# Patient Record
Sex: Female | Born: 1945 | Race: White | Hispanic: No | Smoking: Former smoker
Health system: Southern US, Community
[De-identification: ages and names within clinical notes are randomized; demographics above are authoritative.]

## PROBLEM LIST (undated history)

## (undated) DIAGNOSIS — M21611 Bunion of right foot: Secondary | ICD-10-CM

## (undated) DIAGNOSIS — Z85820 Personal history of malignant melanoma of skin: Secondary | ICD-10-CM

## (undated) DIAGNOSIS — M21612 Bunion of left foot: Secondary | ICD-10-CM

## (undated) DIAGNOSIS — E785 Hyperlipidemia, unspecified: Secondary | ICD-10-CM

## (undated) DIAGNOSIS — G4700 Insomnia, unspecified: Secondary | ICD-10-CM

## (undated) DIAGNOSIS — L719 Rosacea, unspecified: Secondary | ICD-10-CM

## (undated) DIAGNOSIS — M7540 Impingement syndrome of unspecified shoulder: Secondary | ICD-10-CM

## (undated) DIAGNOSIS — G479 Sleep disorder, unspecified: Secondary | ICD-10-CM

## (undated) DIAGNOSIS — K5120 Ulcerative (chronic) proctitis without complications: Secondary | ICD-10-CM

## (undated) DIAGNOSIS — C439 Malignant melanoma of skin, unspecified: Secondary | ICD-10-CM

## (undated) DIAGNOSIS — R32 Unspecified urinary incontinence: Secondary | ICD-10-CM

## (undated) DIAGNOSIS — M1990 Unspecified osteoarthritis, unspecified site: Secondary | ICD-10-CM

## (undated) DIAGNOSIS — I10 Essential (primary) hypertension: Secondary | ICD-10-CM

## (undated) DIAGNOSIS — K529 Noninfective gastroenteritis and colitis, unspecified: Secondary | ICD-10-CM

## (undated) DIAGNOSIS — M159 Polyosteoarthritis, unspecified: Secondary | ICD-10-CM

## (undated) DIAGNOSIS — K5100 Ulcerative (chronic) pancolitis without complications: Secondary | ICD-10-CM

## (undated) DIAGNOSIS — I890 Lymphedema, not elsewhere classified: Secondary | ICD-10-CM

## (undated) HISTORY — DX: Polyosteoarthritis, unspecified: M15.9

## (undated) HISTORY — DX: Unspecified urinary incontinence: R32

## (undated) HISTORY — PX: TONSILLECTOMY: SUR1361

## (undated) HISTORY — PX: BREAST BIOPSY: SHX20

## (undated) HISTORY — DX: Rosacea, unspecified: L71.9

## (undated) HISTORY — DX: Personal history of malignant melanoma of skin: Z85.820

## (undated) HISTORY — PX: ABDOMINAL HYSTERECTOMY: SHX81

## (undated) HISTORY — PX: CATARACT EXTRACTION W/ INTRAOCULAR LENS IMPLANT: SHX1309

## (undated) HISTORY — PX: OOPHORECTOMY: SHX86

## (undated) HISTORY — DX: Impingement syndrome of unspecified shoulder: M75.40

## (undated) HISTORY — DX: Bunion of left foot: M21.611

## (undated) HISTORY — PX: MELANOMA EXCISION: SHX5266

## (undated) HISTORY — DX: Sleep disorder, unspecified: G47.9

## (undated) HISTORY — DX: Bunion of right foot: M21.612

## (undated) HISTORY — PX: KNEE SURGERY: SHX244

## (undated) HISTORY — DX: Essential (primary) hypertension: I10

---
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
## 1974-11-27 HISTORY — PX: TUBAL LIGATION: SHX77

## 1987-11-28 HISTORY — PX: ABDOMINAL HYSTERECTOMY: SHX81

## 1987-11-28 HISTORY — PX: HYSTEROTOMY: SHX1776

## 1998-11-27 HISTORY — PX: MELANOMA EXCISION: SHX5266

## 2003-11-28 HISTORY — PX: ARTHROSCOPIC REPAIR ACL: SUR80

## 2010-02-06 ENCOUNTER — Ambulatory Visit: Payer: Self-pay | Admitting: Internal Medicine

## 2010-11-27 HISTORY — PX: OOPHORECTOMY: SHX86

## 2011-05-03 ENCOUNTER — Ambulatory Visit: Payer: Self-pay | Admitting: Ophthalmology

## 2011-06-14 ENCOUNTER — Ambulatory Visit: Payer: Self-pay | Admitting: Ophthalmology

## 2011-09-13 DIAGNOSIS — C439 Malignant melanoma of skin, unspecified: Secondary | ICD-10-CM

## 2011-09-13 HISTORY — DX: Malignant melanoma of skin, unspecified: C43.9

## 2013-03-17 DIAGNOSIS — M79673 Pain in unspecified foot: Secondary | ICD-10-CM | POA: Insufficient documentation

## 2016-07-14 DIAGNOSIS — M1712 Unilateral primary osteoarthritis, left knee: Secondary | ICD-10-CM | POA: Insufficient documentation

## 2016-11-27 HISTORY — PX: COLONOSCOPY: SHX5424

## 2017-01-13 ENCOUNTER — Ambulatory Visit
Admission: EM | Admit: 2017-01-13 | Discharge: 2017-01-13 | Disposition: A | Discharge status: Home / Self Care | Payer: Medicare Other | Attending: Family Medicine | Admitting: Family Medicine

## 2017-01-13 ENCOUNTER — Encounter: Payer: Self-pay | Admitting: Gynecology

## 2017-01-13 DIAGNOSIS — R05 Cough: Secondary | ICD-10-CM | POA: Diagnosis not present

## 2017-01-13 DIAGNOSIS — J0100 Acute maxillary sinusitis, unspecified: Secondary | ICD-10-CM

## 2017-01-13 DIAGNOSIS — R059 Cough, unspecified: Secondary | ICD-10-CM

## 2017-01-13 HISTORY — DX: Noninfective gastroenteritis and colitis, unspecified: K52.9

## 2017-01-13 HISTORY — DX: Insomnia, unspecified: G47.00

## 2017-01-13 HISTORY — DX: Ulcerative (chronic) proctitis without complications: K51.20

## 2017-01-13 HISTORY — DX: Malignant melanoma of skin, unspecified: C43.9

## 2017-01-13 HISTORY — DX: Hyperlipidemia, unspecified: E78.5

## 2017-01-13 HISTORY — DX: Unspecified osteoarthritis, unspecified site: M19.90

## 2017-01-13 MED ORDER — AMOXICILLIN 875 MG PO TABS: 875.0000 mg | ORAL_TABLET | Freq: Two times a day (BID) | ORAL | 0 refills | Status: DC

## 2017-01-13 NOTE — ED Provider Notes (Signed)
[facility]    CSN: [id number] Arrival date & time: 01/13/17  1125     History   Chief Complaint Chief Complaint  Patient presents with  . Cough    HPI Barbara Burgess is a 71 y.o. female.   The history is provided by the patient.  URI  Presenting symptoms: congestion, cough and fatigue   Severity:  Moderate Onset quality:  Sudden Duration:  2 weeks Timing:  Constant Progression:  Worsening Chronicity:  New Relieved by:  Nothing Ineffective treatments:  OTC medications Associated symptoms: headaches and sinus pain   Associated symptoms: no arthralgias, no myalgias and no wheezing   Risk factors: being elderly and sick contacts   Risk factors: no chronic cardiac disease, no chronic kidney disease, no chronic respiratory disease, no diabetes mellitus, no immunosuppression, no recent illness and no recent travel     Past Medical History:  Diagnosis Date  . Arthritis    left knee  . 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 Colitis   . Head ache   . Hyperlipemia   . Insomnia   . Melanoma ([place])   . Palpitation     There are no active problems to display for this patient.   Past Surgical History:  Procedure Laterality Date  . ABDOMINAL HYSTERECTOMY    . KNEE SURGERY    . MELANOMA EXCISION    . OOPHORECTOMY    . TONSILLECTOMY      OB History    No data available       Home Medications    Prior to Admission medications   Medication Sig Start Date End Date Taking? Authorizing Provider  aspirin EC 81 MG tablet Take 81 mg by mouth daily.   Yes Historical Provider, MD  diclofenac sodium (VOLTAREN) 1 % GEL Apply topically 4 (four) times daily.   Yes Historical Provider, MD  diphenhydrAMINE (BENADRYL ALLERGY) 25 mg capsule Take 25 mg by mouth every 6 (six) hours as needed.   Yes Historical Provider, MD  fluticasone (FLONASE) 50 MCG/ACT nasal spray Place into both nostrils daily.   Yes Historical Provider, MD  hydrochlorothiazide (HYDRODIURIL) 25  MG tablet Take 25 mg by mouth daily.   Yes Historical Provider, MD  mesalamine (CANASA) 1000 MG suppository Place 1,000 mg rectally at bedtime.   Yes Historical Provider, MD  metroNIDAZOLE (METROCREAM) 0.75 % cream Apply topically 2 (two) times daily.   Yes Historical Provider, MD  omega-3 acid ethyl esters (LOVAZA) 1 g capsule Take by mouth 2 (two) times daily.   Yes Historical Provider, MD  pravastatin (PRAVACHOL) 20 MG tablet Take 20 mg by mouth daily.   Yes Historical Provider, MD  Probiotic Product (ALIGN) 4 MG CAPS Take by mouth.   Yes Historical Provider, MD  traZODone (DESYREL) 100 MG tablet Take 100 mg by mouth at bedtime.   Yes Historical Provider, MD  amoxicillin (AMOXIL) 875 MG tablet Take 1 tablet (875 mg total) by mouth 2 (two) times daily. 01/13/17   Norval Gable, MD    Family History No family history on file.  Social History Social History  Substance Use Topics  . Smoking status: Never Smoker  . Smokeless tobacco: Never Used  . Alcohol use No     Allergies   Atorvastatin   Review of Systems Review of Systems  Constitutional: Positive for fatigue.  HENT: Positive for congestion and sinus pain.   Respiratory: Positive for cough. Negative for wheezing.   Musculoskeletal: Negative for arthralgias and  myalgias.  Neurological: Positive for headaches.     Physical Exam Triage Vital Signs ED Triage Vitals  Enc Vitals Group     BP 01/13/17 1151 122/69     Pulse Rate 01/13/17 1151 74     Resp 01/13/17 1151 16     Temp 01/13/17 1151 97.7 F (36.5 C)     Temp Source 01/13/17 1151 Oral     SpO2 01/13/17 1151 97 %     Weight 01/13/17 1153 148 lb (67.1 kg)     Height 01/13/17 1153 5' 4"  (1.626 m)     Head Circumference --      Peak Flow --      Pain Score 01/13/17 1200 2     Pain Loc --      Pain Edu? --      Excl. in [place]? --    No data found.   Updated Vital Signs BP 122/69 (BP Location: Left Arm)   Pulse 74   Temp 97.7 F (36.5 C) (Oral)   Resp 16    Ht 5' 4"  (1.626 m)   Wt 148 lb (67.1 kg)   SpO2 97%   BMI 25.40 kg/m   Visual Acuity Right Eye Distance:   Left Eye Distance:   Bilateral Distance:    Right Eye Near:   Left Eye Near:    Bilateral Near:     Physical Exam  Constitutional: She appears well-developed and well-nourished. No distress.  HENT:  Head: Normocephalic and atraumatic.  Right Ear: Tympanic membrane, external ear and ear canal normal.  Left Ear: Tympanic membrane, external ear and ear canal normal.  Nose: Mucosal edema and rhinorrhea present. No nose lacerations, sinus tenderness, nasal deformity, septal deviation or nasal septal hematoma. No epistaxis.  No foreign bodies. Right sinus exhibits maxillary sinus tenderness and frontal sinus tenderness. Left sinus exhibits maxillary sinus tenderness and frontal sinus tenderness.  Mouth/Throat: Uvula is midline, oropharynx is clear and moist and mucous membranes are normal. No oropharyngeal exudate.  Eyes: Conjunctivae and EOM are normal. Pupils are equal, round, and reactive to light. Right eye exhibits no discharge. Left eye exhibits no discharge. No scleral icterus.  Neck: Normal range of motion. Neck supple. No thyromegaly present.  Cardiovascular: Normal rate, regular rhythm and normal heart sounds.   Pulmonary/Chest: Effort normal and breath sounds normal. No respiratory distress. She has no wheezes. She has no rales.  Lymphadenopathy:    She has no cervical adenopathy.  Skin: She is not diaphoretic.  Nursing note and vitals reviewed.    UC Treatments / Results  Labs (all labs ordered are listed, but only abnormal results are displayed) Labs Reviewed - No data to display  EKG  EKG Interpretation None       Radiology No results found.  Procedures Procedures (including critical care time)  Medications Ordered in UC Medications - No data to display   Initial Impression / Assessment and Plan / UC Course  I have reviewed the triage vital signs  and the nursing notes.  Pertinent labs & imaging results that were available during my care of the patient were reviewed by me and considered in my medical decision making (see chart for details).       Final Clinical Impressions(s) / UC Diagnoses   Final diagnoses:  Acute maxillary sinusitis, recurrence not specified  Cough    New Prescriptions Discharge Medication List as of 01/13/2017 12:16 PM    START taking these medications   Details  amoxicillin (  AMOXIL) 875 MG tablet Take 1 tablet (875 mg total) by mouth 2 (two) times daily., Starting Sat 01/13/2017, Normal       1. diagnosis reviewed with patient 2. rx as per orders above; reviewed possible side effects, interactions, risks and benefits  3. Recommend supportive treatment with rest, fluids 4. Follow-up prn if symptoms worsen or don't improve   Norval Gable, MD 01/13/17 1229

## 2017-01-13 NOTE — ED Triage Notes (Signed)
Patient c/o cough / chest congestion / feeling week/ fatigue x couple weeks.

## 2017-11-27 HISTORY — PX: COLONOSCOPY: SHX174

## 2018-11-27 DIAGNOSIS — Z9289 Personal history of other medical treatment: Secondary | ICD-10-CM

## 2018-11-27 HISTORY — DX: Personal history of other medical treatment: Z92.89

## 2018-12-04 ENCOUNTER — Encounter: Payer: Self-pay | Admitting: Internal Medicine

## 2018-12-12 DIAGNOSIS — R911 Solitary pulmonary nodule: Secondary | ICD-10-CM

## 2018-12-12 HISTORY — DX: Solitary pulmonary nodule: R91.1

## 2018-12-20 ENCOUNTER — Encounter: Payer: Self-pay | Admitting: Internal Medicine

## 2018-12-20 ENCOUNTER — Ambulatory Visit (INDEPENDENT_AMBULATORY_CARE_PROVIDER_SITE_OTHER): Payer: Medicare PPO | Admitting: Internal Medicine

## 2018-12-20 VITALS — BP 130/70 | HR 75 | Temp 97.6°F | Ht 64.25 in | Wt 154.0 lb

## 2018-12-20 DIAGNOSIS — Z85820 Personal history of malignant melanoma of skin: Secondary | ICD-10-CM

## 2018-12-20 DIAGNOSIS — K5120 Ulcerative (chronic) proctitis without complications: Secondary | ICD-10-CM | POA: Insufficient documentation

## 2018-12-20 DIAGNOSIS — I10 Essential (primary) hypertension: Secondary | ICD-10-CM | POA: Diagnosis not present

## 2018-12-20 DIAGNOSIS — G479 Sleep disorder, unspecified: Secondary | ICD-10-CM

## 2018-12-20 DIAGNOSIS — M159 Polyosteoarthritis, unspecified: Secondary | ICD-10-CM | POA: Insufficient documentation

## 2018-12-20 DIAGNOSIS — M150 Primary generalized (osteo)arthritis: Secondary | ICD-10-CM

## 2018-12-20 DIAGNOSIS — E785 Hyperlipidemia, unspecified: Secondary | ICD-10-CM

## 2018-12-20 NOTE — Progress Notes (Signed)
Subjective:    Patient ID: Barbara Burgess, female    DOB: 1946/05/02, 73 y.o.   MRN: [id number]  HPI Here to establish care Will be moving to [facility] in 2 months---currently in [place]  Had advanced melanoma in 2012 Had stage 1 melanoma in 2000 Then 2012 had abdominal mass---felt to be recurrence of melanoma Had surgery then RT No clear evidence of recurrence Follows with Dr Harriet Masson  Known HTN On medications for that --- 15 years or so  Primary prevention with statin No myalgias or GI problems LFT's did go up on atorvastatin  Sleep issues chronically Trazodone helps  Ulcerative colitis for about 10 years Mild at first----mesalamine suppositories worked but then the price went sky high Tried off it---then bad flare last June Now on mesalamine orally Mostly had urgency, frequency and blood---all better Has to be careful with what she eats  Mild arthritis Mostly in knees and shoulders Recent PT for shoulders Exercises regularly ---swim, water aerobics, weights  Current Outpatient Medications on File Prior to Visit  Medication Sig Dispense Refill  . amLODipine (NORVASC) 5 MG tablet Take 1 tablet by mouth daily.    Marland Kitchen b complex vitamins tablet Take 1 tablet by mouth daily.    . diclofenac sodium (VOLTAREN) 1 % GEL Apply topically 4 (four) times daily.    . diphenhydrAMINE (BENADRYL ALLERGY) 25 mg capsule Take 25 mg by mouth every 6 (six) hours as needed.    . fluticasone (FLONASE) 50 MCG/ACT nasal spray Place into both nostrils daily.    . mesalamine (LIALDA) 1.2 g EC tablet Take 4 tablets by mouth daily.    . metroNIDAZOLE (METROCREAM) 0.75 % cream Apply topically 2 (two) times daily.    . pravastatin (PRAVACHOL) 40 MG tablet Take 1 tablet by mouth daily.    . Probiotic Product (ALIGN) 4 MG CAPS Take by mouth.    . traZODone (DESYREL) 100 MG tablet Take 100 mg by mouth at bedtime.    . TURMERIC PO Take by mouth.     No current facility-administered medications on  file prior to visit.     Allergies  Allergen Reactions  . Atorvastatin     Past Medical History:  Diagnosis Date  . Arthritis    left knee  . 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 Colitis   . History of melanoma   . Hyperlipemia   . Insomnia   . Melanoma [facility])     Past Surgical History:  Procedure Laterality Date  . ABDOMINAL HYSTERECTOMY    . KNEE SURGERY    . MELANOMA EXCISION    . OOPHORECTOMY     along with melanoma mass  . TONSILLECTOMY      History reviewed. No pertinent family history.  Social History   Socioeconomic History  . Marital status: Widowed    Spouse name: Not on file  . Number of children: 2  . Years of education: Not on file  . Highest education level: Not on file  Occupational History  . 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 Financial resource strain: Not on file  . Food insecurity:    Worry: Not on file    Inability: Not on file  . Transportation needs:    Medical: Not on file    Non-medical: Not on file  Tobacco Use  . Smoking status: Never Smoker  . Smokeless tobacco: Never Used  Substance and Sexual Activity  . Alcohol use: Not  on file    Comment: occasional alcohol  . Drug use: Not on file  . Sexual activity: Not on file  Lifestyle  . Physical activity:    Days per week: Not on file    Minutes per session: Not on file  . Stress: Not on file  Relationships  . Social connections:    Talks on phone: Not on file    Gets together: Not on file    Attends religious service: Not on file    Active member of club or organization: Not on file    Attends meetings of clubs or organizations: Not on file    Relationship status: Not on file  . Intimate partner violence:    Fear of current or ex partner: Not on file    Emotionally abused: Not on file    Physically abused: Not on file    Forced sexual activity: Not on file  Other Topics Concern  . Not on file  Social History Narrative  . Not on file    Review of Systems  Constitutional: Negative for fatigue.       Had lost 40# after husband died--stable now Wears seat belt  HENT: Negative for dental problem and hearing loss.   Eyes: Negative for visual disturbance.       No diplopia or unilateral vision loss  Respiratory: Negative for cough, chest tightness and shortness of breath.   Cardiovascular: Negative for chest pain, palpitations and leg swelling.  Gastrointestinal: Negative for abdominal pain.       No heartburn  Endocrine: Negative for polydipsia and polyuria.  Genitourinary: Negative for difficulty urinating and hematuria.  Musculoskeletal: Positive for arthralgias and back pain. Negative for joint swelling.       Bunions and hammer toes  Skin: Negative for rash.  Allergic/Immunologic: Negative for environmental allergies and immunocompromised state.  Neurological: Negative for dizziness, syncope, light-headedness and headaches.  Hematological: Negative for adenopathy. Does not bruise/bleed easily.  Psychiatric/Behavioral: Negative for dysphoric mood. The patient is not nervous/anxious.        Some sleep issues with upcoming move       Objective:   Physical Exam  Constitutional: She is oriented to person, place, and time. She appears well-developed. No distress.  HENT:  Mouth/Throat: Oropharynx is clear and moist. No oropharyngeal exudate.  Neck: No thyromegaly present.  Cardiovascular: Normal rate, regular rhythm, normal heart sounds and intact distal pulses. Exam reveals no gallop.  No murmur heard. Respiratory: Effort normal and breath sounds normal. No respiratory distress. She has no wheezes. She has no rales.  GI: Soft. There is no abdominal tenderness.  Musculoskeletal:        General: No tenderness or edema.  Lymphadenopathy:    She has no cervical adenopathy.  Neurological: She is alert and oriented to person, place, and time.  Skin: No rash noted. No erythema.  Psychiatric: She has a normal mood and  affect. Her behavior is normal.           Assessment & Plan:

## 2018-12-20 NOTE — Assessment & Plan Note (Signed)
Statin for primary prevention

## 2018-12-20 NOTE — Assessment & Plan Note (Signed)
Regular follow up with Dr Harriet Masson

## 2018-12-20 NOTE — Assessment & Plan Note (Signed)
BP Readings from Last 3 Encounters:  12/20/18 130/70  01/13/17 122/69   Good control Recent blood work

## 2018-12-20 NOTE — Assessment & Plan Note (Signed)
Doing well now on the mesalamine

## 2018-12-20 NOTE — Assessment & Plan Note (Signed)
Does okay with the trazodone Takes benedryl also--counseled about this

## 2018-12-20 NOTE — Assessment & Plan Note (Signed)
Mild in hands/shoulders

## 2019-02-17 MED ORDER — PRAVASTATIN SODIUM 40 MG PO TABS: 40.0000 mg | ORAL_TABLET | Freq: Every day | ORAL | 3 refills | Status: DC

## 2019-02-19 MED ORDER — AMLODIPINE BESYLATE 5 MG PO TABS: 5.0000 mg | ORAL_TABLET | Freq: Every day | ORAL | 3 refills | Status: DC

## 2019-03-14 MED ORDER — TRAZODONE HCL 100 MG PO TABS: 100.0000 mg | ORAL_TABLET | Freq: Every day | ORAL | 3 refills | Status: DC

## 2019-05-02 ENCOUNTER — Other Ambulatory Visit: Payer: Self-pay | Admitting: Internal Medicine

## 2019-05-02 DIAGNOSIS — Z1231 Encounter for screening mammogram for malignant neoplasm of breast: Secondary | ICD-10-CM

## 2019-06-05 ENCOUNTER — Other Ambulatory Visit: Payer: Self-pay

## 2019-06-05 ENCOUNTER — Ambulatory Visit
Admission: RE | Admit: 2019-06-05 | Discharge: 2019-06-05 | Disposition: A | Discharge status: Home / Self Care | Payer: Medicare PPO | Source: Ambulatory Visit | Attending: Internal Medicine | Admitting: Internal Medicine

## 2019-06-05 DIAGNOSIS — Z1231 Encounter for screening mammogram for malignant neoplasm of breast: Secondary | ICD-10-CM | POA: Diagnosis present

## 2019-12-04 ENCOUNTER — Telehealth: Payer: Self-pay | Admitting: Internal Medicine

## 2019-12-04 NOTE — Telephone Encounter (Signed)
Patient called to cancel her cpx at the end of this month.  Patient said she's switching to [facility].

## 2019-12-04 NOTE — Telephone Encounter (Signed)
That is fine They are my successor group as Market researcher and have an NP on campus several times a week

## 2019-12-09 ENCOUNTER — Ambulatory Visit: Payer: Self-pay | Admitting: Nurse Practitioner

## 2019-12-11 ENCOUNTER — Ambulatory Visit: Payer: Self-pay | Admitting: Nurse Practitioner

## 2019-12-16 ENCOUNTER — Ambulatory Visit: Payer: Medicare PPO | Admitting: Nurse Practitioner

## 2019-12-16 ENCOUNTER — Other Ambulatory Visit: Payer: Self-pay

## 2019-12-16 ENCOUNTER — Encounter: Payer: Self-pay | Admitting: Nurse Practitioner

## 2019-12-16 VITALS — BP 118/76 | HR 89 | Temp 97.8°F | Resp 18 | Ht 64.0 in | Wt 158.0 lb

## 2019-12-16 DIAGNOSIS — I10 Essential (primary) hypertension: Secondary | ICD-10-CM

## 2019-12-16 DIAGNOSIS — E2839 Other primary ovarian failure: Secondary | ICD-10-CM

## 2019-12-16 DIAGNOSIS — L719 Rosacea, unspecified: Secondary | ICD-10-CM | POA: Diagnosis not present

## 2019-12-16 DIAGNOSIS — K51819 Other ulcerative colitis with unspecified complications: Secondary | ICD-10-CM | POA: Diagnosis not present

## 2019-12-16 DIAGNOSIS — M1990 Unspecified osteoarthritis, unspecified site: Secondary | ICD-10-CM | POA: Insufficient documentation

## 2019-12-16 DIAGNOSIS — F5101 Primary insomnia: Secondary | ICD-10-CM | POA: Diagnosis not present

## 2019-12-16 DIAGNOSIS — E785 Hyperlipidemia, unspecified: Secondary | ICD-10-CM

## 2019-12-16 DIAGNOSIS — M159 Polyosteoarthritis, unspecified: Secondary | ICD-10-CM

## 2019-12-16 DIAGNOSIS — M8949 Other hypertrophic osteoarthropathy, multiple sites: Secondary | ICD-10-CM

## 2019-12-16 DIAGNOSIS — Z85820 Personal history of malignant melanoma of skin: Secondary | ICD-10-CM

## 2019-12-16 DIAGNOSIS — G4700 Insomnia, unspecified: Secondary | ICD-10-CM | POA: Insufficient documentation

## 2019-12-16 NOTE — Progress Notes (Signed)
Careteam: Patient Care Team: Lauree Chandler, NP as PCP - General (Geriatric Medicine) Audrie Lia, MD as Referring Physician (Dermatology) Brain Hilts, MD as Referring Physician (Gastroenterology) [facility], [facility])  Advanced Directive information Does Patient Have a Medical Advance Directive?: Yes, Type of Advance Directive: [place];Living will, Does patient want to make changes to medical advance directive?: No - Patient declined  Allergies  Allergen Reactions  . Atorvastatin     Elevated Liver Enzymes.     Chief Complaint  Patient presents with  . Establish Care    New Patient     HPI: Patient is a 74 y.o. female seen in today at the [facility] to establish care.   Ulcerative coloitis- originally was mild-moderate but ~3 years ago became severe usually controlled, follows with GI. Currently taking mesalamine 1.2 mg tablet 4 tablets daily.  Getting colonoscopy yearly   Rosaceas- controlled metronidazole cream daily, prescribed by dermatologist.    Hyperlipidemia- pravastatin 40 mg daily - has been a least a year since checked, she reports cholesterol has been controlled in the past.   Hypertension- controlled on norvasc 5 mg daily   Insomnia- controlled on trazodone 100 mg daily and benadryl 25 mg daily.   Supplement- uses turmeric for inflammation   Shoulder impingement/left hip/OA- alternates tylenol with advil, following with ortho, going through PT.  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   Lived in [place] [facility] years  Melanoma, stage 4- originally had stage 1 melaoma in 2000, then in 2012 found melanoma on the ovary. Completed radiation therapy. Having chest and abdominal scans yearly but now feels like she is far enough out that they are not doing it.  following with oncologist and dermatologist.   Lost 40 lbs after her husband died (had put  on some after he passed) had more trouble in her knees so decided to change diet and lose weight. Moved to [place] and gained weight. On the [place] again and UC flared, did steroids and that calmed it down then flared again. She decided she needed to change diet again which has helped. In the last 2 weeks has been very strict abt limiting foods.  No abdominal pain, no blood.    Review of Systems:  Review of Systems  Constitutional: Negative for chills, fever and weight loss.  Respiratory: Negative for cough, sputum production and shortness of breath.   Cardiovascular: Negative for chest pain, palpitations and leg swelling.  Gastrointestinal: Negative for abdominal pain, constipation, diarrhea and heartburn.  Genitourinary: Negative for dysuria, frequency and urgency.  Musculoskeletal: Positive for back pain, joint pain and myalgias. Negative for falls.  Skin: Negative.   Neurological: Negative for dizziness and headaches.  Psychiatric/Behavioral: Negative for depression and memory loss. The patient does not have insomnia.     Past Medical History:  Diagnosis Date  . Arthritis    knee and hip,  per [facility] New Patient Packet.   . Bilateral bunions   . 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   . Colitis   . H/O CT scan of chest 11/27/2018   By Berle Mull, per [facility] New Patient Packet.   . H/O mammogram 11/27/2018   By Arvilla Market at [facility], per [facility] New Patient Packet.   Marland Kitchen History of CT scan of abdomen 11/27/2018   By Joaquim Lai  Collichio, per [facility] New Patient Packet.   Marland Kitchen History of melanoma     per [facility] New Patient Packet.   Marland Kitchen History of upper extremity x-ray 11/27/2018   By Bridgette Habermann, of Shoulder, per [facility] New Patient Packet.   Marland Kitchen History of x-ray of hip 11/27/2018   By Bridgette Habermann, of Hip, per [facility] New Patient Packet.   . Hyperlipemia     per [facility] New Patient Packet.   . Hypertension     per [facility] New Patient Packet.   . Impingement  syndrome of shoulder     per [facility] New Patient Packet.   . Incontinence    Mild Nighttime per [facility] New Patient Packet.  . Insomnia   . Melanoma ([place])   . Osteoarthritis of multiple joints   . Rosacea     per [facility] New Patient Packet.   . Sleep disorder    Past Surgical History:  Procedure Laterality Date  . ABDOMINAL HYSTERECTOMY  11/28/1987   Partial per [facility] New Patient Packet  . ARTHROSCOPIC REPAIR ACL Left 11/28/2003   Left Knee/ Torn Medial Meniscus per [facility] New Patient Packet  . BREAST BIOPSY    . CATARACT EXTRACTION W/ INTRAOCULAR LENS IMPLANT Bilateral   . COLONOSCOPY  11/27/2016   By Elspeth Cho,  per [facility] New Patient Packet.   . COLONOSCOPY  11/27/2017   By Elspeth Cho, per [facility] New Patient Packet.   Marland Kitchen HYSTEROTOMY  11/28/1987   per [facility] New Patient Packet  . KNEE SURGERY    . MELANOMA EXCISION  11/27/1998   Right Shin per [facility] New Patient Packet  . OOPHORECTOMY  11/27/2010   along with melanoma mass per [facility] New Patient Packet  . TONSILLECTOMY  11/27/1950   per [facility] New Patient Packet   Social History:   reports that she quit smoking about 30 years ago. Her smoking use included cigarettes. She smoked 1.00 pack per day. She has never used smokeless tobacco. She reports current alcohol use of about 4.0 - 5.0 standard drinks of alcohol per week. She reports that she does not use drugs.  Family History  Problem Relation Age of Onset  . Alzheimer's disease Mother   . Heart disease Father   . Lung cancer Maternal Grandfather   . Heart attack Paternal Grandfather   . Diabetes Neg Hx     Medications: Patient's Medications  New Prescriptions   No medications on file  Previous Medications   AMLODIPINE (NORVASC) 5 MG TABLET    Take 1 tablet (5 mg total) by mouth daily.   B COMPLEX VITAMINS TABLET    Take 1 tablet by mouth daily.   DICLOFENAC SODIUM (VOLTAREN) 1 % GEL    Apply topically as needed.    DIPHENHYDRAMINE (BENADRYL ALLERGY) 25 MG CAPSULE    Take 25 mg by mouth  daily.    IBUPROFEN PO    Take by mouth as needed.   MESALAMINE PO    Take 4.8 g by mouth daily.   METRONIDAZOLE (METROCREAM) 0.75 % CREAM    Apply 1 application topically daily.    PRAVASTATIN (PRAVACHOL) 40 MG TABLET    Take 1 tablet (40 mg total) by mouth daily.   TRAZODONE (DESYREL) 100 MG TABLET    Take 1 tablet (100 mg total) by mouth at bedtime.   TURMERIC 500 MG CAPS    Take 1 capsule by mouth 2 (two) times daily.  Modified Medications   No medications on file  Discontinued Medications  No medications on file    Physical Exam:  Vitals:   12/16/19 1321  BP: 118/76  Pulse: 89  Resp: 18  Temp: 97.8 F (36.6 C)  SpO2: 98%  Weight: 158 lb (71.7 kg)  Height: 5' 4"  (1.626 m)   Body mass index is 27.12 kg/m. Wt Readings from Last 3 Encounters:  12/16/19 158 lb (71.7 kg)  12/20/18 154 lb (69.9 kg)  01/13/17 148 lb (67.1 kg)    Physical Exam Constitutional:      General: She is not in acute distress.    Appearance: She is well-developed. She is not diaphoretic.  HENT:     Head: Normocephalic and atraumatic.     Mouth/Throat:     Pharynx: No oropharyngeal exudate.  Eyes:     Conjunctiva/sclera: Conjunctivae normal.     Pupils: Pupils are equal, round, and reactive to light.  Cardiovascular:     Rate and Rhythm: Normal rate and regular rhythm.     Heart sounds: Normal heart sounds.  Pulmonary:     Effort: Pulmonary effort is normal.     Breath sounds: Normal breath sounds.  Abdominal:     General: Bowel sounds are normal.     Palpations: Abdomen is soft.  Musculoskeletal:        General: No tenderness.     Cervical back: Normal range of motion and neck supple.  Skin:    General: Skin is warm and dry.  Neurological:     Mental Status: She is alert and oriented to person, place, and time.     Labs reviewed: Basic Metabolic Panel: No results for input(s): NA, K, CL, CO2, GLUCOSE, BUN, CREATININE, CALCIUM, MG, PHOS, TSH in the last 8760 hours. Liver  Function Tests: No results for input(s): AST, ALT, ALKPHOS, BILITOT, PROT, ALBUMIN in the last 8760 hours. No results for input(s): LIPASE, AMYLASE in the last 8760 hours. No results for input(s): AMMONIA in the last 8760 hours. CBC: No results for input(s): WBC, NEUTROABS, HGB, HCT, MCV, PLT in the last 8760 hours. Lipid Panel: No results for input(s): CHOL, HDL, LDLCALC, TRIG, CHOLHDL, LDLDIRECT in the last 8760 hours. TSH: No results for input(s): TSH in the last 8760 hours. A1C: No results found for: HGBA1C   Assessment/Plan 1. Estrogen deficiency - DG Bone Density; Future  2. Hx of melanoma  -following routinely with oncologist and dermatologist, recently stop her yearly scans of abdomen and chest due to no recurrence.   3. Other ulcerative colitis with complication ([place]) -has modified diet which has helped symptoms. Following with GI.   4. Primary insomnia Has been taking trazodone with benadryl. To stop benadryl due to side effect.  5. Rosacea -followed by dermatology. Continues on metronidazole 0.75% cream.  6. Essential hypertension Controlled on Norvasc 5 mg by mouth daily   7. Primary osteoarthritis involving multiple joints Working with PT due to hip and shoulder pain. Follows with ortho. Advised to avoid NSAIDS   8. Hyperlipidemia, unspecified hyperlipidemia type Will follow up lipids and update CMP. Continues on pravastatin and dietary modifications.    Next appt: 6 weeks for AWV and EV Rabecka Brendel K. [place], [facility] [phone]

## 2019-12-18 LAB — BASIC METABOLIC PANEL
BUN: 15 (ref 4–21)
CO2: 24 — AB (ref 13–22)
Chloride: 107 (ref 99–108)
Creatinine: 0.7 (ref 0.5–1.1)
Glucose: 88
Potassium: 5.1 (ref 3.4–5.3)
Sodium: 143 (ref 137–147)

## 2019-12-18 LAB — CBC: RBC: 4.91 (ref 3.87–5.11)

## 2019-12-18 LAB — LIPID PANEL
Cholesterol: 229 — AB (ref 0–200)
LDL Cholesterol: 133
Triglycerides: 121 (ref 40–160)

## 2019-12-18 LAB — HEPATIC FUNCTION PANEL
ALT: 45 — AB (ref 7–35)
AST: 30 (ref 13–35)
Alkaline Phosphatase: 97 (ref 25–125)
Bilirubin, Total: 0.5

## 2019-12-18 LAB — CBC AND DIFFERENTIAL
HCT: 45 (ref 36–46)
Hemoglobin: 14.7 (ref 12.0–16.0)
Neutrophils Absolute: 2769
Platelets: 23 — AB (ref 150–399)
WBC: 5.1

## 2019-12-18 LAB — COMPREHENSIVE METABOLIC PANEL
Albumin: 4.3 (ref 3.5–5.0)
GFR calc Af Amer: 101
GFR calc non Af Amer: 87
Globulin: 2.1

## 2019-12-23 ENCOUNTER — Encounter: Payer: Medicare PPO | Admitting: Internal Medicine

## 2020-01-08 ENCOUNTER — Encounter: Payer: Self-pay | Admitting: Podiatry

## 2020-01-08 ENCOUNTER — Ambulatory Visit (INDEPENDENT_AMBULATORY_CARE_PROVIDER_SITE_OTHER): Payer: Medicare PPO | Admitting: Podiatry

## 2020-01-08 ENCOUNTER — Other Ambulatory Visit: Payer: Self-pay

## 2020-01-08 DIAGNOSIS — Q828 Other specified congenital malformations of skin: Secondary | ICD-10-CM | POA: Insufficient documentation

## 2020-01-08 DIAGNOSIS — M2010 Hallux valgus (acquired), unspecified foot: Secondary | ICD-10-CM

## 2020-01-08 NOTE — Progress Notes (Signed)
This patient presents to the office with chief complaint of a painful callus under the ball of her right foot.  She says she has developed this callus which is become increasingly painful for the last few months.  Patient states that she has moved from [place] and her previous podiatrist recommended callus care in addition to wearing orthoses.  Patient is not interested in surgical correction of her bunions.  She request treatment to eliminate pain from her callus right forefoot.  She is also interested in having her previous orthoses examined.    She states that she may need a new pair of orthoses.  Finally she does not need nail care today she presents the office today for evaluation and treatment of her right forefoot and orthoses.  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    Vascular  Dorsalis pedis and posterior tibial pulses are palpable  B/L.  Capillary return  WNL.  Temperature gradient is  WNL.  Skin turgor  WNL  Sensorium  Senn Weinstein monofilament wire  WNL. Normal tactile sensation.  Nail Exam  Patient has normal nails with no evidence of bacterial or fungal infection.  Orthopedic  Exam  Muscle tone and muscle strength  WNL.  No limitations of motion feet  B/L.  No crepitus or joint effusion noted.  Foot type is unremarkable and digits show no abnormalities.  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  Plantar flexed second metatarsal right foot.    Skin  No open lesions.  Normal skin texture and turgor.  Porokeratosis sub 2 right foot.  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  IE.  Debrided porokeratosis right forefoot.  Examined her orthoses which were made of Plastizote with a metatarsal pad for forefoot relief for her porokeratosis.  Told the patient that this insole was adequate but I believe in the near future she needs to consider a new orthoses with possibly a localized off weightbearing site subtwo right  foot.  RTC 4 months.   Gardiner Barefoot DPM

## 2020-01-27 ENCOUNTER — Ambulatory Visit (INDEPENDENT_AMBULATORY_CARE_PROVIDER_SITE_OTHER): Payer: Medicare PPO | Admitting: Nurse Practitioner

## 2020-01-27 ENCOUNTER — Other Ambulatory Visit: Payer: Self-pay

## 2020-01-27 ENCOUNTER — Encounter: Payer: Self-pay | Admitting: Nurse Practitioner

## 2020-01-27 VITALS — BP 118/76 | HR 103 | Temp 97.8°F | Resp 16 | Ht 64.0 in | Wt 160.0 lb

## 2020-01-27 VITALS — BP 118/76 | HR 72 | Temp 97.8°F | Resp 16 | Ht 64.0 in | Wt 160.0 lb

## 2020-01-27 DIAGNOSIS — Z0000 Encounter for general adult medical examination without abnormal findings: Secondary | ICD-10-CM

## 2020-01-27 DIAGNOSIS — I10 Essential (primary) hypertension: Secondary | ICD-10-CM

## 2020-01-27 DIAGNOSIS — F5101 Primary insomnia: Secondary | ICD-10-CM

## 2020-01-27 DIAGNOSIS — M8949 Other hypertrophic osteoarthropathy, multiple sites: Secondary | ICD-10-CM

## 2020-01-27 DIAGNOSIS — E785 Hyperlipidemia, unspecified: Secondary | ICD-10-CM

## 2020-01-27 DIAGNOSIS — L719 Rosacea, unspecified: Secondary | ICD-10-CM

## 2020-01-27 DIAGNOSIS — M150 Primary generalized (osteo)arthritis: Secondary | ICD-10-CM

## 2020-01-27 DIAGNOSIS — M159 Polyosteoarthritis, unspecified: Secondary | ICD-10-CM

## 2020-01-27 MED ORDER — AMLODIPINE BESYLATE 5 MG PO TABS: 5.0000 mg | ORAL_TABLET | Freq: Every day | ORAL | 3 refills | Status: DC

## 2020-01-27 MED ORDER — TRAZODONE HCL 100 MG PO TABS: 100.0000 mg | ORAL_TABLET | Freq: Every day | ORAL | 3 refills | Status: AC

## 2020-01-27 MED ORDER — PRAVASTATIN SODIUM 40 MG PO TABS: 40.0000 mg | ORAL_TABLET | Freq: Every day | ORAL | 3 refills | Status: DC

## 2020-01-27 NOTE — Patient Instructions (Signed)
Barbara Burgess , Thank you for taking time to come for your Medicare Wellness Visit. I appreciate your ongoing commitment to your health goals. Please review the following plan we discussed and let me know if I can assist you in the future.   Screening recommendations/referrals: Colonoscopy - through GI Mammogram up to date Bone Density orders on file- will follow up with this.  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 What does preventive care include?  A yearly physical exam. This is also called an annual well check.  Dental exams once or twice a year.  Routine eye exams. Ask your health care provider how often you should have your eyes checked.  Personal lifestyle choices, including:  Daily care of your teeth and gums.  Regular physical activity.  Eating a healthy diet.  Avoiding tobacco and drug use.  Limiting alcohol use.  Practicing safe sex.  Taking low-dose aspirin every day.  Taking vitamin and mineral supplements as recommended by your health care provider. What happens during an annual well check? The services and screenings done by your health care provider during your annual well check will depend on your age, overall health, lifestyle risk factors, and family history of disease. Counseling  Your health care provider may ask you questions about your:  Alcohol use.  Tobacco use.  Drug use.  Emotional  well-being.  Home and relationship well-being.  Sexual activity.  Eating habits.  History of falls.  Memory and ability to understand (cognition).  Work and work Statistician.  Reproductive health. Screening  You may have the following tests or measurements:  Height, weight, and BMI.  Blood pressure.  Lipid and cholesterol levels. These may be checked every 5 years, or more frequently if you are over 54 years old.  Skin check.  Lung cancer screening. You may have this screening every year starting at age 31 if you have a 30-pack-year history of smoking and currently smoke or have quit within the past 15 years.  Fecal occult blood test (FOBT) of the stool. You may have this test every year starting at age 28.  Flexible sigmoidoscopy or colonoscopy. You may have a sigmoidoscopy every 5 years or a colonoscopy every 10 years starting at age 7.  Hepatitis C blood test.  Hepatitis B blood test.  Sexually transmitted disease (STD) testing.  Diabetes screening. This is done by checking your blood sugar (glucose) after you have not eaten for a while (fasting). You may have this done every 1-3 years.  Bone density scan. This is done to screen for osteoporosis. You may have this done starting at age 2.  Mammogram. This may be done every 1-2 years. Talk to your health care provider about how often you should have regular mammograms. Talk with your health care provider about your test results, treatment options, and if necessary, the need for more tests. Vaccines  Your health care provider may recommend certain vaccines, such as:  Influenza vaccine. This is recommended every year.  Tetanus, diphtheria,  and acellular pertussis (Tdap, Td) vaccine. You may need a Td booster every 10 years.  Zoster vaccine. You may need this after age 47.  Pneumococcal 13-valent conjugate (PCV13) vaccine. One dose is recommended after age 24.  Pneumococcal polysaccharide (PPSV23) vaccine. One  dose is recommended after age 25. Talk to your health care provider about which screenings and vaccines you need and how often you need them. This information is not intended to replace advice given to you by your health care provider. Make sure you discuss any questions you have with your health care provider. Document Released: 12/10/2015 Document Revised: 08/02/2016 Document Reviewed: 09/14/2015 Elsevier Interactive Patient Education  2017 [employer] Prevention in the Home Falls can cause injuries. They can happen to people of all ages. There are many things you can do to make your home safe and to help prevent falls. What can I do on the outside of my home?  Regularly fix the edges of walkways and driveways and fix any cracks.  Remove anything that might make you trip as you walk through a door, such as a raised step or threshold.  Trim any bushes or trees on the path to your home.  Use bright outdoor lighting.  Clear any walking paths of anything that might make someone trip, such as rocks or tools.  Regularly check to see if handrails are loose or broken. Make sure that both sides of any steps have handrails.  Any raised decks and porches should have guardrails on the edges.  Have any leaves, snow, or ice cleared regularly.  Use sand or salt on walking paths during winter.  Clean up any spills in your garage right away. This includes oil or grease spills. What can I do in the bathroom?  Use night lights.  Install grab bars by the toilet and in the tub and shower. Do not use towel bars as grab bars.  Use non-skid mats or decals in the tub or shower.  If you need to sit down in the shower, use a plastic, non-slip stool.  Keep the floor dry. Clean up any water that spills on the floor as soon as it happens.  Remove soap buildup in the tub or shower regularly.  Attach bath mats securely with double-sided non-slip rug tape.  Do not have throw rugs and other  things on the floor that can make you trip. What can I do in the bedroom?  Use night lights.  Make sure that you have a light by your bed that is easy to reach.  Do not use any sheets or blankets that are too big for your bed. They should not hang down onto the floor.  Have a firm chair that has side arms. You can use this for support while you get dressed.  Do not have throw rugs and other things on the floor that can make you trip. What can I do in the kitchen?  Clean up any spills right away.  Avoid walking on wet floors.  Keep items that you use a lot in easy-to-reach places.  If you need to reach something above you, use a strong step stool that has a grab bar.  Keep electrical cords out of the way.  Do not use floor polish or wax that makes floors slippery. If you must use wax, use non-skid floor wax.  Do not have throw rugs and other things on the floor that can make you trip. What can I do with my  stairs?  Do not leave any items on the stairs.  Make sure that there are handrails on both sides of the stairs and use them. Fix handrails that are broken or loose. Make sure that handrails are as long as the stairways.  Check any carpeting to make sure that it is firmly attached to the stairs. Fix any carpet that is loose or worn.  Avoid having throw rugs at the top or bottom of the stairs. If you do have throw rugs, attach them to the floor with carpet tape.  Make sure that you have a light switch at the top of the stairs and the bottom of the stairs. If you do not have them, ask someone to add them for you. What else can I do to help prevent falls?  Wear shoes that:  Do not have high heels.  Have rubber bottoms.  Are comfortable and fit you well.  Are closed at the toe. Do not wear sandals.  If you use a stepladder:  Make sure that it is fully opened. Do not climb a closed stepladder.  Make sure that both sides of the stepladder are locked into place.  Ask  someone to hold it for you, if possible.  Clearly mark and make sure that you can see:  Any grab bars or handrails.  First and last steps.  Where the edge of each step is.  Use tools that help you move around (mobility aids) if they are needed. These include:  Canes.  Walkers.  Scooters.  Crutches.  Turn on the lights when you go into a dark area. Replace any light bulbs as soon as they burn out.  Set up your furniture so you have a clear path. Avoid moving your furniture around.  If any of your floors are uneven, fix them.  If there are any pets around you, be aware of where they are.  Review your medicines with your doctor. Some medicines can make you feel dizzy. This can increase your chance of falling. Ask your doctor what other things that you can do to help prevent falls. This information is not intended to replace advice given to you by your health care provider. Make sure you discuss any questions you have with your health care provider. Document Released: 09/09/2009 Document Revised: 04/20/2016 Document Reviewed: 12/18/2014 Elsevier Interactive Patient Education  2017 [employer].

## 2020-01-27 NOTE — Progress Notes (Signed)
Provider: Lauree Chandler, NP  Patient Care Team: Lauree Chandler, NP as PCP - General (Geriatric Medicine) Audrie Lia, MD as Referring Physician (Dermatology) Brain Hilts, MD as Referring Physician (Gastroenterology) [place], [facility])  Extended Emergency Contact Information Primary Emergency Contact: Michael [place] Phone: [phone] Relation: Daughter Secondary Emergency Contact: [facility] Phone: [phone] Relation: Friend Allergies  Allergen Reactions  . Atorvastatin     Elevated Liver Enzymes.    Code Status: FULL Goals of Care: Advanced Directive information Advanced Directives 01/27/2020  Does Patient Have a Medical Advance Directive? -  Type of Paramedic of [place];Living will  Does patient want to make changes to medical advance directive? No - Patient declined  Copy of [place] in Chart? Yes - validated most recent copy scanned in chart (See row information)     Chief Complaint  Patient presents with  . Annual Exam    Physical Exam    PLACE OF SERVICE: [place]   HPI: Patient is a 74 y.o. female seen in today for an annual exam.   Completed AWV today She had her blood work drawn after visit in January but never heard anything. Had them done a week later.    Diet?has had to change diet due to colitis,  Nasty flare in November and went on steroids to bring it down. Got better but then flared up again. Now she has modified diet again and stopped a lot of fruits and vegetables due to this.  No raw fruits except for bananas  Used to eat salad every day for lunch More carbohydrates More cereal  Eating canned and cook fruits which has helped the gut Bowel movements have gone in frequency but not always formed.  Using food as a comfort  Stopped drinking all alcohol.  Exercise? Daily through PT and walking    Dentition: routinely every 6  months   Ophthalmology appt: eye care- goes yearly   Routine specialist: GI, podiatrist, dermatologist. Ortho for shoulder and hip - [facility].   Depression screen PHQ 2/9 01/27/2020  Decreased Interest 0  Down, Depressed, Hopeless 0  PHQ - 2 Score 0    Fall Risk  01/27/2020 01/27/2020 12/16/2019  Falls in the past year? 0 0 0  Number falls in past yr: 0 0 0  Injury with Fall? 0 0 0   MMSE - Mini Mental State Exam 01/27/2020  Orientation to time 5  Orientation to Place 5  Registration 3  Attention/ Calculation 5  Recall 3  Language- name 2 objects 2  Language- repeat 1  Language- follow 3 step command 3  Language- read & follow direction 1  Write a sentence 1  Copy design 1  Total score 30     Health Maintenance  Topic Date Due  . DEXA SCAN  03/02/2011  . COLONOSCOPY  05/27/2020 (Originally 06/26/2019)  . TETANUS/TDAP  03/01/2021  . MAMMOGRAM  06/04/2021  . INFLUENZA VACCINE  Completed  . Hepatitis C Screening  Completed  . PNA vac Low Risk Adult  Completed    Past Medical History:  Diagnosis Date  . Arthritis    knee and hip,  per [facility] Patient [facility].   . Bilateral bunions   . 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   . Colitis   . H/O CT scan of chest 11/27/2018   By Berle Mull,  per [facility] Patient Packet.   . H/O mammogram 11/27/2018   By Arvilla Market at [facility], per [facility] New Patient Packet.   Marland Kitchen History of CT scan of abdomen 11/27/2018   By Berle Mull, per [facility] Patient Packet.   Marland Kitchen History of melanoma     per [facility] Patient Packet.   Marland Kitchen History of upper extremity x-ray 11/27/2018   By Bridgette Habermann, of Shoulder, per [facility] New Patient Packet.   Marland Kitchen History of x-ray of hip 11/27/2018   By Bridgette Habermann, of Hip, per [facility] New Patient Packet.   . Hyperlipemia     per [facility] Patient Packet.   . Hypertension     per [facility] Patient Packet.   . Impingement syndrome of shoulder     per [facility] Patient Packet.   .  Incontinence    Mild Nighttime per [facility] New Patient Packet.  . Insomnia   . Melanoma ([place])   . Osteoarthritis of multiple joints   . Rosacea     per [facility] New Patient Packet.   . Sleep disorder     Past Surgical History:  Procedure Laterality Date  . ABDOMINAL HYSTERECTOMY  11/28/1987   Partial per [facility] New Patient Packet  . ARTHROSCOPIC REPAIR ACL Left 11/28/2003   Left Knee/ Torn Medial Meniscus per [facility] New Patient Packet  . BREAST BIOPSY    . CATARACT EXTRACTION W/ INTRAOCULAR LENS IMPLANT Bilateral   . COLONOSCOPY  11/27/2016   By Elspeth Cho,  per [facility] New Patient Packet.   . COLONOSCOPY  11/27/2017   By Elspeth Cho, per [facility] New Patient Packet.   Marland Kitchen HYSTEROTOMY  11/28/1987   per [facility] New Patient Packet  . KNEE SURGERY    . MELANOMA EXCISION  11/27/1998   Right Shin per [facility] New Patient Packet  . OOPHORECTOMY  11/27/2010   along with melanoma mass per [facility] New Patient Packet  . TONSILLECTOMY  11/27/1950   per [facility] New Patient Packet    Social History   Socioeconomic History  . Marital status: Widowed    Spouse name: Not on file  . Number of children: 2  . Years of education: Not on file  . Highest education level: Not on file  Occupational History  . Occupation: Scientist, physiological    Comment: Retired  Tobacco Use  . Smoking status: Former Smoker    Packs/day: 1.00    Types: Cigarettes    Quit date: 11/27/1989    Years since quitting: 30.1  . Smokeless tobacco: Never Used  Substance and Sexual Activity  . Alcohol use: Yes    Comment: 1 Drink a month  . Drug use: Never  . Sexual activity: Not on file  Other Topics Concern  . Not on file  Social History Narrative   Husband was Sherryll Burger minister---they spent time overseas teaching   Son in [place]   Daughter in [place]         Has living will   Son is health care POA----daughter is alternate   Would accept resuscitation attempts   Doesn't want tube feeds if cognitively unaware      Per [facility] New Patient Packet,  abstracted 12/09/19      Diet: Patient states that she tries to have a diet rich in fruit, vegetables, and whole grains. But needs to modify diet because of colitis.       Caffeine: Yes      Married, if yes what year: Widowed/ Married in 1968  Do you live in a house, apartment, assisted living, condo, trailer, ect: [facility], 1 person      Pets: 1 Dog   Highest Level of Education completed? B.A.      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       Exercise: Yes, daily walking          Living Will: Yes   DNR: Yes   POA/HPOA: Yes       Functional Status:   Do you have difficulty bathing or dressing yourself? No    Do you have difficulty preparing food or eating? No   Do you have difficulty managing your medications? No    Do you have difficulty managing your finances? No    Do you have difficulty affording your medications? No   Social Determinants of Health   Financial Resource Strain:   . Difficulty of Paying Living Expenses: Not on file  Food Insecurity:   . Worried About Charity fundraiser in the Last Year: Not on file  . Ran Out of Food in the Last Year: Not on file  Transportation Needs:   . Lack of Transportation (Medical): Not on file  . Lack of Transportation (Non-Medical): Not on file  Physical Activity:   . Days of Exercise per Week: Not on file  . Minutes of Exercise per Session: Not on file  Stress:   . Feeling of Stress : Not on file  Social Connections:   . Frequency of Communication with Friends and Family: Not on file  . Frequency of Social Gatherings with Friends and Family: Not on file  . Attends Religious Services: Not on file  . Active Member of Clubs or Organizations: Not on file  . Attends Archivist Meetings: Not on file  . Marital Status: Not on file    Family History  Problem Relation Age of Onset  . Alzheimer's disease  Mother   . Lung cancer Maternal Grandfather   . Heart attack Paternal Grandfather   . Diabetes Neg Hx     Review of Systems:  Review of Systems  Constitutional: Negative for chills, fever and weight loss.  HENT: Negative for tinnitus.   Respiratory: Negative for cough, sputum production and shortness of breath.   Cardiovascular: Negative for chest pain, palpitations and leg swelling.  Gastrointestinal: Positive for diarrhea. Negative for abdominal pain, constipation and heartburn.  Genitourinary: Negative for dysuria, frequency and urgency.  Musculoskeletal: Positive for joint pain. Negative for back pain, falls and myalgias.  Skin: Negative.   Neurological: Negative for dizziness and headaches.  Psychiatric/Behavioral: Negative for depression and memory loss. The patient does not have insomnia.      Allergies as of 01/27/2020      Reactions   Atorvastatin    Elevated Liver Enzymes.       Medication List       Accurate as of January 27, 2020  1:40 PM. If you have any questions, ask your nurse or doctor.        STOP taking these medications   Benadryl Allergy 25 mg capsule Generic drug: diphenhydrAMINE Stopped by: Lauree Chandler, NP   COVID-19 mRNA vaccine (Moderna) 100 MCG/0.5ML Susp Stopped by: Lauree Chandler, NP     TAKE these medications   amLODipine 5 MG tablet Commonly known as: NORVASC Take 1 tablet (5 mg total) by mouth daily.   b complex vitamins tablet Take  1 tablet by mouth daily.   diclofenac sodium 1 % Gel Commonly known as: VOLTAREN Apply topically as needed.   econazole nitrate 1 % cream 1 application.   MESALAMINE PO Take 4.8 g by mouth daily.   metroNIDAZOLE 0.75 % cream Commonly known as: METROCREAM Apply 1 application topically daily.   pravastatin 40 MG tablet Commonly known as: PRAVACHOL Take 1 tablet (40 mg total) by mouth daily.   Systane Complete 0.6 % Soln Generic drug: Propylene Glycol   traZODone 100 MG  tablet Commonly known as: DESYREL Take 1 tablet (100 mg total) by mouth at bedtime.   Turmeric 500 MG Caps Take 1 capsule by mouth 2 (two) times daily.         Physical Exam: Vitals:   01/27/20 1318  BP: 118/76  Pulse: 72  Resp: 16  Temp: 97.8 F (36.6 C)  SpO2: 96%  Weight: 160 lb (72.6 kg)  Height: 5' 4"  (1.626 m)   Body mass index is 27.46 kg/m. Wt Readings from Last 3 Encounters:  01/27/20 160 lb (72.6 kg)  01/27/20 160 lb (72.6 kg)  12/16/19 158 lb (71.7 kg)    Physical Exam Constitutional:      General: She is not in acute distress.    Appearance: She is well-developed. She is not diaphoretic.  HENT:     Head: Normocephalic and atraumatic.     Mouth/Throat:     Pharynx: No oropharyngeal exudate.  Eyes:     Conjunctiva/sclera: Conjunctivae normal.     Pupils: Pupils are equal, round, and reactive to light.  Cardiovascular:     Rate and Rhythm: Normal rate and regular rhythm.     Heart sounds: Normal heart sounds.  Pulmonary:     Effort: Pulmonary effort is normal.     Breath sounds: Normal breath sounds.  Chest:     Breasts:        Right: Normal.        Left: Normal.  Abdominal:     General: Bowel sounds are normal.     Palpations: Abdomen is soft.  Musculoskeletal:        General: No tenderness.     Cervical back: Normal range of motion and neck supple.  Skin:    General: Skin is warm and dry.  Neurological:     Mental Status: She is alert and oriented to person, place, and time.     Labs reviewed: Basic Metabolic Panel: No results for input(s): NA, K, CL, CO2, GLUCOSE, BUN, CREATININE, CALCIUM, MG, PHOS, TSH in the last 8760 hours. Liver Function Tests: No results for input(s): AST, ALT, ALKPHOS, BILITOT, PROT, ALBUMIN in the last 8760 hours. No results for input(s): LIPASE, AMYLASE in the last 8760 hours. No results for input(s): AMMONIA in the last 8760 hours. CBC: No results for input(s): WBC, NEUTROABS, HGB, HCT, MCV, PLT in the  last 8760 hours. Lipid Panel: No results for input(s): CHOL, HDL, LDLCALC, TRIG, CHOLHDL, LDLDIRECT in the last 8760 hours. No results found for: HGBA1C  Procedures: No results found.  Assessment/Plan 1. Primary insomnia Doing well on trazodone. She has stopped benadryl  - traZODone (DESYREL) 100 MG tablet; Take 1 tablet (100 mg total) by mouth at bedtime.  Dispense: 90 tablet; Refill: 3  2. Essential hypertension -controlled on norvasc 5 mg daily with dietary modifications.  - amLODipine (NORVASC) 5 MG tablet; Take 1 tablet (5 mg total) by mouth daily.  Dispense: 90 tablet; Refill: 3  3. Hyperlipidemia, unspecified hyperlipidemia  type -continues on Pravachol with dietary modifications.  - pravastatin (PRAVACHOL) 40 MG tablet; Take 1 tablet (40 mg total) by mouth daily.  Dispense: 90 tablet; Refill: 3  4. Rosacea Stable, following with dermatology routinely for this.   5. Primary osteoarthritis involving multiple joints Continues with PT which has been beneficial. Stopped using NSAIDs routinely at this time.    6. Preventative health care -AWV completed today.  Information provided on regular self-examination of the breasts on a monthly basis, prevention of dental and periodontal disease, diet, regular sustained exercise for at least 30 minutes 5 times per week, routine screening interval for mammogram as recommended by the [place] and ACOG, importance of regular PAP smears, and recommended schedule for GI hemoccult testing, colonoscopy, cholesterol, thyroid and diabetes screening. -she is due for bone density and order has been placed, looking to schedule at this time. -pt reports lab work was obtained but [facility] has not received yet, lab to fax them to office  -pt to follow up in 2 weeks for MOST form completion.     Next appt: 6 months for routine follow up.  Carlos American. [place], [facility] [phone]

## 2020-01-27 NOTE — Progress Notes (Addendum)
Location of services - [facility] Subjective:   Barbara Burgess is a 74 y.o. female who presents for Medicare Annual (Subsequent) preventive examination.  Review of Systems:         Objective:     Vitals: BP 118/76   Pulse (!) 103   Temp 97.8 F (36.6 C)   Resp 16   Ht 5' 4"  (1.626 m)   Wt 160 lb (72.6 kg)   SpO2 96%   BMI 27.46 kg/m   Body mass index is 27.46 kg/m.  Advanced Directives 01/27/2020 01/27/2020 12/16/2019  Does Patient Have a Medical Advance Directive? - Yes Yes  Type of Paramedic of [place];Living will [place];Living will [place];Living will  Does patient want to make changes to medical advance directive? No - Patient declined No - Patient declined No - Patient declined  Copy of [place] in Chart? Yes - validated most recent copy scanned in chart (See row information) Yes - validated most recent copy scanned in chart (See row information) Yes - validated most recent copy scanned in chart (See row information)    Tobacco Social History   Tobacco Use  Smoking Status Former Smoker  . Packs/day: 1.00  . Types: Cigarettes  . Quit date: 11/27/1989  . Years since quitting: 30.1  Smokeless Tobacco Never Used     Counseling given: Not Answered   Clinical Intake:  Pre-visit preparation completed: Yes  Pain : 0-10 Pain Score: 2  Pain Type: Chronic pain Pain Location: Hip Pain Orientation: Left Pain Radiating Towards: groin and side area Pain Descriptors / Indicators: Sharp, Other (Comment)(deep) Pain Onset: More than a month ago Pain Frequency: Intermittent Pain Relieving Factors: walking and PT Effect of Pain on Daily Activities: sitting and walking  Pain Relieving Factors: walking and PT  BMI - recorded: 27.46 Nutritional Status: BMI 25 -29 Overweight Nutritional Risks: Unintentional weight gain, Nausea/ vomitting/ diarrhea Diabetes: No  How often do you need to  have someone help you when you read instructions, pamphlets, or other written materials from your doctor or pharmacy?: 1 - Never What is the last grade level you completed in school?: college degree  Interpreter Needed?: No     Past Medical History:  Diagnosis Date  . Arthritis    knee and hip,  per [facility] New Patient Packet.   . Bilateral bunions   . 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   . Colitis   . H/O CT scan of chest 11/27/2018   By Berle Mull, per [facility] New Patient Packet.   . H/O mammogram 11/27/2018   By Arvilla Market at [facility], per [facility] New Patient Packet.   Marland Kitchen History of CT scan of abdomen 11/27/2018   By Berle Mull, per [facility] New Patient Packet.   Marland Kitchen History of melanoma     per [facility] New Patient Packet.   Marland Kitchen History of upper extremity x-ray 11/27/2018   By Bridgette Habermann, of Shoulder, per [facility] New Patient Packet.   Marland Kitchen History of x-ray of hip 11/27/2018   By Bridgette Habermann, of Hip, per [facility] New Patient Packet.   . Hyperlipemia     per [facility] New Patient Packet.   . Hypertension     per [facility] New Patient Packet.   . Impingement syndrome of shoulder     per [facility] New Patient Packet.   . Incontinence    Mild Nighttime per [facility]cs New  Patient Packet.  . Insomnia   . Melanoma ([place])   . Osteoarthritis of multiple joints   . Rosacea     per [facility] New Patient Packet.   . Sleep disorder    Past Surgical History:  Procedure Laterality Date  . ABDOMINAL HYSTERECTOMY  11/28/1987   Partial per pcs New Patient Packet  . ARTHROSCOPIC REPAIR ACL Left 11/28/2003   Left Knee/ Torn Medial Meniscus per [facility] New Patient Packet  . BREAST BIOPSY    . CATARACT EXTRACTION W/ INTRAOCULAR LENS IMPLANT Bilateral   . COLONOSCOPY  11/27/2016   By Elspeth Cho,  per [facility] New Patient Packet.   . COLONOSCOPY  11/27/2017   By Elspeth Cho, per psc New Patient Packet.   Marland Kitchen HYSTEROTOMY  11/28/1987   per [facility] New Patient Packet  . KNEE SURGERY    . MELANOMA EXCISION   11/27/1998   Right Shin per [facility] New Patient Packet  . OOPHORECTOMY  11/27/2010   along with melanoma mass per p[facility] New Patient Packet  . TONSILLECTOMY  11/27/1950   per [facility] New Patient Packet   Family History  Problem Relation Age of Onset  . Alzheimer's disease Mother   . Lung cancer Maternal Grandfather   . Heart attack Paternal Grandfather   . Diabetes Neg Hx    Social History   Socioeconomic History  . Marital status: Widowed    Spouse name: Not on file  . Number of children: 2  . Years of education: Not on file  . Highest education level: Not on file  Occupational History  . Occupation: Scientist, physiological    Comment: Retired  Tobacco Use  . Smoking status: Former Smoker    Packs/day: 1.00    Types: Cigarettes    Quit date: 11/27/1989    Years since quitting: 30.1  . Smokeless tobacco: Never Used  Substance and Sexual Activity  . Alcohol use: Yes    Comment: 1 Drink a month  . Drug use: Never  . Sexual activity: Not on file  Other Topics Concern  . Not on file  Social History Narrative   Husband was Sherryll Burger minister---they spent time overseas teaching   Son in [place]   Daughter in [place]         Has living will   Son is health care POA----daughter is alternate   Would accept resuscitation attempts   Doesn't want tube feeds if cognitively unaware      Per [facility] New Patient Packet, abstracted 12/09/19      Diet: Patient states that she tries to have a diet rich in fruit, vegetables, and whole grains. But needs to modify diet because of colitis.       Caffeine: Yes      Married, if yes what year: Widowed/ Married in [place] you live in a house, apartment, assisted living, [place], trailer, ect: [facility] at [place], 1 person      Pets: 1 Dog   Highest Level of Education completed? B.A.      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       Exercise: Yes, daily  walking          Living Will: Yes   DNR: Yes   POA/HPOA: Yes       Functional Status:   Do you have difficulty bathing or dressing yourself? No    Do you have difficulty preparing food or  eating? No   Do you have difficulty managing your medications? No    Do you have difficulty managing your finances? No    Do you have difficulty affording your medications? No   Social Determinants of Health   Financial Resource Strain:   . Difficulty of Paying Living Expenses: Not on file  Food Insecurity:   . Worried About Charity fundraiser in the Last Year: Not on file  . Ran Out of Food in the Last Year: Not on file  Transportation Needs:   . Lack of Transportation (Medical): Not on file  . Lack of Transportation (Non-Medical): Not on file  Physical Activity:   . Days of Exercise per Week: Not on file  . Minutes of Exercise per Session: Not on file  Stress:   . Feeling of Stress : Not on file  Social Connections:   . Frequency of Communication with Friends and Family: Not on file  . Frequency of Social Gatherings with Friends and Family: Not on file  . Attends Religious Services: Not on file  . Active Member of Clubs or Organizations: Not on file  . Attends Archivist Meetings: Not on file  . Marital Status: Not on file    Outpatient Encounter Medications as of 01/27/2020  Medication Sig  . amLODipine (NORVASC) 5 MG tablet Take 1 tablet (5 mg total) by mouth daily.  Marland Kitchen b complex vitamins tablet Take 1 tablet by mouth daily.  . diclofenac sodium (VOLTAREN) 1 % GEL Apply topically as needed.   Marland Kitchen econazole nitrate 1 % cream 1 application.   Marland Kitchen MESALAMINE PO Take 4.8 g by mouth daily.  . metroNIDAZOLE (METROCREAM) 0.75 % cream Apply 1 application topically daily.   . pravastatin (PRAVACHOL) 40 MG tablet Take 1 tablet (40 mg total) by mouth daily.  Carren Rang COMPLETE 0.6 % SOLN   . traZODone (DESYREL) 100 MG tablet Take 1 tablet (100 mg total) by mouth at bedtime.  . Turmeric  500 MG CAPS Take 1 capsule by mouth 2 (two) times daily.  . [DISCONTINUED] COVID-19 mRNA vaccine, Moderna, 100 MCG/0.5ML SUSP Moderna COVID-19 Vaccine (PF) 100 mcg/0.5 mL intramuscular susp. (EUA)  PHARMACY ADMINISTERED  . [DISCONTINUED] diphenhydrAMINE (BENADRYL ALLERGY) 25 mg capsule Take 25 mg by mouth daily.    No facility-administered encounter medications on file as of 01/27/2020.    Activities of Daily Living No flowsheet data found.  Patient Care Team: Lauree Chandler, NP as PCP - General (Geriatric Medicine) Audrie Lia, MD as Referring Physician (Dermatology) Brain Hilts, MD as Referring Physician (Gastroenterology) Pa, [facility])    Assessment:   This is a routine wellness examination for [facility].  Exercise Activities and Dietary recommendations    Goals    . Weight (lb) < 150 lb (68 kg)     Through diet and exercise.        Fall Risk Fall Risk  01/27/2020 01/27/2020 12/16/2019  Falls in the past year? 0 0 0  Number falls in past yr: 0 0 0  Injury with Fall? 0 0 0   Is the patient's home free of loose throw rugs in walkways, pet beds, electrical cords, etc?   yes      Grab bars in the bathroom? yes      Handrails on the stairs?   yes      Adequate lighting?   yes  Timed Get Up and Go performed: na  Depression Screen PHQ 2/9  Scores 01/27/2020  PHQ - 2 Score 0     Cognitive Function MMSE - Mini Mental State Exam 01/27/2020  Orientation to time 5  Orientation to Place 5  Registration 3  Attention/ Calculation 5  Recall 3  Language- name 2 objects 2  Language- repeat 1  Language- follow 3 step command 3  Language- read & follow direction 1  Write a sentence 1  Copy design 1  Total score 30        Immunization History  Administered Date(s) Administered  . Influenza, High Dose Seasonal PF 09/15/2013, 09/01/2014, 08/20/2019  . Influenza, Seasonal, Injecte, Preservative Fre 09/24/2006, 11/10/2010, 09/28/2011, 09/04/2012  .  Influenza,inj,Quad PF,6+ Mos 08/14/2016  . Influenza-Unspecified 09/07/2015, 09/12/2016, 09/12/2017, 08/29/2018  . Pneumococcal Conjugate-13 03/04/2015  . Pneumococcal Polysaccharide-23 03/02/2011  . Td 03/02/2011  . Zoster 04/02/2007  . Zoster Recombinat (Shingrix) 08/21/2016, 05/21/2017    Qualifies for Shingles Vaccine?yes, up to date  Screening Tests Health Maintenance  Topic Date Due  . DEXA SCAN  03/02/2011  . COLONOSCOPY  05/27/2020 (Originally 06/26/2019)  . TETANUS/TDAP  03/01/2021  . MAMMOGRAM  06/04/2021  . INFLUENZA VACCINE  Completed  . Hepatitis C Screening  Completed  . PNA vac Low Risk Adult  Completed    Cancer Screenings: Lung: Low Dose CT Chest recommended if Age 74-80 years, 30 pack-year currently smoking OR have quit w/in 15years. Patient does not qualify. Breast:  Up to date on Mammogram? Yes   Up to date of Bone Density/Dexa? No has been ordered Colorectal: through GI, due in July   Additional Screenings:  Hepatitis C Screening: done, negative     Plan:      I have personally reviewed and noted the following in the patient's chart:   . Medical and social history . Use of alcohol, tobacco or illicit drugs  . Current medications and supplements . Functional ability and status . Nutritional status . Physical activity . Advanced directives . List of other physicians . Hospitalizations, surgeries, and ER visits in previous 12 months . Vitals . Screenings to include cognitive, depression, and falls . Referrals and appointments  In addition, I have reviewed and discussed with patient certain preventive protocols, quality metrics, and best practice recommendations. A written personalized care plan for preventive services as well as general preventive health recommendations were provided to patient.     Lauree Chandler, NP  01/27/2020

## 2020-01-28 ENCOUNTER — Telehealth: Payer: Self-pay

## 2020-01-28 DIAGNOSIS — E785 Hyperlipidemia, unspecified: Secondary | ICD-10-CM

## 2020-01-28 MED ORDER — ROSUVASTATIN CALCIUM 10 MG PO TABS: 10.0000 mg | ORAL_TABLET | Freq: Every day | ORAL | 3 refills | Status: AC

## 2020-01-28 NOTE — Telephone Encounter (Signed)
Called patient to discuss lab results. Patient is aware that ALT ( Liver Enzyme ) are slightly elevated. Kidney, Blood Count, and Electrolytes are stable. Total cholesterol & LDL ( bad cholesterol ) are elevated. Goal is to be less than 100 and patient is at 133. Diet is limited due to Colitis. Patient understands recommended cholesterol medication ( Crestor 74m ) daily. Patient agrees to rechecking Liver Enzymes and Lipids in 6 weeks. Patient states that it's not abnormal for her Liver Enzymes to be elevated. So it wasn't very alarming to her.

## 2020-01-30 ENCOUNTER — Encounter: Payer: Self-pay | Admitting: Nurse Practitioner

## 2020-02-02 NOTE — Telephone Encounter (Signed)
Message routed to Lauree Chandler, NP

## 2020-02-02 NOTE — Telephone Encounter (Signed)
Rodena Piety have you received any correspondence from patients pharmacy to discuss Crestor?

## 2020-02-04 ENCOUNTER — Encounter: Payer: Self-pay | Admitting: Podiatry

## 2020-02-10 ENCOUNTER — Encounter: Payer: Self-pay | Admitting: Nurse Practitioner

## 2020-02-10 ENCOUNTER — Other Ambulatory Visit: Payer: Self-pay

## 2020-02-10 ENCOUNTER — Ambulatory Visit: Payer: Medicare PPO | Admitting: Nurse Practitioner

## 2020-02-10 VITALS — BP 130/80 | HR 80 | Temp 97.8°F | Resp 16 | Ht 64.0 in | Wt 158.0 lb

## 2020-02-10 DIAGNOSIS — Z7189 Other specified counseling: Secondary | ICD-10-CM

## 2020-02-10 DIAGNOSIS — E785 Hyperlipidemia, unspecified: Secondary | ICD-10-CM | POA: Diagnosis not present

## 2020-02-10 NOTE — Progress Notes (Signed)
Careteam: Patient Care Team: Lauree Chandler, NP as PCP - General (Geriatric Medicine) Audrie Lia, MD as Referring Physician (Dermatology) Brain Hilts, MD as Referring Physician (Gastroenterology) Pa, [facility]) [place], MD as Referring Physician (Orthopedic Surgery)  Advanced Directive information Does Patient Have a Medical Advance Directive?: Yes, Type of Advance Directive: [place];Living will;Out of facility DNR (pink MOST or yellow form), Pre-existing out of facility DNR order (yellow form or pink MOST form): Pink MOST/Yellow Form most recent copy in chart - Physician notified to receive inpatient order, Does patient want to make changes to medical advance directive?: No - Patient declined  Allergies  Allergen Reactions  . Atorvastatin     Elevated Liver Enzymes.     Chief Complaint  Patient presents with  . Acute Visit    Most Form Completion      HPI: Patient is a 74 y.o. female seen in today at the [facility] for MOST form complete Would like 4 copies of document.   Hyperlipidemia- started crestor last week, no side effects noted at this time. Does not feel any different. Stopped pravastatin. Has follow up labs scheduled.    Review of Systems:  Review of Systems  Constitutional: Negative for chills, fever and malaise/fatigue.  Musculoskeletal: Negative for joint pain and myalgias.    Past Medical History:  Diagnosis Date  . Arthritis    knee and hip,  per [facility]sc New Patient Packet.   . Bilateral bunions   . 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   . Colitis   . H/O CT scan of chest 11/27/2018   By Berle Mull, per psc New Patient Packet.   . H/O mammogram 11/27/2018   By Arvilla Market at [facility], per psc New Patient Packet.   Marland Kitchen History of CT scan of abdomen 11/27/2018   By Berle Mull, per psc New Patient Packet.   Marland Kitchen History of melanoma     per psc New  Patient Packet.   Marland Kitchen History of upper extremity x-ray 11/27/2018   By Bridgette Habermann, of Shoulder, per psc New Patient Packet.   Marland Kitchen History of x-ray of hip 11/27/2018   By Bridgette Habermann, of Hip, per psc New Patient Packet.   . Hyperlipemia     per psc New Patient Packet.   . Hypertension     per [facility]sc New Patient Packet.   . Impingement syndrome of shoulder     per [facility]sc New Patient Packet.   . Incontinence    Mild Nighttime per pcs New Patient Packet.  . Insomnia   . Melanoma ([place])   . Osteoarthritis of multiple joints   . Rosacea     per [facility] New Patient Packet.   . Sleep disorder    Past Surgical History:  Procedure Laterality Date  . ABDOMINAL HYSTERECTOMY  11/28/1987   Partial per pcs New Patient Packet  . ARTHROSCOPIC REPAIR ACL Left 11/28/2003   Left Knee/ Torn Medial Meniscus per pcs New Patient Packet  . BREAST BIOPSY    . CATARACT EXTRACTION W/ INTRAOCULAR LENS IMPLANT Bilateral   . COLONOSCOPY  11/27/2016   By Elspeth Cho,  per psc New Patient Packet.   . COLONOSCOPY  11/27/2017   By Elspeth Cho, per psc New Patient Packet.   Marland Kitchen HYSTEROTOMY  11/28/1987   per [facility]cs New Patient Packet  . KNEE SURGERY    . MELANOMA EXCISION  11/27/1998  Right Shin per pcs New Patient Packet  . OOPHORECTOMY  11/27/2010   along with melanoma mass per pcs New Patient Packet  . TONSILLECTOMY  11/27/1950   per pcs New Patient Packet   Social History:   reports that she quit smoking about 30 years ago. Her smoking use included cigarettes. She smoked 1.00 pack per day. She has never used smokeless tobacco. She reports current alcohol use. She reports that she does not use drugs.  Family History  Problem Relation Age of Onset  . Alzheimer's disease Mother   . Lung cancer Maternal Grandfather   . Heart attack Paternal Grandfather   . Diabetes Neg Hx     Medications: Patient's Medications  New Prescriptions   No medications on file  Previous Medications   AMLODIPINE  (NORVASC) 5 MG TABLET    Take 1 tablet (5 mg total) by mouth daily.   B COMPLEX VITAMINS TABLET    Take 1 tablet by mouth daily.   DICLOFENAC SODIUM (VOLTAREN) 1 % GEL    Apply topically as needed.    ECONAZOLE NITRATE 1 % CREAM    1 application.    MESALAMINE (LIALDA) 1.2 G EC TABLET    Take 4.8 g by mouth daily.   METRONIDAZOLE (METROCREAM) 0.75 % CREAM    Apply 1 application topically daily.    ROSUVASTATIN (CRESTOR) 10 MG TABLET    Take 1 tablet (10 mg total) by mouth daily.   SYSTANE COMPLETE 0.6 % SOLN       TRAZODONE (DESYREL) 100 MG TABLET    Take 1 tablet (100 mg total) by mouth at bedtime.   TURMERIC 500 MG CAPS    Take 1 capsule by mouth 2 (two) times daily.  Modified Medications   No medications on file  Discontinued Medications   MESALAMINE PO    Take 4.8 g by mouth daily.    Physical Exam:  Vitals:   02/10/20 1318  BP: 130/80  Pulse: 80  Resp: 16  Temp: 97.8 F (36.6 C)  SpO2: 99%  Weight: 158 lb (71.7 kg)  Height: 5' 4"  (1.626 m)   Body mass index is 27.12 kg/m. Wt Readings from Last 3 Encounters:  02/10/20 158 lb (71.7 kg)  01/27/20 160 lb (72.6 kg)  01/27/20 160 lb (72.6 kg)    Physical Exam Constitutional:      Appearance: Normal appearance.  HENT:     Head: Normocephalic and atraumatic.  Skin:    General: Skin is warm and dry.  Neurological:     General: No focal deficit present.     Mental Status: She is alert and oriented to person, place, and time. Mental status is at baseline.  Psychiatric:        Mood and Affect: Mood normal.        Behavior: Behavior normal.     Labs reviewed: Basic Metabolic Panel: Recent Labs    12/18/19 0000  NA 143  K 5.1  CL 107  CO2 24*  BUN 15  CREATININE 0.7   Liver Function Tests: Recent Labs    12/18/19 0000  AST 30  ALT 45*  ALKPHOS 97  ALBUMIN 4.3   No results for input(s): LIPASE, AMYLASE in the last 8760 hours. No results for input(s): AMMONIA in the last 8760 hours. CBC: Recent Labs     12/18/19 0000  WBC 5.1  NEUTROABS 2,769  HGB 14.7  HCT 45  PLT 23*   Lipid Panel: Recent Labs  12/18/19 0000  CHOL 229*  LDLCALC 133  TRIG 121   TSH: No results for input(s): TSH in the last 8760 hours. A1C: No results found for: HGBA1C   Assessment/Plan 1. Hyperlipidemia, unspecified hyperlipidemia type -LDL and total cholesterol elevated with borderline elevated liver enzymes therefore started on Crestor with follow up LFT and lipid scheduled   2. Advanced care planning/counseling discussion -Most from completed and copies provided, [facility] nurse also provided copy of documents.  - DNR (Do Not Resuscitate)  Next appt: 07/29/2020 as scheduled.  Carlos American. [place], [facility] [phone]

## 2020-03-18 LAB — COMPREHENSIVE METABOLIC PANEL
Albumin: 4.3 (ref 3.5–5.0)
Globulin: 2.5

## 2020-03-18 LAB — LIPID PANEL
Cholesterol: 201 — AB (ref 0–200)
HDL: 66 (ref 35–70)
LDL Cholesterol: 108
Triglycerides: 158 (ref 40–160)

## 2020-03-18 LAB — HEPATIC FUNCTION PANEL
ALT: 50 — AB (ref 7–35)
AST: 29 (ref 13–35)
Alkaline Phosphatase: 88 (ref 25–125)

## 2020-03-19 ENCOUNTER — Telehealth: Payer: Self-pay

## 2020-03-19 NOTE — Telephone Encounter (Signed)
Called patient and discussed labs. Patient made aware that she has Improvement in her LDL Cholesterol on crestor, Liver enzymes stable, and to continue current regimen per Sherrie Mustache. NP. Patient also made are that lab results will be posted through [place].

## 2020-04-27 ENCOUNTER — Ambulatory Visit (INDEPENDENT_AMBULATORY_CARE_PROVIDER_SITE_OTHER): Payer: Medicare PPO | Admitting: Gastroenterology

## 2020-04-27 ENCOUNTER — Other Ambulatory Visit: Payer: Self-pay

## 2020-04-27 ENCOUNTER — Encounter: Payer: Self-pay | Admitting: Gastroenterology

## 2020-04-27 VITALS — BP 128/75 | HR 81 | Temp 97.2°F | Ht 64.0 in | Wt 160.2 lb

## 2020-04-27 DIAGNOSIS — K529 Noninfective gastroenteritis and colitis, unspecified: Secondary | ICD-10-CM

## 2020-04-27 DIAGNOSIS — K5130 Ulcerative (chronic) rectosigmoiditis without complications: Secondary | ICD-10-CM | POA: Diagnosis not present

## 2020-04-27 NOTE — Progress Notes (Signed)
Cephas Darby, MD [address]  [place]  [place], [place] [address]  Main: [phone]  Fax: [phone]    Gastroenterology Consultation  Referring Provider:     Brain Hilts, MD Primary Care Physician:  Lauree Chandler, NP Primary Gastroenterologist:  Dr. Geoffery Lyons Reason for Consultation:     Left-sided UC        HPI:   Barbara Burgess is a 74 y.o. female referred by Dr. Dewaine Oats, Carlos American, NP  for consultation & management of left-sided UC.  Patient has history of ulcerative proctosigmoiditis since 2006 who was maintained in remission on Canasa, she was in histologic remission on 09/15/2018, followed by flareup, subsequently underwent colonoscopy in 02/2018 which revealed chronic mildly active proctosigmoiditis, since then she has been on Lialda 4.8 g daily.  She recently underwent repeat colonoscopy in 02/2020 due to flareup of diarrhea and abdominal cramps which revealed unremarkable right and left colon biopsies with no evidence of dysplasia.  Her symptoms resolved after colonoscopy.  However, for last 3 to 4 weeks patient has been experiencing several episodes of nonbloody diarrhea 5-6 times daily associated with abdominal cramps and bloating.  She denies any abdominal pain.  She denies weight loss.  Patient moved from [place] to [facility]  Therefore, she was referred by Dr. Flora Lipps to me to establish care for her IBD Patient is active, exercises daily.  Her GI symptoms are interfering with her routine exercise  No evidence of anemia  NSAIDs: None  Antiplts/Anticoagulants/Anti thrombotics: None  GI Procedures: Colonoscopy 4-21 - The examined portion of the ileum was normal.             - Normal mucosa in the entire examined colon. Biopsied.             - Diverticulosis in the sigmoid colon.             No signs of active colitis.  A: Colon, ascending, biopsy Histologically-unremarkable colonic mucosa No  granulomas, viral cytopathic effect, or dysplasia identified   B: Colon, descending, biopsy Histologically-unremarkable colonic mucosa No granulomas, viral cytopathic effect, or dysplasia identified   Past Medical History:  Diagnosis Date  . Arthritis    knee and hip,  per [facility].   . Bilateral bunions   . 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[place])     per [facility] New Patient Packet.   . Colitis   . H/O CT scan of chest 11/27/2018   By Berle Mull, per [facility] Patient Packet.   . H/O mammogram 11/27/2018   By Arvilla Market at [facility], per [facility] New Patient Packet.   Marland Kitchen History of CT scan of abdomen 11/27/2018   By Berle Mull, per [facility] Patient Packet.   Marland Kitchen History of melanoma     per [facility] Patient Packet.   Marland Kitchen History of upper extremity x-ray 11/27/2018   By Bridgette Habermann, of Shoulder, per [facility] Patient Packet.   Marland Kitchen History of x-ray of hip 11/27/2018   By Bridgette Habermann, of Hip, per [facility] Patient Packet.   . Hyperlipemia     per [facility] Patient [facility].   . Hypertension     per [facility].   . Impingement syndrome of shoulder     per [facility].   . Incontinence    Mild Nighttime per [facility].  . Insomnia   . Melanoma ([place])   . Osteoarthritis of multiple  joints   . Rosacea     per [facility].   . Sleep disorder     Past Surgical History:  Procedure Laterality Date  . ABDOMINAL HYSTERECTOMY  11/28/1987   Partial per [facility] Patient [facility]  . ARTHROSCOPIC REPAIR ACL Left 11/28/2003   Left Knee/ Torn Medial Meniscus per [facility] Patient Packet  . BREAST BIOPSY    . CATARACT EXTRACTION W/ INTRAOCULAR LENS IMPLANT Bilateral   . COLONOSCOPY  11/27/2016   By Elspeth Cho,  per [facility] Patient [facility].   . COLONOSCOPY  11/27/2017   By Elspeth Cho, per [facility] Patient Packet.   Marland Kitchen HYSTEROTOMY  11/28/1987   per [facility] Patient Packet  . KNEE SURGERY    . MELANOMA EXCISION  11/27/1998   Right Shin  per [facility] New Patient Packet  . OOPHORECTOMY  11/27/2010   along with melanoma mass per [facility] New Patient Packet  . TONSILLECTOMY  11/27/1950   per [facility] New Patient Packet   Current Outpatient Medications:  .  amLODipine (NORVASC) 5 MG tablet, Take 1 tablet (5 mg total) by mouth daily., Disp: 90 tablet, Rfl: 3 .  econazole nitrate 1 % cream, 1 application. , Disp: , Rfl:  .  mesalamine (LIALDA) 1.2 g EC tablet, Take 4.8 g by mouth daily., Disp: , Rfl:  .  metroNIDAZOLE (METROCREAM) 0.75 % cream, Apply 1 application topically daily. , Disp: , Rfl:  .  rosuvastatin (CRESTOR) 10 MG tablet, Take 1 tablet (10 mg total) by mouth daily., Disp: 90 tablet, Rfl: 3 .  SYSTANE COMPLETE 0.6 % SOLN, , Disp: , Rfl:  .  traZODone (DESYREL) 100 MG tablet, Take 1 tablet (100 mg total) by mouth at bedtime., Disp: 90 tablet, Rfl: 3 .  Turmeric 500 MG CAPS, Take 1 capsule by mouth 2 (two) times daily., Disp: , Rfl:     Family History  Problem Relation Age of Onset  . Alzheimer's disease Mother   . Lung cancer Maternal Grandfather   . Heart attack Paternal Grandfather   . Diabetes Neg Hx      Social History   Tobacco Use  . Smoking status: Former Smoker    Packs/day: 1.00    Types: Cigarettes    Quit date: 11/27/1989    Years since quitting: 30.4  . Smokeless tobacco: Never Used  Substance Use Topics  . Alcohol use: Yes    Comment: 1 Drink a month  . Drug use: Never    Allergies as of 04/27/2020 - Review Complete 04/27/2020  Allergen Reaction Noted  . Atorvastatin  01/13/2017    Review of Systems:    All systems reviewed and negative except where noted in HPI.   Physical Exam:  BP 128/75 (BP Location: Left Arm, Patient Position: Sitting, Cuff Size: Normal)   Pulse 81   Temp (!) 97.2 F (36.2 C) (Oral)   Ht 5' 4"  (1.626 m)   Wt 160 lb 4 oz (72.7 kg)   BMI 27.51 kg/m  No LMP recorded. Patient has had a hysterectomy.  General:   Alert,  Well-developed, well-nourished, pleasant and  cooperative in NAD Head:  Normocephalic and atraumatic. Eyes:  Sclera clear, no icterus.   Conjunctiva pink. Ears:  Normal auditory acuity. Nose:  No deformity, discharge, or lesions. Mouth:  No deformity or lesions,oropharynx pink & moist. Neck:  Supple; no masses or thyromegaly. Lungs:  Respirations even and unlabored.  Clear throughout to auscultation.   No wheezes, crackles,  or rhonchi. No acute distress. Heart:  Regular rate and rhythm; no murmurs, clicks, rubs, or gallops. Abdomen:  Normal bowel sounds. Soft, non-tender and moderately distended, tympanic to percussion without masses, hepatosplenomegaly or hernias noted.  No guarding or rebound tenderness.   Rectal: Not performed Msk:  Symmetrical without gross deformities. Good, equal movement & strength bilaterally. Pulses:  Normal pulses noted. Extremities:  No clubbing or edema.  No cyanosis. Neurologic:  Alert and oriented x3;  grossly normal neurologically. Skin:  Intact without significant lesions or rashes. No jaundice. Psych:  Alert and cooperative. Normal mood and affect.  Imaging Studies: Reviewed  Assessment and Plan:   Barbara Burgess is a 73 y.o. female with history of left-sided UC, diagnosed in 2006, maintained on Lialda 4.8 g daily currently in histologic remission is seen in consultation for 3 to 4 weeks history of nonbloody diarrhea with abdominal cramps and bloating  Recommend stool studies to rule out infection Discussed about possible lactose intolerance Discussed about possible bacterial overgrowth Based on above work-up, if symptoms are persisting, advised on lactose-free diet.  If still persistent, will try 2 weeks course of rifaximin.  If still persistent, will pursue other work-up   Follow up in 4 to 6 weeks   Cephas Darby, MD 23.

## 2020-04-30 LAB — GI PROFILE, STOOL, PCR

## 2020-05-02 ENCOUNTER — Encounter: Payer: Self-pay | Admitting: Gastroenterology

## 2020-05-06 ENCOUNTER — Encounter: Payer: Self-pay | Admitting: Podiatry

## 2020-05-06 ENCOUNTER — Ambulatory Visit (INDEPENDENT_AMBULATORY_CARE_PROVIDER_SITE_OTHER): Payer: Medicare PPO | Admitting: Podiatry

## 2020-05-06 ENCOUNTER — Other Ambulatory Visit: Payer: Self-pay

## 2020-05-06 DIAGNOSIS — M2010 Hallux valgus (acquired), unspecified foot: Secondary | ICD-10-CM

## 2020-05-06 DIAGNOSIS — Q828 Other specified congenital malformations of skin: Secondary | ICD-10-CM

## 2020-05-06 DIAGNOSIS — M2580 Other specified joint disorders, unspecified joint: Secondary | ICD-10-CM

## 2020-05-06 NOTE — Progress Notes (Signed)
This patient presents the office for an evaluation of her left foot.  She states that she had a severely swollen left forefoot under her big toe joint 3 days ago.  She says she had difficulty bearing weight at this site.  She presents the office today stating she is not having any pain or discomfort at that site and it has completely resolved.  Patient states that she does have severe bunions and a painful callus on her left forefoot.  She says these can cause pain in her foot by the end of the day.  She says she is not interested in surgical correction but would prefer to be treated with a new orthoses to control her pain.  Patient does have a callus on her left forefoot which is painful walking wearing her shoes.  She presents the office today for an evaluation and treatment of her feet.  Vascular  Dorsalis pedis and posterior tibial pulses are palpable  B/L.  Capillary return  WNL.  Temperature gradient is  WNL.  Skin turgor  WNL  Sensorium  Senn Weinstein monofilament wire  WNL. Normal tactile sensation.  Nail Exam  Patient has normal nails with no evidence of bacterial or fungal infection.  Orthopedic  Exam  Muscle tone and muscle strength  WNL.  No limitations of motion feet  B/L.  No crepitus or joint effusion noted.  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  No redness or swelling with minimal pain upon palpation fibular sesamoid left foot.  Skin  No open lesions.  Normal skin texture and turgor.  Porokeratosis sub 2 left foot.  Porokeratosis secondary to plantar flexed second metatarsal left foot.  S/P sesamoiditis  Debride porokeratosis with # 15 blade.  Recommended she be evaluated by the pedorthist for new orthoses.  RTC prn.   Gardiner Barefoot DPM

## 2020-05-10 ENCOUNTER — Ambulatory Visit: Payer: Medicare PPO | Admitting: Podiatry

## 2020-05-14 ENCOUNTER — Encounter: Payer: Self-pay | Admitting: Gastroenterology

## 2020-05-26 ENCOUNTER — Ambulatory Visit (INDEPENDENT_AMBULATORY_CARE_PROVIDER_SITE_OTHER): Payer: Medicare PPO | Admitting: Orthotics

## 2020-05-26 ENCOUNTER — Other Ambulatory Visit: Payer: Self-pay

## 2020-05-26 DIAGNOSIS — M2010 Hallux valgus (acquired), unspecified foot: Secondary | ICD-10-CM | POA: Diagnosis not present

## 2020-05-26 DIAGNOSIS — Q828 Other specified congenital malformations of skin: Secondary | ICD-10-CM

## 2020-05-26 DIAGNOSIS — M2580 Other specified joint disorders, unspecified joint: Secondary | ICD-10-CM

## 2020-05-26 NOTE — Progress Notes (Signed)
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  Plan on f/o w/ kwedge to take pressure off forefoot.

## 2020-06-09 ENCOUNTER — Encounter: Payer: Self-pay | Admitting: Gastroenterology

## 2020-06-09 ENCOUNTER — Other Ambulatory Visit: Payer: Self-pay

## 2020-06-09 ENCOUNTER — Ambulatory Visit (INDEPENDENT_AMBULATORY_CARE_PROVIDER_SITE_OTHER): Payer: Medicare PPO | Admitting: Gastroenterology

## 2020-06-09 VITALS — BP 136/74 | HR 90 | Temp 98.1°F | Ht 64.0 in | Wt 159.2 lb

## 2020-06-09 DIAGNOSIS — K5130 Ulcerative (chronic) rectosigmoiditis without complications: Secondary | ICD-10-CM | POA: Diagnosis not present

## 2020-06-09 DIAGNOSIS — R7401 Elevation of levels of liver transaminase levels: Secondary | ICD-10-CM | POA: Diagnosis not present

## 2020-06-09 DIAGNOSIS — K9041 Non-celiac gluten sensitivity: Secondary | ICD-10-CM | POA: Diagnosis not present

## 2020-06-09 NOTE — Progress Notes (Signed)
Cephas Darby, MD [address]  [place]  [place], [place] [address]  Main: [phone]  Fax: [phone]    Gastroenterology Consultation  Referring Provider:     Lauree Chandler, NP Primary Care Physician:  Lauree Chandler, NP Primary Gastroenterologist:  Dr. Geoffery Lyons Reason for Consultation:     Left-sided UC        HPI:   Barbara Burgess is a 74 y.o. female referred by Dr. Dewaine Oats, Carlos American, NP  for consultation & management of left-sided UC.  Patient has history of ulcerative proctosigmoiditis since 2006 who was maintained in remission on Canasa, she was in histologic remission on 09/15/2018, followed by flareup, subsequently underwent colonoscopy in 02/2018 which revealed chronic mildly active proctosigmoiditis, since then she has been on Lialda 4.8 g daily.  She recently underwent repeat colonoscopy in 02/2020 due to flareup of diarrhea and abdominal cramps which revealed unremarkable right and left colon biopsies with no evidence of dysplasia.  Her symptoms resolved after colonoscopy.  However, for last 3 to 4 weeks patient has been experiencing several episodes of nonbloody diarrhea 5-6 times daily associated with abdominal cramps and bloating.  She denies any abdominal pain.  She denies weight loss.  Patient moved from [place] to [facility].  Therefore, she was referred by Dr. Flora Lipps to me to establish care for her IBD Patient is active, exercises daily.  Her GI symptoms are interfering with her routine exercise  No evidence of anemia  Follow-up visit 06/09/2020 Patient reports that her diarrhea has resolved.  Her stool studies were negative for infectious etiology.  She went on lactose-free diet, diarrhea resolved for 1 week, later recurred.  She then started to avoid, limited intake of gluten.  Her diarrhea resolved completely.  She currently has 1-2 formed bowel movements per day.  She denies any other GI symptoms.  She is pleased and feels  comfortable following lactose-free and gluten-free diet at this time  NSAIDs: None  Antiplts/Anticoagulants/Anti thrombotics: None  GI Procedures: Colonoscopy 4-21 - The examined portion of the ileum was normal.             - Normal mucosa in the entire examined colon. Biopsied.             - Diverticulosis in the sigmoid colon.             No signs of active colitis.  A: Colon, ascending, biopsy Histologically-unremarkable colonic mucosa No granulomas, viral cytopathic effect, or dysplasia identified   B: Colon, descending, biopsy Histologically-unremarkable colonic mucosa No granulomas, viral cytopathic effect, or dysplasia identified   Past Medical History:  Diagnosis Date  . Arthritis    knee and hip,  per [facility] New Patient Packet.   . Bilateral bunions   . 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[place])     per [facility] New Patient Packet.   . Colitis   . H/O CT scan of chest 11/27/2018   By Berle Mull, per [facility] New Patient Packet.   . H/O mammogram 11/27/2018   By Arvilla Market at [facility], per psc New Patient Packet.   Marland Kitchen History of CT scan of abdomen 11/27/2018   By Berle Mull, per [facility] New Patient Packet.   Marland Kitchen History of melanoma     per [facility] New Patient Packet.   Marland Kitchen History of upper extremity x-ray 11/27/2018   By Bridgette Habermann, of Shoulder, per [facility] New Patient Packet.   Marland Kitchen History of x-ray of hip 11/27/2018  By Bridgette Habermann, of Hip, per [facility] New Patient Packet.   . Hyperlipemia     per [facility] New Patient Packet.   . Hypertension     per [facility] New Patient Packet.   . Impingement syndrome of shoulder     per [facility] New Patient Packet.   . Incontinence    Mild Nighttime per [facility] New Patient Packet.  . Insomnia   . Melanoma ([place])   . Osteoarthritis of multiple joints   . Rosacea     per [facility] New Patient Packet.   . Sleep disorder     Past Surgical History:  Procedure Laterality Date  . ABDOMINAL HYSTERECTOMY  11/28/1987   Partial per  [facility] New Patient Packet  . ARTHROSCOPIC REPAIR ACL Left 11/28/2003   Left Knee/ Torn Medial Meniscus per [facility] New Patient Packet  . BREAST BIOPSY    . CATARACT EXTRACTION W/ INTRAOCULAR LENS IMPLANT Bilateral   . COLONOSCOPY  11/27/2016   By Elspeth Cho,  per [facility] New Patient Packet.   . COLONOSCOPY  11/27/2017   By Elspeth Cho, per [facility]sc New Patient Packet.   Marland Kitchen HYSTEROTOMY  11/28/1987   per [facility] New Patient Packet  . KNEE SURGERY    . MELANOMA EXCISION  11/27/1998   Right Shin per [facility] New Patient Packet  . OOPHORECTOMY  11/27/2010   along with melanoma mass per [facility] New Patient Packet  . TONSILLECTOMY  11/27/1950   per [facility] New Patient Packet   Current Outpatient Medications:  .  amLODipine (NORVASC) 5 MG tablet, Take 1 tablet (5 mg total) by mouth daily., Disp: 90 tablet, Rfl: 3 .  econazole nitrate 1 % cream, 1 application. , Disp: , Rfl:  .  mesalamine (LIALDA) 1.2 g EC tablet, Take 4.8 g by mouth daily., Disp: , Rfl:  .  metroNIDAZOLE (METROCREAM) 0.75 % cream, Apply 1 application topically daily. , Disp: , Rfl:  .  rosuvastatin (CRESTOR) 10 MG tablet, Take 1 tablet (10 mg total) by mouth daily., Disp: 90 tablet, Rfl: 3 .  SYSTANE COMPLETE 0.6 % SOLN, , Disp: , Rfl:  .  traZODone (DESYREL) 100 MG tablet, Take 1 tablet (100 mg total) by mouth at bedtime., Disp: 90 tablet, Rfl: 3 .  Turmeric 500 MG CAPS, Take 1 capsule by mouth 2 (two) times daily., Disp: , Rfl:     Family History  Problem Relation Age of Onset  . Alzheimer's disease Mother   . Lung cancer Maternal Grandfather   . Heart attack Paternal Grandfather   . Diabetes Neg Hx      Social History   Tobacco Use  . Smoking status: Former Smoker    Packs/day: 1.00    Types: Cigarettes    Quit date: 11/27/1989    Years since quitting: 30.5  . Smokeless tobacco: Never Used  Substance Use Topics  . Alcohol use: Yes    Comment: 1 Drink a month  . Drug use: Never    Allergies as of 06/09/2020 - Review Complete  06/09/2020  Allergen Reaction Noted  . Atorvastatin  01/13/2017  . Lactase Diarrhea 04/27/2020    Review of Systems:    All systems reviewed and negative except where noted in HPI.   Physical Exam:  BP 136/74 (BP Location: Left Arm, Patient Position: Sitting, Cuff Size: Normal)   Pulse 90   Temp 98.1 F (36.7 C) (Oral)   Ht 5' 4"  (1.626 m)   Wt 159 lb 4 oz (72.2 kg)  BMI 27.34 kg/m  No LMP recorded. Patient has had a hysterectomy.  General:   Alert,  Well-developed, well-nourished, pleasant and cooperative in NAD Head:  Normocephalic and atraumatic. Eyes:  Sclera clear, no icterus.   Conjunctiva pink. Ears:  Normal auditory acuity. Nose:  No deformity, discharge, or lesions. Mouth:  No deformity or lesions,oropharynx pink & moist. Neck:  Supple; no masses or thyromegaly. Lungs:  Respirations even and unlabored.  Clear throughout to auscultation.   No wheezes, crackles, or rhonchi. No acute distress. Heart:  Regular rate and rhythm; no murmurs, clicks, rubs, or gallops. Abdomen:  Normal bowel sounds. Soft, non-tender and moderately distended, tympanic to percussion without masses, hepatosplenomegaly or hernias noted.  No guarding or rebound tenderness.   Rectal: Not performed Msk:  Symmetrical without gross deformities. Good, equal movement & strength bilaterally. Pulses:  Normal pulses noted. Extremities:  No clubbing or edema.  No cyanosis. Neurologic:  Alert and oriented x3;  grossly normal neurologically. Skin:  Intact without significant lesions or rashes. No jaundice. Psych:  Alert and cooperative. Normal mood and affect.  Imaging Studies: Reviewed  Assessment and Plan:   TEREASA YILMAZ is a 74 y.o. female with history of left-sided UC, diagnosed in 2006, maintained on Lialda 4.8 g daily currently in histologic remission is seen for follow-up of 3 to 4 weeks history of nonbloody diarrhea with abdominal cramps and bloating.  Stool studies are negative for infection.   Patient's symptoms are currently under control by following lactose-free and reduced gluten intake  Recommend celiac disease panel as a screening for celiac disease  Recommend surveillance colonoscopy in 2024   Follow up in 6 months   Cephas Darby, MD

## 2020-06-11 LAB — CELIAC DISEASE PANEL
Endomysial IgA: NEGATIVE
IgA/Immunoglobulin A, Serum: 159 mg/dL (ref 64–422)
Transglutaminase IgA: <2 U/mL (ref 0–3)

## 2020-06-30 ENCOUNTER — Ambulatory Visit: Payer: Medicare PPO | Admitting: Orthotics

## 2020-06-30 ENCOUNTER — Other Ambulatory Visit: Payer: Self-pay

## 2020-06-30 DIAGNOSIS — M2580 Other specified joint disorders, unspecified joint: Secondary | ICD-10-CM

## 2020-06-30 DIAGNOSIS — M2010 Hallux valgus (acquired), unspecified foot: Secondary | ICD-10-CM

## 2020-06-30 NOTE — Progress Notes (Signed)
Patient came in today to pick up custom made foot orthotics.  The goals were accomplished and the patient reported no dissatisfaction with said orthotics.  Patient was advised of breakin period and how to report any issues. 

## 2020-07-29 ENCOUNTER — Ambulatory Visit: Payer: Self-pay | Admitting: Nurse Practitioner

## 2020-08-10 ENCOUNTER — Encounter: Payer: Self-pay | Admitting: Nurse Practitioner

## 2020-08-10 ENCOUNTER — Ambulatory Visit: Payer: Medicare PPO | Admitting: Nurse Practitioner

## 2020-08-10 ENCOUNTER — Other Ambulatory Visit: Payer: Self-pay

## 2020-08-10 VITALS — BP 110/80 | HR 65 | Temp 97.4°F | Ht 64.0 in | Wt 160.0 lb

## 2020-08-10 DIAGNOSIS — I10 Essential (primary) hypertension: Secondary | ICD-10-CM

## 2020-08-10 DIAGNOSIS — F5101 Primary insomnia: Secondary | ICD-10-CM | POA: Diagnosis not present

## 2020-08-10 DIAGNOSIS — M8949 Other hypertrophic osteoarthropathy, multiple sites: Secondary | ICD-10-CM | POA: Diagnosis not present

## 2020-08-10 DIAGNOSIS — E785 Hyperlipidemia, unspecified: Secondary | ICD-10-CM | POA: Diagnosis not present

## 2020-08-10 DIAGNOSIS — K51819 Other ulcerative colitis with unspecified complications: Secondary | ICD-10-CM

## 2020-08-10 DIAGNOSIS — Z85820 Personal history of malignant melanoma of skin: Secondary | ICD-10-CM

## 2020-08-10 DIAGNOSIS — M159 Polyosteoarthritis, unspecified: Secondary | ICD-10-CM

## 2020-08-10 NOTE — Progress Notes (Signed)
Careteam: Patient Care Team: Lauree Chandler, NP as PCP - General (Geriatric Medicine) Audrie Lia, MD as Referring Physician (Dermatology) Brain Hilts, MD as Referring Physician (Gastroenterology) [facility], [facility]) [place] Park, MD as Referring Physician (Orthopedic Surgery)  Advanced Directive information Does Patient Have a Medical Advance Directive?: Yes, Type of Advance Directive: [place];Living will;Out of facility DNR (pink MOST or yellow form), Pre-existing out of facility DNR order (yellow form or pink MOST form): Yellow form placed in chart (order not valid for inpatient use);Pink MOST form placed in chart (order not valid for inpatient use), Does patient want to make changes to medical advance directive?: No - Patient declined  Allergies  Allergen Reactions   Atorvastatin     Elevated Liver Enzymes.    Gluten Meal    Lactase Diarrhea    Chief Complaint  Patient presents with   Medical Management of Chronic Issues    6 month follow up visit. Patient states she is now dairy, gluten free.    Best Practice Recommendations    Flu vaccin, 2nd COVID-19 vaccine   Quality Metric Gaps    Colonoscopy, DEXA scan   Altered Mental Status     HPI: Patient is a 74 y.o. female seen in today at the [facility] for routine follow up.   2 days ago was the 5th anniversary of her husband death- every year is hard around that time. Better after the day goes by  More trouble with left knee- saw orthopedic- left knee bows inward, decided to get a knee brace and at some point has thinks she will need a knee replacement but due to her cancer hx and lymph node removal she is trying to avoid surgery.  Fit for brace and getting it tomorrow. Very light weight but will help stabilize her knee. Will use aleve Rarely.   Ulcerative colitis having a lot of bowel issues- followed by GI- now has local GI doctor. Stopped dairy and  bread. Restarted bread and symptoms worsened.  Now she is doing gluten free and dairy free. Getting enough protein and eating fruit and vegetables.   Doing yoga and water aerobics twice a week.  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   Has stopped CT scan yearly due to being 9 years out with stable scans and following with oncologist yearly and melanoma specialist routinely.   Insomnia- sleeping well.   Review of Systems:  Review of Systems  Constitutional: Negative for chills, fever and weight loss.  HENT: Negative for tinnitus.   Respiratory: Negative for cough, sputum production and shortness of breath.   Cardiovascular: Negative for chest pain, palpitations and leg swelling.  Gastrointestinal: Negative for abdominal pain, constipation, diarrhea and heartburn.  Genitourinary: Negative for dysuria, frequency and urgency.  Musculoskeletal: Positive for joint pain. Negative for back pain, falls and myalgias.  Skin: Negative.   Neurological: Negative for dizziness and headaches.  Psychiatric/Behavioral: Negative for depression and memory loss. The patient does not have insomnia.     Past Medical History:  Diagnosis Date   Arthritis    knee and hip,  per [facility] New Patient Packet.    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    Colitis    H/O CT scan of chest 11/27/2018  By Berle Mull, per [facility] New Patient Packet.    H/O mammogram 11/27/2018   By Arvilla Market at [facility], per psc New Patient Packet.    History of CT scan of abdomen 11/27/2018   By Berle Mull, per [facility] New Patient Packet.    History of melanoma     per [facility] New Patient Packet.    History of upper extremity x-ray 11/27/2018   By Bridgette Habermann, of Shoulder, per [facility] New Patient Packet.    History of x-ray of hip 11/27/2018   By Bridgette Habermann,  of Hip, per [facility] New Patient Packet.    Hyperlipemia     per [facility] New Patient Packet.    Hypertension     per [facility] New Patient Packet.    Impingement syndrome of shoulder     per [facility] New Patient Packet.    Incontinence    Mild Nighttime per [facility] New Patient Packet.   Insomnia    Melanoma ([place])    Osteoarthritis of multiple joints    Rosacea     per [facility] New Patient Packet.    Sleep disorder    Past Surgical History:  Procedure Laterality Date   ABDOMINAL HYSTERECTOMY  11/28/1987   Partial per [facility] New Patient Packet   ARTHROSCOPIC REPAIR ACL Left 11/28/2003   Left Knee/ Torn Medial Meniscus per [facility] New Patient Packet   BREAST BIOPSY     CATARACT EXTRACTION W/ INTRAOCULAR LENS IMPLANT Bilateral    COLONOSCOPY  11/27/2016   By Elspeth Cho,  per [facility] New Patient Packet.    COLONOSCOPY  11/27/2017   By Elspeth Cho, per [facility] New Patient Packet.    HYSTEROTOMY  11/28/1987   per [facility] New Patient Packet   KNEE SURGERY     MELANOMA EXCISION  11/27/1998   Right Shin per [facility] New Patient Packet   OOPHORECTOMY  11/27/2010   along with melanoma mass per [facility] New Patient Packet   TONSILLECTOMY  11/27/1950   per [facility] New Patient Packet   Social History:   reports that she quit smoking about 30 years ago. Her smoking use included cigarettes. She smoked 1.00 pack per day. She has never used smokeless tobacco. She reports current alcohol use. She reports that she does not use drugs.  Family History  Problem Relation Age of Onset   Alzheimer's disease Mother    Lung cancer Maternal Grandfather    Heart attack Paternal Grandfather    Diabetes Neg Hx     Medications: Patient's Medications  New Prescriptions   No medications on file  Previous Medications   AMLODIPINE (NORVASC) 5 MG TABLET    Take 1 tablet (5 mg total) by mouth daily.   ECONAZOLE NITRATE 1 % CREAM    1 application.    MESALAMINE (LIALDA) 1.2 G EC TABLET    Take 4.8 g by mouth daily.   METRONIDAZOLE  (METROCREAM) 0.75 % CREAM    Apply 1 application topically daily.    ROSUVASTATIN (CRESTOR) 10 MG TABLET    Take 1 tablet (10 mg total) by mouth daily.   SYSTANE COMPLETE 0.6 % SOLN       TRAZODONE (DESYREL) 100 MG TABLET    Take 1 tablet (100 mg total) by mouth at bedtime.   TURMERIC 500 MG CAPS    Take 1 capsule by mouth 2 (two) times daily.  Modified Medications   No medications on file  Discontinued Medications   No medications on file    Physical Exam:  Vitals:   08/10/20 1501  BP: 110/80  Pulse: 65  Temp: (!) 97.4 F (36.3 C)  TempSrc: Temporal  SpO2: 97%  Weight: 160 lb (72.6 kg)  Height: 5' 4"  (1.626 m)   Body mass index is 27.46 kg/m. Wt Readings from Last 3 Encounters:  08/10/20 160 lb (72.6 kg)  06/09/20 159 lb 4 oz (72.2 kg)  04/27/20 160 lb 4 oz (72.7 kg)    Physical Exam Constitutional:      General: She is not in acute distress.    Appearance: She is well-developed. She is not diaphoretic.  HENT:     Head: Normocephalic and atraumatic.     Mouth/Throat:     Pharynx: No oropharyngeal exudate.  Eyes:     Conjunctiva/sclera: Conjunctivae normal.     Pupils: Pupils are equal, round, and reactive to light.  Cardiovascular:     Rate and Rhythm: Normal rate and regular rhythm.     Heart sounds: Normal heart sounds.  Pulmonary:     Effort: Pulmonary effort is normal.     Breath sounds: Normal breath sounds.  Abdominal:     General: Bowel sounds are normal.     Palpations: Abdomen is soft.  Musculoskeletal:        General: No tenderness.     Cervical back: Normal range of motion and neck supple.  Skin:    General: Skin is warm and dry.  Neurological:     Mental Status: She is alert and oriented to person, place, and time.    Labs reviewed: Basic Metabolic Panel: Recent Labs    12/18/19 0000  NA 143  K 5.1  CL 107  CO2 24*  BUN 15  CREATININE 0.7   Liver Function Tests: Recent Labs    12/18/19 0000 03/18/20 0000  AST 30 29  ALT 45*  50*  ALKPHOS 97 88  ALBUMIN 4.3 4.3   No results for input(s): LIPASE, AMYLASE in the last 8760 hours. No results for input(s): AMMONIA in the last 8760 hours. CBC: Recent Labs    12/18/19 0000  WBC 5.1  NEUTROABS 2,769  HGB 14.7  HCT 45  PLT 23*   Lipid Panel: Recent Labs    12/18/19 0000 03/18/20 0000  CHOL 229* 201*  HDL  --  66  LDLCALC 133 108  TRIG 121 158   TSH: No results for input(s): TSH in the last 8760 hours. A1C: No results found for: HGBA1C   Assessment/Plan 1. Hyperlipidemia, unspecified hyperlipidemia type Continues on crestor, without side effect noted. Continue with dietary modifications.   2. Primary insomnia Continues on trazodone   3. Essential hypertension -controlled on norvasc 5 mg daily   4. Primary osteoarthritis involving multiple joints Ongoing knee pain, plans to get brace to avoid surgical intervention   5. Other ulcerative colitis with complication ([place]) Stable with dietary modifications. Continue lifestyle modifications.   6. History of melanoma Ongoing follow up with dermatology   Next appt: 6 months, labs prior to next visit.  Carlos American. [place], [facility] [phone]

## 2020-08-10 NOTE — Patient Instructions (Addendum)
Will get fasting lab work 08/12/20  Bone Density will be re-faxed to the [facility] imaging center

## 2020-08-11 ENCOUNTER — Ambulatory Visit: Payer: Medicare PPO | Admitting: Nurse Practitioner

## 2020-08-12 ENCOUNTER — Ambulatory Visit: Payer: Medicare PPO | Admitting: Nurse Practitioner

## 2020-08-12 LAB — LIPID PANEL
Cholesterol: 212 — AB (ref 0–200)
LDL Cholesterol: 110
Triglycerides: 164 — AB (ref 40–160)

## 2020-08-12 LAB — CBC: RBC: 4.78 (ref 3.87–5.11)

## 2020-08-12 LAB — BASIC METABOLIC PANEL
BUN: 13 (ref 4–21)
CO2: 28 — AB (ref 13–22)
Chloride: 105 (ref 99–108)
Glucose: 100
Potassium: 4.2 (ref 3.4–5.3)
Sodium: 140 (ref 137–147)

## 2020-08-12 LAB — CBC AND DIFFERENTIAL
HCT: 43 (ref 36–46)
Hemoglobin: 14.7 (ref 12.0–16.0)
Neutrophils Absolute: 2550
Platelets: 202 (ref 150–399)
WBC: 5

## 2020-08-12 LAB — HEPATIC FUNCTION PANEL
ALT: 57 — AB (ref 7–35)
AST: 36 — AB (ref 13–35)
Alkaline Phosphatase: 85 (ref 25–125)
Bilirubin, Total: 0.6

## 2020-08-12 LAB — COMPREHENSIVE METABOLIC PANEL
Albumin: 4.2 (ref 3.5–5.0)
Calcium: 9.4 (ref 8.7–10.7)
GFR calc Af Amer: 76
GFR calc non Af Amer: 66
Globulin: 2.2

## 2020-08-19 ENCOUNTER — Encounter: Payer: Self-pay | Admitting: Nurse Practitioner

## 2020-08-19 NOTE — Telephone Encounter (Signed)
Covid Booster and Flu Vaccine documented in patient's chart.

## 2020-08-26 ENCOUNTER — Encounter: Payer: Self-pay | Admitting: Nurse Practitioner

## 2020-08-26 NOTE — Telephone Encounter (Signed)
Refer to [employer] dated 08/19/2020, Janett Billow replied to patient today.

## 2020-09-01 ENCOUNTER — Ambulatory Visit
Admission: RE | Admit: 2020-09-01 | Discharge: 2020-09-01 | Disposition: A | Discharge status: Home / Self Care | Payer: Medicare PPO | Source: Ambulatory Visit | Attending: Nurse Practitioner | Admitting: Nurse Practitioner

## 2020-09-01 ENCOUNTER — Other Ambulatory Visit: Payer: Self-pay

## 2020-09-01 DIAGNOSIS — E2839 Other primary ovarian failure: Secondary | ICD-10-CM | POA: Diagnosis not present

## 2020-09-21 ENCOUNTER — Encounter: Payer: Self-pay | Admitting: Nurse Practitioner

## 2020-09-23 ENCOUNTER — Encounter: Payer: Self-pay | Admitting: Nurse Practitioner

## 2020-10-13 ENCOUNTER — Encounter: Payer: Self-pay | Admitting: Nurse Practitioner

## 2020-10-27 ENCOUNTER — Encounter: Payer: Self-pay | Admitting: Gastroenterology

## 2020-10-27 MED ORDER — MESALAMINE 1.2 G PO TBEC: 4.8000 g | DELAYED_RELEASE_TABLET | Freq: Every day | ORAL | 0 refills | Status: DC

## 2020-10-27 NOTE — Telephone Encounter (Signed)
Mychart message:  It seems that since I changed GI doctors, the refill I now need is no longer being honored by my former [facility] GI doctor. This is the message I received this morning in an e-mail from [facility]:  "Waiting for [place] Before we can ship your medicine, we need to talk with your doctor's office to help [place] figure out how this medicine will safely work in your case. We have reached out multiple times but haven't heard back yet. You might want to contact their office to speed this process up so your order is not delayed any further. We'll alert you if we can't send the medicine."

## 2020-10-27 NOTE — Telephone Encounter (Signed)
Last office visit 06/09/2020 gluten intolerance  unc filled medication on 08/05/2020 360 tablets  with 3 refills

## 2020-11-25 ENCOUNTER — Encounter: Payer: Self-pay | Admitting: Gastroenterology

## 2021-02-08 ENCOUNTER — Ambulatory Visit: Payer: Self-pay | Admitting: Nurse Practitioner

## 2021-02-10 ENCOUNTER — Ambulatory Visit: Payer: Self-pay | Admitting: Nurse Practitioner

## 2021-03-17 ENCOUNTER — Encounter: Payer: Self-pay | Admitting: Gastroenterology

## 2021-03-17 MED ORDER — MESALAMINE 1.2 G PO TBEC: 4.8000 g | DELAYED_RELEASE_TABLET | Freq: Every day | ORAL | 0 refills | Status: DC

## 2021-03-30 ENCOUNTER — Other Ambulatory Visit: Payer: Self-pay

## 2021-03-30 ENCOUNTER — Ambulatory Visit (INDEPENDENT_AMBULATORY_CARE_PROVIDER_SITE_OTHER): Payer: Medicare PPO | Admitting: Gastroenterology

## 2021-03-30 ENCOUNTER — Encounter: Payer: Self-pay | Admitting: Gastroenterology

## 2021-03-30 ENCOUNTER — Telehealth: Payer: Self-pay

## 2021-03-30 VITALS — BP 112/74 | HR 97 | Temp 98.1°F | Ht 64.0 in | Wt 167.5 lb

## 2021-03-30 DIAGNOSIS — K5130 Ulcerative (chronic) rectosigmoiditis without complications: Secondary | ICD-10-CM | POA: Diagnosis not present

## 2021-03-30 NOTE — Telephone Encounter (Signed)
Patient needs recall in 1 year for F/U

## 2021-03-30 NOTE — Progress Notes (Signed)
Barbara Darby, MD [address]  [place]  [place], [place] [address]  Main: [phone]  Fax: [phone]    Gastroenterology Consultation  Referring Provider:     No ref. provider found Primary Care Physician:  Barbara Ramsay, MD Primary Gastroenterologist:  Dr. Geoffery Burgess Reason for Consultation:     Left-sided UC        HPI:   Barbara Burgess is a 75 y.o. female referred by Dr. Ola Spurr, Cheral Marker, MD  for consultation & management of left-sided UC.  Patient has history of ulcerative proctosigmoiditis since 2006 who was maintained in remission on Canasa, she was in histologic remission on 09/15/2018, followed by flareup, subsequently underwent colonoscopy in 02/2018 which revealed chronic mildly active proctosigmoiditis, since then she has been on Lialda 4.8 g daily.  She recently underwent repeat colonoscopy in 02/2020 due to flareup of diarrhea and abdominal cramps which revealed unremarkable right and left colon biopsies with no evidence of dysplasia.  Her symptoms resolved after colonoscopy.  However, for last 3 to 4 weeks patient has been experiencing several episodes of nonbloody diarrhea 5-6 times daily associated with abdominal cramps and bloating.  She denies any abdominal pain.  She denies weight loss.  Patient moved from [place] to [facility].  Therefore, she was referred by Dr. Flora Lipps to me to establish care for her IBD Patient is active, exercises daily.  Her GI symptoms are interfering with her routine exercise  No evidence of anemia  Follow-up visit 06/09/2020 Patient reports that her diarrhea has resolved.  Her stool studies were negative for infectious etiology.  She went on lactose-free diet, diarrhea resolved for 1 week, later recurred.  She then started to avoid, limited intake of gluten.  Her diarrhea resolved completely.  She currently has 1-2 formed bowel movements per day.  She denies any other GI symptoms.  She is pleased and feels  comfortable following lactose-free and gluten-free diet at this time  Follow-up visit 03/31/2019 Patient reports doing well with regards to UC.  She is currently taking Lialda 4.8 g daily.  She does not have any GI concerns today.  NSAIDs: None  Antiplts/Anticoagulants/Anti thrombotics: None  GI Procedures: Colonoscopy 4-21 - The examined portion of the ileum was normal.             - Normal mucosa in the entire examined colon. Biopsied.             - Diverticulosis in the sigmoid colon.             No signs of active colitis.  A: Colon, ascending, biopsy Histologically-unremarkable colonic mucosa No granulomas, viral cytopathic effect, or dysplasia identified   B: Colon, descending, biopsy Histologically-unremarkable colonic mucosa No granulomas, viral cytopathic effect, or dysplasia identified   Past Medical History:  Diagnosis Date  . Arthritis    knee and hip,  per [facility] New Patient Packet.   . Bilateral bunions   . 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   . Colitis   . H/O CT scan of chest 11/27/2018   By Berle Mull, per [facility] New Patient Packet.   . H/O mammogram 11/27/2018   By Arvilla Market at [facility], per psc New Patient Packet.   Marland Kitchen History of CT scan of abdomen 11/27/2018   By Berle Mull, per [facility] New Patient Packet.   Marland Kitchen History of melanoma     per [facility] New Patient Packet.   Marland Kitchen  History of upper extremity x-ray 11/27/2018   By Bridgette Habermann, of Shoulder, per [facility]sc New Patient Packet.   Marland Kitchen History of x-ray of hip 11/27/2018   By Bridgette Habermann, of Hip, per [facility]sc New Patient Packet.   . Hyperlipemia     per psc New Patient Packet.   . Hypertension     per [facility] New Patient Packet.   . Impingement syndrome of shoulder     per [facility] New Patient Packet.   . Incontinence    Mild Nighttime per [facility] New Patient Packet.  . Insomnia   . Melanoma ([place])   . Osteoarthritis of multiple joints   . Rosacea      per [facility] New Patient Packet.   . Sleep disorder     Past Surgical History:  Procedure Laterality Date  . ABDOMINAL HYSTERECTOMY  11/28/1987   Partial per [facility] New Patient Packet  . ARTHROSCOPIC REPAIR ACL Left 11/28/2003   Left Knee/ Torn Medial Meniscus per [facility] New Patient Packet  . BREAST BIOPSY    . CATARACT EXTRACTION W/ INTRAOCULAR LENS IMPLANT Bilateral   . COLONOSCOPY  11/27/2016   By Elspeth Cho,  per [facility] New Patient Packet.   . COLONOSCOPY  11/27/2017   By Elspeth Cho, per [facility] New Patient Packet.   Marland Kitchen HYSTEROTOMY  11/28/1987   per [facility] New Patient Packet  . KNEE SURGERY    . MELANOMA EXCISION  11/27/1998   Right Shin per [facility] New Patient Packet  . OOPHORECTOMY  11/27/2010   along with melanoma mass per [facility] New Patient Packet  . TONSILLECTOMY  11/27/1950   per [facility]cs New Patient Packet   Current Outpatient Medications:  .  amLODipine (NORVASC) 5 MG tablet, Take 1 tablet (5 mg total) by mouth daily., Disp: 90 tablet, Rfl: 3 .  Black Pepper-Turmeric 3-500 MG CAPS, Take by mouth., Disp: , Rfl:  .  Calcium Carbonate-Vitamin D 600-200 MG-UNIT TABS, Take by mouth., Disp: , Rfl:  .  econazole nitrate 1 % cream, 1 application. , Disp: , Rfl:  .  mesalamine (LIALDA) 1.2 g EC tablet, Take 4 tablets (4.8 g total) by mouth daily., Disp: 360 tablet, Rfl: 0 .  metroNIDAZOLE (METROCREAM) 0.75 % cream, Apply 1 application topically daily. , Disp: , Rfl:  .  rosuvastatin (CRESTOR) 10 MG tablet, Take 1 tablet (10 mg total) by mouth daily., Disp: 90 tablet, Rfl: 3 .  SYSTANE COMPLETE 0.6 % SOLN, , Disp: , Rfl:  .  traZODone (DESYREL) 100 MG tablet, Take 1 tablet (100 mg total) by mouth at bedtime., Disp: 90 tablet, Rfl: 3 .  Turmeric 500 MG CAPS, Take 1 capsule by mouth 2 (two) times daily., Disp: , Rfl:     Family History  Problem Relation Age of Onset  . Alzheimer's disease Mother   . Lung cancer Maternal Grandfather   . Heart attack Paternal Grandfather   . Diabetes Neg Hx       Social History   Tobacco Use  . Smoking status: Former Smoker    Packs/day: 1.00    Types: Cigarettes    Quit date: 11/27/1989    Years since quitting: 31.3  . Smokeless tobacco: Never Used  Substance Use Topics  . Alcohol use: Yes    Comment: 2 a week  . Drug use: Never    Allergies as of 03/30/2021 - Review Complete 03/30/2021  Allergen Reaction Noted  . Atorvastatin  01/13/2017  . Gluten meal  08/10/2020  . Lactase Diarrhea  04/27/2020    Review of Systems:    All systems reviewed and negative except where noted in HPI.   Physical Exam:  BP 112/74 (BP Location: Left Arm, Patient Position: Sitting, Cuff Size: Normal)   Pulse 97   Temp 98.1 F (36.7 C) (Oral)   Ht 5' 4"  (1.626 m)   Wt 167 lb 8 oz (76 kg)   BMI 28.75 kg/m  No LMP recorded. Patient has had a hysterectomy.  General:   Alert,  Well-developed, well-nourished, pleasant and cooperative in NAD Head:  Normocephalic and atraumatic. Eyes:  Sclera clear, no icterus.   Conjunctiva pink. Ears:  Normal auditory acuity. Nose:  No deformity, discharge, or lesions. Mouth:  No deformity or lesions,oropharynx pink & moist. Neck:  Supple; no masses or thyromegaly. Lungs:  Respirations even and unlabored.  Clear throughout to auscultation.   No wheezes, crackles, or rhonchi. No acute distress. Heart:  Regular rate and rhythm; no murmurs, clicks, rubs, or gallops. Abdomen:  Normal bowel sounds. Soft, non-tender and nondistended, patient without masses, hepatosplenomegaly or hernias noted.  No guarding or rebound tenderness.   Rectal: Not performed Msk:  Symmetrical without gross deformities. Good, equal movement & strength bilaterally. Pulses:  Normal pulses noted. Extremities:  No clubbing or edema.  No cyanosis. Neurologic:  Alert and oriented x3;  grossly normal neurologically. Skin:  Intact without significant lesions or rashes. No jaundice. Psych:  Alert and cooperative. Normal mood and affect.  Imaging  Studies: Reviewed  Assessment and Plan:   RAEANNE DESCHLER is a 75 y.o. female with history of left-sided UC, diagnosed in 2006, maintained on Lialda 4.8 g daily currently in histologic remission.  Advised patient to decrease Lialda to 3 pills a day for 1 month and if continues to remain asymptomatic, decrease to 2 pills daily and maintain at 2.4 g daily  Recommend surveillance colonoscopy in 2024   Follow up in 12 months   Barbara Darby, MD

## 2021-09-09 ENCOUNTER — Other Ambulatory Visit: Payer: Self-pay | Admitting: Infectious Diseases

## 2021-09-09 DIAGNOSIS — Z1231 Encounter for screening mammogram for malignant neoplasm of breast: Secondary | ICD-10-CM

## 2021-09-20 ENCOUNTER — Ambulatory Visit
Admission: RE | Admit: 2021-09-20 | Discharge: 2021-09-20 | Disposition: A | Discharge status: Home / Self Care | Payer: Medicare PPO | Source: Ambulatory Visit | Attending: Infectious Diseases | Admitting: Infectious Diseases

## 2021-09-20 ENCOUNTER — Other Ambulatory Visit: Payer: Self-pay

## 2021-09-20 DIAGNOSIS — Z1231 Encounter for screening mammogram for malignant neoplasm of breast: Secondary | ICD-10-CM | POA: Diagnosis present

## 2021-10-05 ENCOUNTER — Other Ambulatory Visit: Payer: Self-pay | Admitting: Gastroenterology

## 2021-10-05 ENCOUNTER — Encounter: Payer: Self-pay | Admitting: Gastroenterology

## 2021-10-05 ENCOUNTER — Other Ambulatory Visit: Payer: Self-pay

## 2021-10-05 MED ORDER — MESALAMINE 1.2 G PO TBEC: 4.8000 g | DELAYED_RELEASE_TABLET | Freq: Every day | ORAL | 0 refills | Status: DC

## 2021-10-05 NOTE — Progress Notes (Signed)
[facility] got [facility]ill sent in as requested called patient and let her know it will be coming in the mail

## 2021-10-10 ENCOUNTER — Encounter: Payer: Self-pay | Admitting: Gastroenterology

## 2021-10-10 ENCOUNTER — Telehealth: Payer: Self-pay

## 2021-10-10 NOTE — Telephone Encounter (Signed)
Called patient about her medicine questions she is still taking just 2 a day and appreciates the way it was ordered she states now she got 3 months extra and free of charge but is doing great with just the two pills a day and will see [place] at follow up appointment

## 2021-11-22 ENCOUNTER — Other Ambulatory Visit: Payer: Medicare PPO

## 2021-12-02 ENCOUNTER — Other Ambulatory Visit: Payer: Medicare PPO

## 2021-12-05 ENCOUNTER — Other Ambulatory Visit: Payer: Self-pay | Admitting: Orthopedic Surgery

## 2021-12-05 DIAGNOSIS — Z01818 Encounter for other preprocedural examination: Secondary | ICD-10-CM

## 2021-12-09 ENCOUNTER — Encounter
Admission: RE | Admit: 2021-12-09 | Discharge: 2021-12-09 | Disposition: A | Discharge status: Home / Self Care | Payer: Medicare PPO | Source: Ambulatory Visit | Attending: Orthopedic Surgery | Admitting: Orthopedic Surgery

## 2021-12-09 ENCOUNTER — Other Ambulatory Visit: Payer: Self-pay

## 2021-12-09 VITALS — BP 157/71 | HR 73 | Temp 98.3°F | Resp 14 | Ht 64.0 in | Wt 169.0 lb

## 2021-12-09 DIAGNOSIS — Z01818 Encounter for other preprocedural examination: Secondary | ICD-10-CM | POA: Diagnosis present

## 2021-12-09 DIAGNOSIS — Z01810 Encounter for preprocedural cardiovascular examination: Secondary | ICD-10-CM | POA: Diagnosis not present

## 2021-12-09 DIAGNOSIS — Z01812 Encounter for preprocedural laboratory examination: Secondary | ICD-10-CM

## 2021-12-09 LAB — APTT: aPTT: 29 seconds (ref 24–36)

## 2021-12-09 LAB — URINALYSIS, ROUTINE W REFLEX MICROSCOPIC
Bilirubin Urine: NEGATIVE
Glucose, UA: NEGATIVE mg/dL
Hgb urine dipstick: NEGATIVE
Ketones, ur: NEGATIVE mg/dL
Leukocytes,Ua: NEGATIVE
Nitrite: NEGATIVE
Protein, ur: NEGATIVE mg/dL
Specific Gravity, Urine: 1.016 (ref 1.005–1.030)
pH: 5 (ref 5.0–8.0)

## 2021-12-09 LAB — CBC WITH DIFFERENTIAL/PLATELET
Abs Immature Granulocytes: 0.03 10*3/uL (ref 0.00–0.07)
Basophils Absolute: 0.1 10*3/uL (ref 0.0–0.1)
Basophils Relative: 1 %
Eosinophils Absolute: 0.2 10*3/uL (ref 0.0–0.5)
Eosinophils Relative: 3 %
HCT: 42.8 % (ref 36.0–46.0)
Hemoglobin: 14.5 g/dL (ref 12.0–15.0)
Immature Granulocytes: 0 %
Lymphocytes Relative: 21 %
Lymphs Abs: 1.5 10*3/uL (ref 0.7–4.0)
MCH: 30.6 pg (ref 26.0–34.0)
MCHC: 33.9 g/dL (ref 30.0–36.0)
MCV: 90.3 fL (ref 80.0–100.0)
Monocytes Absolute: 0.5 10*3/uL (ref 0.1–1.0)
Monocytes Relative: 8 %
Neutro Abs: 4.6 10*3/uL (ref 1.7–7.7)
Neutrophils Relative %: 67 %
Platelets: 256 10*3/uL (ref 150–400)
RBC: 4.74 MIL/uL (ref 3.87–5.11)
RDW: 14.9 % (ref 11.5–15.5)
WBC: 6.9 10*3/uL (ref 4.0–10.5)
nRBC: 0 % (ref 0.0–0.2)

## 2021-12-09 LAB — BASIC METABOLIC PANEL
Anion gap: 10 (ref 5–15)
BUN: 20 mg/dL (ref 8–23)
CO2: 24 mmol/L (ref 22–32)
Calcium: 9.5 mg/dL (ref 8.9–10.3)
Chloride: 105 mmol/L (ref 98–111)
Creatinine, Ser: 0.77 mg/dL (ref 0.44–1.00)
GFR, Estimated: >60 mL/min (ref >60)
Glucose, Bld: 109 mg/dL — ABNORMAL HIGH (ref 70–99)
Potassium: 4.2 mmol/L (ref 3.5–5.1)
Sodium: 139 mmol/L (ref 135–145)

## 2021-12-09 LAB — TYPE AND SCREEN
ABO/RH(D): A POS
Antibody Screen: NEGATIVE

## 2021-12-09 LAB — PROTIME-INR
INR: 0.9 (ref 0.8–1.2)
Prothrombin Time: 12.4 seconds (ref 11.4–15.2)

## 2021-12-09 LAB — SURGICAL PCR SCREEN
MRSA, PCR: NEGATIVE
Staphylococcus aureus: NEGATIVE

## 2021-12-09 NOTE — Patient Instructions (Addendum)
Your procedure is scheduled on: Tuesday, January 24 Report to the Registration Desk on the 1st floor of the [facility]. To find out your arrival time, please call [phone] between 1PM - 3PM on: Monday, January 23  REMEMBER: Instructions that are not followed completely may result in serious medical risk, up to and including death; or upon the discretion of your surgeon and anesthesiologist your surgery may need to be rescheduled.  Do not eat food after midnight the night before surgery.  No gum chewing, lozengers or hard candies.  You may however, drink CLEAR liquids up to 2 hours before you are scheduled to arrive for your surgery. Do not drink anything within 2 hours of your scheduled arrival time.  Clear liquids include: - water  - apple juice without pulp - gatorade (not RED, PURPLE, OR BLUE) - black coffee or tea (Do NOT add milk or creamers to the coffee or tea) Do NOT drink anything that is not on this list.  TAKE THESE MEDICATIONS THE MORNING OF SURGERY WITH A SIP OF WATER:  Amlodipine  One week prior to surgery: starting January 17 Stop Anti-inflammatories (NSAIDS) such as Advil, Aleve, Ibuprofen, Motrin, Naproxen, Naprosyn and Aspirin based products such as Excedrin, Goodys Powder, BC Powder. Stop ANY OVER THE COUNTER supplements until after surgery. You may however, continue to take Tylenol if needed for pain up until the day of surgery.  No Alcohol for 24 hours before or after surgery.  No Smoking including e-cigarettes for 24 hours prior to surgery.   On the morning of surgery brush your teeth with toothpaste and water, you may rinse your mouth with mouthwash if you wish. Do not swallow any toothpaste or mouthwash.  Use CHG Soap as directed on instruction sheet.  Do not wear jewelry, make-up, hairpins, clips or nail polish.  Do not wear lotions, powders, or perfumes.   Do not shave body from the neck down 48 hours prior to surgery just in case you cut  yourself which could leave a site for infection.  Also, freshly shaved skin may become irritated if using the CHG soap.  Contact lenses, hearing aids and dentures may not be worn into surgery.  Do not bring valuables to the hospital. [facility] is not responsible for any missing/lost belongings or valuables.   Notify your doctor if there is any change in your medical condition (cold, fever, infection).  Wear comfortable clothing (specific to your surgery type) to the hospital.  After surgery, you can help prevent lung complications by doing breathing exercises.  Take deep breaths and cough every 1-2 hours. Your doctor may order a device called an Incentive Spirometer to help you take deep breaths.  If you are being admitted to the hospital overnight, leave your suitcase in the car. After surgery it may be brought to your room.  If you are being discharged the day of surgery, you will not be allowed to drive home. You will need a responsible adult (18 years or older) to drive you home and stay with you that night.   If you are taking public transportation, you will need to have a responsible adult (18 years or older) with you. Please confirm with your physician that it is acceptable to use public transportation.   Please call the [place] [facility] at [phone] if you have any questions about these instructions.  Surgery Visitation Policy:  Patients undergoing a surgery or procedure may have one family member or support person with  them as long as that person is not COVID-19 positive or experiencing its symptoms.  That person may remain in the waiting area during the procedure and may rotate out with other people.  Inpatient Visitation:    Visiting hours are 7 a.m. to 8 p.m. Up to two visitors ages 16+ are allowed at one time in a patient room. The visitors may rotate out with other people during the day. Visitors must check out when they leave, or other visitors will  not be allowed. One designated support person may remain overnight. The visitor must pass COVID-19 screenings, use hand sanitizer when entering and exiting the patients room and wear a mask at all times, including in the patients room. Patients must also wear a mask when staff or their visitor are in the room. Masking is required regardless of vaccination status.

## 2021-12-14 NOTE — Progress Notes (Signed)
Pre-op testing

## 2021-12-16 ENCOUNTER — Other Ambulatory Visit
Admission: RE | Admit: 2021-12-16 | Discharge: 2021-12-16 | Disposition: A | Discharge status: Home / Self Care | Payer: Medicare PPO | Source: Ambulatory Visit | Attending: Orthopedic Surgery | Admitting: Orthopedic Surgery

## 2021-12-16 ENCOUNTER — Other Ambulatory Visit: Payer: Self-pay

## 2021-12-16 DIAGNOSIS — Z20822 Contact with and (suspected) exposure to covid-19: Secondary | ICD-10-CM | POA: Diagnosis present

## 2021-12-16 LAB — SARS CORONAVIRUS 2 (TAT 6-24 HRS): SARS Coronavirus 2: NEGATIVE

## 2021-12-20 ENCOUNTER — Other Ambulatory Visit: Payer: Self-pay

## 2021-12-20 ENCOUNTER — Ambulatory Visit: Payer: Medicare Other

## 2021-12-20 ENCOUNTER — Encounter
Admission: RE | Disposition: A | Discharge status: Skilled Nursing Facility | Payer: Self-pay | Source: Home / Self Care | Attending: Orthopedic Surgery

## 2021-12-20 ENCOUNTER — Ambulatory Visit: Payer: Medicare Other | Admitting: Anesthesiology

## 2021-12-20 ENCOUNTER — Inpatient Hospital Stay
Admission: RE | Admit: 2021-12-20 | Discharge: 2021-12-23 | DRG: 470 | Disposition: A | Discharge status: Skilled Nursing Facility | Payer: Medicare Other | Attending: Orthopedic Surgery | Admitting: Orthopedic Surgery

## 2021-12-20 ENCOUNTER — Encounter: Payer: Self-pay | Admitting: Orthopedic Surgery

## 2021-12-20 DIAGNOSIS — Z87891 Personal history of nicotine dependence: Secondary | ICD-10-CM | POA: Diagnosis not present

## 2021-12-20 DIAGNOSIS — G4700 Insomnia, unspecified: Secondary | ICD-10-CM | POA: Diagnosis present

## 2021-12-20 DIAGNOSIS — Z9841 Cataract extraction status, right eye: Secondary | ICD-10-CM

## 2021-12-20 DIAGNOSIS — Z79899 Other long term (current) drug therapy: Secondary | ICD-10-CM

## 2021-12-20 DIAGNOSIS — Z961 Presence of intraocular lens: Secondary | ICD-10-CM | POA: Diagnosis present

## 2021-12-20 DIAGNOSIS — Z801 Family history of malignant neoplasm of trachea, bronchus and lung: Secondary | ICD-10-CM

## 2021-12-20 DIAGNOSIS — E785 Hyperlipidemia, unspecified: Secondary | ICD-10-CM | POA: Diagnosis not present

## 2021-12-20 DIAGNOSIS — Z85820 Personal history of malignant melanoma of skin: Secondary | ICD-10-CM | POA: Diagnosis not present

## 2021-12-20 DIAGNOSIS — Z888 Allergy status to other drugs, medicaments and biological substances status: Secondary | ICD-10-CM | POA: Diagnosis not present

## 2021-12-20 DIAGNOSIS — Z96652 Presence of left artificial knee joint: Secondary | ICD-10-CM

## 2021-12-20 DIAGNOSIS — Z820 Family history of epilepsy and other diseases of the nervous system: Secondary | ICD-10-CM | POA: Diagnosis not present

## 2021-12-20 DIAGNOSIS — Z9842 Cataract extraction status, left eye: Secondary | ICD-10-CM

## 2021-12-20 DIAGNOSIS — Z20822 Contact with and (suspected) exposure to covid-19: Secondary | ICD-10-CM | POA: Diagnosis not present

## 2021-12-20 DIAGNOSIS — M1712 Unilateral primary osteoarthritis, left knee: Principal | ICD-10-CM | POA: Diagnosis present

## 2021-12-20 DIAGNOSIS — I10 Essential (primary) hypertension: Secondary | ICD-10-CM | POA: Diagnosis present

## 2021-12-20 DIAGNOSIS — Z8249 Family history of ischemic heart disease and other diseases of the circulatory system: Secondary | ICD-10-CM

## 2021-12-20 HISTORY — PX: TOTAL KNEE ARTHROPLASTY: SHX125

## 2021-12-20 LAB — CBC
HCT: 39.7 % (ref 36.0–46.0)
Hemoglobin: 13.3 g/dL (ref 12.0–15.0)
MCH: 30.1 pg (ref 26.0–34.0)
MCHC: 33.5 g/dL (ref 30.0–36.0)
MCV: 89.8 fL (ref 80.0–100.0)
Platelets: 242 10*3/uL (ref 150–400)
RBC: 4.42 MIL/uL (ref 3.87–5.11)
RDW: 14.7 % (ref 11.5–15.5)
WBC: 14.2 10*3/uL — ABNORMAL HIGH (ref 4.0–10.5)
nRBC: 0 % (ref 0.0–0.2)

## 2021-12-20 LAB — CREATININE, SERUM
Creatinine, Ser: 0.64 mg/dL (ref 0.44–1.00)
GFR, Estimated: >60 mL/min (ref >60)

## 2021-12-20 SURGERY — ARTHROPLASTY, KNEE, TOTAL
Anesthesia: Spinal | Site: Knee | Laterality: Left

## 2021-12-20 MED ORDER — MORPHINE SULFATE (PF) 4 MG/ML IV SOLN
INTRAVENOUS | Status: AC
  Filled 2021-12-20: qty 1

## 2021-12-20 MED ORDER — TRANEXAMIC ACID 1000 MG/10ML IV SOLN
INTRAVENOUS | Status: AC
  Filled 2021-12-20: qty 10

## 2021-12-20 MED ORDER — ONDANSETRON HCL 4 MG/2ML IJ SOLN
INTRAMUSCULAR | Status: DC | PRN
  Administered 2021-12-20: 4 mg via INTRAVENOUS

## 2021-12-20 MED ORDER — HYDROCODONE-ACETAMINOPHEN 5-325 MG PO TABS: 1.0000 | ORAL_TABLET | ORAL | Status: DC | PRN

## 2021-12-20 MED ORDER — SENNOSIDES-DOCUSATE SODIUM 8.6-50 MG PO TABS: 1.0000 | ORAL_TABLET | Freq: Every evening | ORAL | Status: DC | PRN

## 2021-12-20 MED ORDER — MESALAMINE 1.2 G PO TBEC
1.2000 g | DELAYED_RELEASE_TABLET | Freq: Two times a day (BID) | ORAL | Status: DC
  Administered 2021-12-20 – 2021-12-23 (×5): 1.2 g via ORAL
  Filled 2021-12-20 (×9): qty 1

## 2021-12-20 MED ORDER — CHLORHEXIDINE GLUCONATE 0.12 % MT SOLN
OROMUCOSAL | Status: AC
  Administered 2021-12-20: 07:00:00 15 mL via OROMUCOSAL
  Filled 2021-12-20: qty 15

## 2021-12-20 MED ORDER — HYDROMORPHONE HCL 1 MG/ML IJ SOLN
INTRAMUSCULAR | Status: AC
  Administered 2021-12-20: 14:00:00 0.5 mg via INTRAVENOUS
  Filled 2021-12-20: qty 1

## 2021-12-20 MED ORDER — ORAL CARE MOUTH RINSE: 15.0000 mL | Freq: Once | OROMUCOSAL | Status: AC

## 2021-12-20 MED ORDER — BUPIVACAINE HCL (PF) 0.5 % IJ SOLN
INTRAMUSCULAR | Status: DC | PRN
  Administered 2021-12-20: 3 mL

## 2021-12-20 MED ORDER — GLYCOPYRROLATE 0.2 MG/ML IJ SOLN
INTRAMUSCULAR | Status: DC | PRN
  Administered 2021-12-20: .2 mg via INTRAVENOUS

## 2021-12-20 MED ORDER — DEXAMETHASONE SODIUM PHOSPHATE 10 MG/ML IJ SOLN
INTRAMUSCULAR | Status: DC | PRN
  Administered 2021-12-20: 10 mg via INTRAVENOUS

## 2021-12-20 MED ORDER — PROPOFOL 1000 MG/100ML IV EMUL
INTRAVENOUS | Status: AC
  Filled 2021-12-20: qty 100

## 2021-12-20 MED ORDER — FAMOTIDINE 20 MG PO TABS
ORAL_TABLET | ORAL | Status: AC
  Administered 2021-12-20: 07:00:00 20 mg via ORAL
  Filled 2021-12-20: qty 1

## 2021-12-20 MED ORDER — OXYCODONE HCL 5 MG PO TABS
5.0000 mg | ORAL_TABLET | Freq: Once | ORAL | Status: AC | PRN
  Administered 2021-12-20: 12:00:00 5 mg via ORAL

## 2021-12-20 MED ORDER — CHLORHEXIDINE GLUCONATE CLOTH 2 % EX PADS: 6.0000 | MEDICATED_PAD | Freq: Once | CUTANEOUS | Status: DC

## 2021-12-20 MED ORDER — SODIUM CHLORIDE 0.9 % IR SOLN
Status: DC | PRN
  Administered 2021-12-20: 08:00:00 500 mL

## 2021-12-20 MED ORDER — CEFAZOLIN SODIUM-DEXTROSE 2-4 GM/100ML-% IV SOLN
2.0000 g | INTRAVENOUS | Status: AC
  Administered 2021-12-20: 08:00:00 2 g via INTRAVENOUS

## 2021-12-20 MED ORDER — SODIUM CHLORIDE 0.9 % IR SOLN
Status: DC | PRN
  Administered 2021-12-20: 08:00:00 3012 mL

## 2021-12-20 MED ORDER — METHOCARBAMOL 500 MG PO TABS
ORAL_TABLET | ORAL | Status: AC
  Filled 2021-12-20: qty 1

## 2021-12-20 MED ORDER — PHENYLEPHRINE 40 MCG/ML (10ML) SYRINGE FOR IV PUSH (FOR BLOOD PRESSURE SUPPORT)
PREFILLED_SYRINGE | INTRAVENOUS | Status: DC | PRN
  Administered 2021-12-20: 80 ug via INTRAVENOUS

## 2021-12-20 MED ORDER — TRANEXAMIC ACID-NACL 1000-0.7 MG/100ML-% IV SOLN
INTRAVENOUS | Status: DC | PRN
Start: 2021-12-20 — End: 2021-12-20
  Administered 2021-12-20: 1000 mg via INTRAVENOUS

## 2021-12-20 MED ORDER — BUPIVACAINE LIPOSOME 1.3 % IJ SUSP
INTRAMUSCULAR | Status: AC
  Filled 2021-12-20: qty 20

## 2021-12-20 MED ORDER — SODIUM CHLORIDE 0.9 % IV SOLN: INTRAVENOUS | Status: DC

## 2021-12-20 MED ORDER — CLINDAMYCIN PHOSPHATE 600 MG/50ML IV SOLN
600.0000 mg | Freq: Once | INTRAVENOUS | Status: AC
  Administered 2021-12-20: 08:00:00 600 mg via INTRAVENOUS

## 2021-12-20 MED ORDER — ROCURONIUM BROMIDE 100 MG/10ML IV SOLN
INTRAVENOUS | Status: DC | PRN
  Administered 2021-12-20: 40 mg via INTRAVENOUS

## 2021-12-20 MED ORDER — CEFAZOLIN SODIUM-DEXTROSE 2-4 GM/100ML-% IV SOLN
2.0000 g | Freq: Four times a day (QID) | INTRAVENOUS | Status: AC
  Administered 2021-12-20 (×2): 2 g via INTRAVENOUS
  Filled 2021-12-20 (×4): qty 100

## 2021-12-20 MED ORDER — FENTANYL CITRATE (PF) 100 MCG/2ML IJ SOLN
INTRAMUSCULAR | Status: AC
  Filled 2021-12-20: qty 2

## 2021-12-20 MED ORDER — SODIUM CHLORIDE (PF) 0.9 % IJ SOLN
INTRAMUSCULAR | Status: DC | PRN
  Administered 2021-12-20: 10:00:00 60 mL

## 2021-12-20 MED ORDER — ONDANSETRON HCL 4 MG PO TABS: 4.0000 mg | ORAL_TABLET | Freq: Four times a day (QID) | ORAL | Status: DC | PRN

## 2021-12-20 MED ORDER — PHENYLEPHRINE HCL-NACL 20-0.9 MG/250ML-% IV SOLN
INTRAVENOUS | Status: DC | PRN
  Administered 2021-12-20: 15 ug/min via INTRAVENOUS

## 2021-12-20 MED ORDER — PROPOFOL 10 MG/ML IV BOLUS
INTRAVENOUS | Status: DC | PRN
  Administered 2021-12-20: 30 mg via INTRAVENOUS
  Administered 2021-12-20: 20 mg via INTRAVENOUS
  Administered 2021-12-20 (×2): 50 mg via INTRAVENOUS

## 2021-12-20 MED ORDER — MORPHINE SULFATE (PF) 4 MG/ML IV SOLN
0.5000 mg | INTRAVENOUS | Status: DC | PRN
  Administered 2021-12-20: 13:00:00 1 mg via INTRAVENOUS

## 2021-12-20 MED ORDER — SUGAMMADEX SODIUM 200 MG/2ML IV SOLN
INTRAVENOUS | Status: DC | PRN
  Administered 2021-12-20: 153.4 mg via INTRAVENOUS

## 2021-12-20 MED ORDER — ACETAMINOPHEN 10 MG/ML IV SOLN
INTRAVENOUS | Status: AC
  Filled 2021-12-20: qty 100

## 2021-12-20 MED ORDER — ROSUVASTATIN CALCIUM 10 MG PO TABS
10.0000 mg | ORAL_TABLET | Freq: Every evening | ORAL | Status: DC
  Administered 2021-12-20 – 2021-12-22 (×3): 10 mg via ORAL
  Filled 2021-12-20 (×3): qty 1

## 2021-12-20 MED ORDER — MENTHOL 3 MG MT LOZG
1.0000 | LOZENGE | OROMUCOSAL | Status: DC | PRN
  Filled 2021-12-20: qty 9

## 2021-12-20 MED ORDER — ONDANSETRON HCL 4 MG/2ML IJ SOLN
4.0000 mg | Freq: Four times a day (QID) | INTRAMUSCULAR | Status: DC | PRN
  Filled 2021-12-20: qty 2

## 2021-12-20 MED ORDER — METHOCARBAMOL 500 MG PO TABS
500.0000 mg | ORAL_TABLET | Freq: Four times a day (QID) | ORAL | Status: DC | PRN
  Administered 2021-12-20 – 2021-12-22 (×3): 500 mg via ORAL
  Filled 2021-12-20 (×2): qty 1

## 2021-12-20 MED ORDER — FLEET ENEMA 7-19 GM/118ML RE ENEM: 1.0000 | ENEMA | Freq: Once | RECTAL | Status: DC | PRN

## 2021-12-20 MED ORDER — BUPIVACAINE-EPINEPHRINE (PF) 0.25% -1:200000 IJ SOLN
INTRAMUSCULAR | Status: AC
  Filled 2021-12-20: qty 30

## 2021-12-20 MED ORDER — ENOXAPARIN SODIUM 40 MG/0.4ML IJ SOSY
40.0000 mg | PREFILLED_SYRINGE | INTRAMUSCULAR | Status: DC
  Administered 2021-12-21 – 2021-12-23 (×3): 40 mg via SUBCUTANEOUS
  Filled 2021-12-20 (×3): qty 0.4

## 2021-12-20 MED ORDER — METHOCARBAMOL 1000 MG/10ML IJ SOLN
500.0000 mg | Freq: Four times a day (QID) | INTRAVENOUS | Status: DC | PRN
  Filled 2021-12-20 (×2): qty 5

## 2021-12-20 MED ORDER — ACETAMINOPHEN 325 MG PO TABS
325.0000 mg | ORAL_TABLET | Freq: Four times a day (QID) | ORAL | Status: DC | PRN
  Administered 2021-12-21: 19:00:00 650 mg via ORAL
  Filled 2021-12-20: qty 2

## 2021-12-20 MED ORDER — FLUTICASONE PROPIONATE 50 MCG/ACT NA SUSP
1.0000 | Freq: Every evening | NASAL | Status: DC
  Administered 2021-12-22: 1 via NASAL
  Filled 2021-12-20: qty 16

## 2021-12-20 MED ORDER — HYDROMORPHONE HCL 1 MG/ML IJ SOLN
0.5000 mg | INTRAMUSCULAR | Status: DC | PRN
  Administered 2021-12-21: 17:00:00 1 mg via INTRAVENOUS
  Filled 2021-12-20: qty 1

## 2021-12-20 MED ORDER — TRAMADOL HCL 50 MG PO TABS
50.0000 mg | ORAL_TABLET | Freq: Four times a day (QID) | ORAL | Status: DC
  Administered 2021-12-20 – 2021-12-22 (×9): 50 mg via ORAL
  Filled 2021-12-20 (×8): qty 1

## 2021-12-20 MED ORDER — DOCUSATE SODIUM 100 MG PO CAPS
100.0000 mg | ORAL_CAPSULE | Freq: Two times a day (BID) | ORAL | Status: DC
  Administered 2021-12-20 – 2021-12-23 (×7): 100 mg via ORAL
  Filled 2021-12-20 (×7): qty 1

## 2021-12-20 MED ORDER — METRONIDAZOLE 0.75 % EX GEL
1.0000 "application " | Freq: Every day | CUTANEOUS | Status: DC
  Administered 2021-12-21: 1 via TOPICAL
  Filled 2021-12-20: qty 45

## 2021-12-20 MED ORDER — FAMOTIDINE 20 MG PO TABS: 20.0000 mg | ORAL_TABLET | Freq: Once | ORAL | Status: AC

## 2021-12-20 MED ORDER — TRANEXAMIC ACID-NACL 1000-0.7 MG/100ML-% IV SOLN: 1000.0000 mg | INTRAVENOUS | Status: DC

## 2021-12-20 MED ORDER — FENTANYL CITRATE (PF) 100 MCG/2ML IJ SOLN
INTRAMUSCULAR | Status: DC | PRN
  Administered 2021-12-20 (×3): 50 ug via INTRAVENOUS

## 2021-12-20 MED ORDER — DIPHENHYDRAMINE HCL 12.5 MG/5ML PO ELIX: 12.5000 mg | ORAL_SOLUTION | ORAL | Status: DC | PRN

## 2021-12-20 MED ORDER — AZELASTINE HCL 0.1 % NA SOLN
2.0000 | Freq: Every day | NASAL | Status: DC
  Administered 2021-12-21 – 2021-12-22 (×2): 2 via NASAL
  Filled 2021-12-20: qty 30

## 2021-12-20 MED ORDER — SODIUM CHLORIDE FLUSH 0.9 % IV SOLN
INTRAVENOUS | Status: AC
  Filled 2021-12-20: qty 40

## 2021-12-20 MED ORDER — CHLORHEXIDINE GLUCONATE CLOTH 2 % EX PADS
6.0000 | MEDICATED_PAD | Freq: Once | CUTANEOUS | Status: AC
  Administered 2021-12-20: 07:00:00 6 via TOPICAL

## 2021-12-20 MED ORDER — CLINDAMYCIN PHOSPHATE 600 MG/50ML IV SOLN
INTRAVENOUS | Status: AC
  Filled 2021-12-20: qty 50

## 2021-12-20 MED ORDER — CHLORHEXIDINE GLUCONATE 0.12 % MT SOLN: 15.0000 mL | Freq: Once | OROMUCOSAL | Status: AC

## 2021-12-20 MED ORDER — PROPOFOL 500 MG/50ML IV EMUL
INTRAVENOUS | Status: DC | PRN
Start: 2021-12-20 — End: 2021-12-20
  Administered 2021-12-20: 100 ug/kg/min via INTRAVENOUS

## 2021-12-20 MED ORDER — AMLODIPINE BESYLATE 5 MG PO TABS
5.0000 mg | ORAL_TABLET | Freq: Every day | ORAL | Status: DC
  Administered 2021-12-21 – 2021-12-22 (×2): 5 mg via ORAL
  Filled 2021-12-20 (×3): qty 1

## 2021-12-20 MED ORDER — NEOMYCIN-POLYMYXIN B GU 40-200000 IR SOLN
Status: AC
  Filled 2021-12-20: qty 20

## 2021-12-20 MED ORDER — TRAMADOL HCL 50 MG PO TABS
ORAL_TABLET | ORAL | Status: AC
  Filled 2021-12-20: qty 1

## 2021-12-20 MED ORDER — CEFAZOLIN SODIUM-DEXTROSE 2-4 GM/100ML-% IV SOLN
INTRAVENOUS | Status: AC
  Filled 2021-12-20: qty 100

## 2021-12-20 MED ORDER — TRAZODONE HCL 100 MG PO TABS
100.0000 mg | ORAL_TABLET | Freq: Every day | ORAL | Status: DC
Start: 2021-12-20 — End: 2021-12-23
  Administered 2021-12-20 – 2021-12-22 (×3): 100 mg via ORAL
  Filled 2021-12-20 (×3): qty 1

## 2021-12-20 MED ORDER — OXYCODONE HCL 5 MG PO TABS
ORAL_TABLET | ORAL | Status: AC
  Filled 2021-12-20: qty 1

## 2021-12-20 MED ORDER — ACETAMINOPHEN 500 MG PO TABS
500.0000 mg | ORAL_TABLET | Freq: Four times a day (QID) | ORAL | Status: AC
  Administered 2021-12-20 – 2021-12-21 (×4): 500 mg via ORAL
  Filled 2021-12-20 (×4): qty 1

## 2021-12-20 MED ORDER — HYDROCODONE-ACETAMINOPHEN 7.5-325 MG PO TABS
1.0000 | ORAL_TABLET | ORAL | Status: DC | PRN
  Administered 2021-12-20: 16:00:00 2 via ORAL
  Filled 2021-12-20: qty 2

## 2021-12-20 MED ORDER — MORPHINE SULFATE 4 MG/ML IJ SOLN
INTRAMUSCULAR | Status: DC | PRN
  Administered 2021-12-20: 10:00:00 62 mL

## 2021-12-20 MED ORDER — PHENOL 1.4 % MT LIQD
1.0000 | OROMUCOSAL | Status: DC | PRN
  Filled 2021-12-20: qty 177

## 2021-12-20 MED ORDER — LACTATED RINGERS IV SOLN: INTRAVENOUS | Status: DC

## 2021-12-20 MED ORDER — TRANEXAMIC ACID-NACL 1000-0.7 MG/100ML-% IV SOLN
INTRAVENOUS | Status: AC
  Filled 2021-12-20: qty 100

## 2021-12-20 MED ORDER — GELATIN 600 MG PO CAPS: 600.0000 mg | ORAL_CAPSULE | Freq: Two times a day (BID) | ORAL | Status: DC

## 2021-12-20 MED ORDER — OXYCODONE HCL 5 MG/5ML PO SOLN: 5.0000 mg | Freq: Once | ORAL | Status: AC | PRN

## 2021-12-20 MED ORDER — OXYCODONE HCL 5 MG PO TABS
5.0000 mg | ORAL_TABLET | ORAL | Status: DC | PRN
  Administered 2021-12-20: 21:00:00 5 mg via ORAL
  Administered 2021-12-21: 10 mg via ORAL
  Administered 2021-12-21: 16:00:00 7.5 mg via ORAL
  Administered 2021-12-21 – 2021-12-22 (×4): 5 mg via ORAL
  Administered 2021-12-22 – 2021-12-23 (×7): 10 mg via ORAL
  Filled 2021-12-20: qty 1
  Filled 2021-12-20: qty 2
  Filled 2021-12-20 (×2): qty 1
  Filled 2021-12-20 (×2): qty 2
  Filled 2021-12-20 (×2): qty 1
  Filled 2021-12-20 (×4): qty 2
  Filled 2021-12-20: qty 1
  Filled 2021-12-20: qty 2
  Filled 2021-12-20: qty 1

## 2021-12-20 MED ORDER — HYDROMORPHONE HCL 1 MG/ML IJ SOLN
0.5000 mg | INTRAMUSCULAR | Status: AC | PRN
  Administered 2021-12-20: 14:00:00 0.5 mg via INTRAVENOUS

## 2021-12-20 MED ORDER — BISACODYL 5 MG PO TBEC
5.0000 mg | DELAYED_RELEASE_TABLET | Freq: Every day | ORAL | Status: DC | PRN
  Administered 2021-12-22: 5 mg via ORAL
  Filled 2021-12-20: qty 1

## 2021-12-20 MED ORDER — ACETAMINOPHEN 10 MG/ML IV SOLN
INTRAVENOUS | Status: DC | PRN
  Administered 2021-12-20: 1000 mg via INTRAVENOUS

## 2021-12-20 MED ORDER — FENTANYL CITRATE (PF) 100 MCG/2ML IJ SOLN
25.0000 ug | INTRAMUSCULAR | Status: DC | PRN
  Administered 2021-12-20: 12:00:00 50 ug via INTRAVENOUS
  Administered 2021-12-20 (×2): 25 ug via INTRAVENOUS

## 2021-12-20 SURGICAL SUPPLY — 70 items
BLADE SAW 90X13X1.19 OSCILLAT (BLADE) ×2 IMPLANT
BLADE SAW 90X25X1.19 OSCILLAT (BLADE) ×2 IMPLANT
CEMENT HV SMART SET (Cement) ×4 IMPLANT
CEMENT TIBIA MBT (Knees) IMPLANT
CNTNR SPEC 2.5X3XGRAD LEK (MISCELLANEOUS) ×1
CONT SPEC 4OZ STER OR WHT (MISCELLANEOUS) ×1
CONTAINER SPEC 2.5X3XGRAD LEK (MISCELLANEOUS) ×1 IMPLANT
COOLER POLAR GLACIER W/PUMP (MISCELLANEOUS) ×2 IMPLANT
CUFF TOURN SGL QUICK 24 (TOURNIQUET CUFF)
CUFF TOURN SGL QUICK 34 (TOURNIQUET CUFF)
CUFF TRNQT CYL 24X4X16.5-23 (TOURNIQUET CUFF) IMPLANT
CUFF TRNQT CYL 34X4.125X (TOURNIQUET CUFF) IMPLANT
DRAPE 3/4 80X56 (DRAPES) ×4 IMPLANT
DRAPE IMP U-DRAPE 54X76 (DRAPES) ×4 IMPLANT
DRAPE INCISE IOBAN 66X60 STRL (DRAPES) ×2 IMPLANT
DRAPE SURG 17X11 SM STRL (DRAPES) ×4 IMPLANT
DRSG AQUACEL AG ADV 3.5X10 (GAUZE/BANDAGES/DRESSINGS) ×2 IMPLANT
DRSG MEPILEX SACRM 8.7X9.8 (GAUZE/BANDAGES/DRESSINGS) ×2 IMPLANT
DURAPREP 26ML APPLICATOR (WOUND CARE) ×7 IMPLANT
ELECT REM PT RETURN 9FT ADLT (ELECTROSURGICAL) ×2
ELECTRODE REM PT RTRN 9FT ADLT (ELECTROSURGICAL) ×1 IMPLANT
FEMUR SIGMA PS KNEE SZ 4.0N L (Femur) ×1 IMPLANT
GAUZE SPONGE 4X4 12PLY STRL (GAUZE/BANDAGES/DRESSINGS) ×2 IMPLANT
GLOVE SURG ORTHO LTX SZ9 (GLOVE) ×4 IMPLANT
GLOVE SURG SYN 7.5  E (GLOVE) ×1
GLOVE SURG SYN 7.5 E (GLOVE) ×1 IMPLANT
GLOVE SURG SYN 7.5 PF PI (GLOVE) ×1 IMPLANT
GLOVE SURG UNDER POLY LF SZ7.5 (GLOVE) ×2 IMPLANT
GLOVE SURG UNDER POLY LF SZ9 (GLOVE) ×2 IMPLANT
GOWN STRL REUS TWL 2XL XL LVL4 (GOWN DISPOSABLE) ×2 IMPLANT
GOWN STRL REUS W/ TWL LRG LVL3 (GOWN DISPOSABLE) ×1 IMPLANT
GOWN STRL REUS W/ TWL LRG LVL4 (GOWN DISPOSABLE) ×1 IMPLANT
GOWN STRL REUS W/ TWL XL LVL3 (GOWN DISPOSABLE) ×1 IMPLANT
GOWN STRL REUS W/TWL LRG LVL3 (GOWN DISPOSABLE) ×1
GOWN STRL REUS W/TWL LRG LVL4 (GOWN DISPOSABLE) ×1
GOWN STRL REUS W/TWL XL LVL3 (GOWN DISPOSABLE) ×1
HOLDER FOLEY CATH W/STRAP (MISCELLANEOUS) ×2 IMPLANT
IMMBOLIZER KNEE 19 BLUE UNIV (SOFTGOODS) ×2 IMPLANT
IV NS IRRIG 3000ML ARTHROMATIC (IV SOLUTION) ×2 IMPLANT
KIT TURNOVER KIT A (KITS) ×2 IMPLANT
MANIFOLD NEPTUNE II (INSTRUMENTS) ×4 IMPLANT
NDL SAFETY ECLIPSE 18X1.5 (NEEDLE) ×1 IMPLANT
NDL SPNL 20GX3.5 QUINCKE YW (NEEDLE) ×1 IMPLANT
NEEDLE HYPO 18GX1.5 SHARP (NEEDLE) ×1
NEEDLE HYPO 22GX1.5 SAFETY (NEEDLE) ×2 IMPLANT
NEEDLE SPNL 20GX3.5 QUINCKE YW (NEEDLE) ×2 IMPLANT
NS IRRIG 1000ML POUR BTL (IV SOLUTION) ×2 IMPLANT
PACK TOTAL KNEE (MISCELLANEOUS) ×2 IMPLANT
PAD ABD DERMACEA PRESS 5X9 (GAUZE/BANDAGES/DRESSINGS) ×4 IMPLANT
PAD WRAPON POLAR KNEE (MISCELLANEOUS) ×1 IMPLANT
PATELLA DOME PFC 35MM (Knees) ×1 IMPLANT
PENCIL SMOKE EVACUATOR COATED (MISCELLANEOUS) ×2 IMPLANT
PIN DRILL FIX HALF THREAD (BIT) ×4 IMPLANT
PLATE ROT INSERT 10MM SIZE 4 (Plate) ×1 IMPLANT
PULSAVAC PLUS IRRIG FAN TIP (DISPOSABLE) ×2
STAPLER SKIN PROX 35W (STAPLE) ×2 IMPLANT
SUCTION FRAZIER HANDLE 10FR (MISCELLANEOUS) ×1
SUCTION TUBE FRAZIER 10FR DISP (MISCELLANEOUS) ×1 IMPLANT
SUT ETHIBOND NAB CT1 #1 30IN (SUTURE) ×4 IMPLANT
SUT VIC AB 0 CT1 36 (SUTURE) ×2 IMPLANT
SUT VIC AB 2-0 CT1 (SUTURE) ×4 IMPLANT
SYR 20ML LL LF (SYRINGE) ×2 IMPLANT
SYR 30ML LL (SYRINGE) ×4 IMPLANT
TIBIA MBT CEMENT (Knees) ×2 IMPLANT
TIP FAN IRRIG PULSAVAC PLUS (DISPOSABLE) ×1 IMPLANT
TOWER CARTRIDGE SMART MIX (DISPOSABLE) ×2 IMPLANT
TRAY FOLEY MTR SLVR 16FR STAT (SET/KITS/TRAYS/PACK) ×2 IMPLANT
TUBE SUCT KAM VAC (TUBING) ×2 IMPLANT
WATER STERILE IRR 500ML POUR (IV SOLUTION) ×2 IMPLANT
WRAPON POLAR PAD KNEE (MISCELLANEOUS) ×2

## 2021-12-20 NOTE — Anesthesia Procedure Notes (Signed)
Procedure Name: Intubation Date/Time: 12/20/2021 8:53 AM Performed by: Willette Alma, CRNA Pre-anesthesia Checklist: Patient identified, Patient being monitored, Timeout performed, Emergency Drugs available and Suction available Patient Re-evaluated:Patient Re-evaluated prior to induction Oxygen Delivery Method: Circle system utilized Preoxygenation: Pre-oxygenation with 100% oxygen Induction Type: IV induction Ventilation: Mask ventilation without difficulty Laryngoscope Size: 3 and McGraph Grade View: Grade I Tube type: Oral Tube size: 7.0 mm Number of attempts: 1 Airway Equipment and Method: Stylet Placement Confirmation: ETT inserted through vocal cords under direct vision, positive ETCO2 and breath sounds checked- equal and bilateral Secured at: 21 cm Tube secured with: Tape Dental Injury: Teeth and Oropharynx as per pre-operative assessment  Comments: Atraumatic DL and intubation. Lips and teeth remain unchanged from preoperative assessment.

## 2021-12-20 NOTE — Anesthesia Preprocedure Evaluation (Signed)
Anesthesia Evaluation  Patient identified by MRN, date of birth, ID band Patient awake    Reviewed: Allergy & Precautions, NPO status , Patient's Chart, lab work & pertinent test results  History of Anesthesia Complications Negative for: history of anesthetic complications  Airway Mallampati: III  TM Distance: <3 FB Neck ROM: full    Dental  (+) Chipped   Pulmonary neg shortness of breath, former smoker,    Pulmonary exam normal        Cardiovascular Exercise Tolerance: Good hypertension, (-) anginaNormal cardiovascular exam     Neuro/Psych negative neurological ROS  negative psych ROS   GI/Hepatic Neg liver ROS, PUD, GERD  Controlled,  Endo/Other  negative endocrine ROS  Renal/GU      Musculoskeletal  (+) Arthritis ,   Abdominal   Peds  Hematology negative hematology ROS (+)   Anesthesia Other Findings Past Medical History: No date: Arthritis     Comment:  knee and hip,  per [facility] New Patient Packet.  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  No date: Colitis 11/27/2018: H/O CT scan of chest     Comment:  By Berle Mull, per [facility] New Patient Packet.  11/27/2018: H/O mammogram     Comment:  By Arvilla Market at [facility], per [facility] New Patient Packet.  11/27/2018: History of CT scan of abdomen     Comment:  By Berle Mull, per [facility] New Patient Packet.  11/27/2018: History of upper extremity x-ray     Comment:  By Bridgette Habermann, of Shoulder, per psc New Patient               Packet.  11/27/2018: History of x-ray of hip     Comment:  By Bridgette Habermann, of Hip, per [facility] New Patient               Packet.  No date: Hyperlipemia     Comment:   per [facility] New Patient Packet.  No date: Hypertension     Comment:   per [facility] New Patient Packet.  No date: Impingement syndrome of shoulder     Comment:   per [facility] New Patient Packet.  No date:  Incontinence     Comment:  Mild Nighttime per [facility] New Patient Packet. No date: Insomnia No date: Melanoma ([place])     Comment:  right lower leg; right ovary No date: Osteoarthritis of multiple joints No date: Rosacea     Comment:   per [facility] New Patient Packet.  No date: Sleep disorder  Past Surgical History: 11/28/1987: ABDOMINAL HYSTERECTOMY     Comment:  Partial per [facility] New Patient Packet 11/28/2003: ARTHROSCOPIC REPAIR ACL; Left     Comment:  Left Knee/ Torn Medial Meniscus per pcs New Patient               Packet No date: BREAST BIOPSY No date: CATARACT EXTRACTION W/ INTRAOCULAR LENS IMPLANT; Bilateral 11/27/2016: COLONOSCOPY     Comment:  By Elspeth Cho,  per [facility] New Patient Packet.  11/27/2017: COLONOSCOPY     Comment:  By Elspeth Cho, per [facility] New Patient Packet.  11/28/1987: HYSTEROTOMY     Comment:  per [facility] New Patient Packet No date: KNEE SURGERY 11/27/1998: MELANOMA EXCISION     Comment:  Right Shin per [facility] New Patient Packet 11/27/2010: OOPHORECTOMY     Comment:  along with melanoma mass per [facility] New Patient Packet 11/27/1950: TONSILLECTOMY  Comment:  per [facility] New Patient Packet 1976: TUBAL LIGATION  BMI    Body Mass Index: 29.02 kg/m      Reproductive/Obstetrics negative OB ROS                             Anesthesia Physical Anesthesia Plan  ASA: 3  Anesthesia Plan: Spinal   Post-op Pain Management:    Induction:   PONV Risk Score and Plan:   Airway Management Planned: Natural Airway and Nasal Cannula  Additional Equipment:   Intra-op Plan:   Post-operative Plan:   Informed Consent: I have reviewed the patients History and Physical, chart, labs and discussed the procedure including the risks, benefits and alternatives for the proposed anesthesia with the patient or authorized representative who has indicated his/her understanding and acceptance.     Dental Advisory Given  Plan Discussed with: Anesthesiologist,  CRNA and Surgeon  Anesthesia Plan Comments: (Patient reports no bleeding problems and no anticoagulant use.  Plan for spinal with backup GA  Patient consented for risks of anesthesia including but not limited to:  - adverse reactions to medications - damage to eyes, teeth, lips or other oral mucosa - nerve damage due to positioning  - risk of bleeding, infection and or nerve damage from spinal that could lead to paralysis - risk of headache or failed spinal - damage to teeth, lips or other oral mucosa - sore throat or hoarseness - damage to heart, brain, nerves, lungs, other parts of body or loss of life  Patient voiced understanding.)        Anesthesia Quick Evaluation

## 2021-12-20 NOTE — Anesthesia Procedure Notes (Signed)
Spinal  Patient location during procedure: OR Start time: 12/20/2021 8:01 AM Reason for block: surgical anesthesia Staffing Performed: resident/CRNA  Resident/CRNA: Willette Alma, CRNA Preanesthetic Checklist Completed: patient identified, IV checked, site marked, risks and benefits discussed, surgical consent, monitors and equipment checked, pre-op evaluation and timeout performed Spinal Block Patient position: sitting Prep: Betadine Patient monitoring: heart rate, continuous pulse ox, blood pressure and cardiac monitor Approach: midline Location: L4-5 Injection technique: single-shot Needle Needle type: Whitacre and Introducer  Needle gauge: 24 G Needle length: 9 cm Assessment Sensory level: T8 Events: CSF return Additional Notes Negative paresthesia. Negative blood return. Positive free-flowing CSF. Expiration date of kit checked and confirmed. Patient tolerated procedure well, without complications.

## 2021-12-20 NOTE — H&P (Signed)
PREOPERATIVE H&P  Chief Complaint: Left Knee Osteoarthritis  HPI: Barbara Burgess is a 76 y.o. female who presents for preoperative history and physical with a diagnosis of Left Knee osteoarthritis. Left knee pain with ambulation is significantly impairing activities of daily living.  Her x-rays demonstrate tricompartmental osteoarthritis with joint space narrowing, subchondral sclerosis and osteophyte formation.  Patient is failed nonoperative management wished to proceed with a left total knee arthroplasty.  Past Medical History:  Diagnosis Date   Arthritis    knee and hip,  per [facility] New Patient Packet.    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    Colitis    H/O CT scan of chest 11/27/2018   By Berle Mull, per [facility]sc New Patient Packet.    H/O mammogram 11/27/2018   By Arvilla Market at [facility], per psc New Patient Packet.    History of CT scan of abdomen 11/27/2018   By Berle Mull, per [facility] New Patient Packet.    History of upper extremity x-ray 11/27/2018   By Bridgette Habermann, of Shoulder, per psc New Patient Packet.    History of x-ray of hip 11/27/2018   By Bridgette Habermann, of Hip, per psc New Patient Packet.    Hyperlipemia     per [facility] New Patient Packet.    Hypertension     per [facility] New Patient Packet.    Impingement syndrome of shoulder     per [facility] New Patient Packet.    Incontinence    Mild Nighttime per pcs New Patient Packet.   Insomnia    Melanoma ([place])    right lower leg; right ovary   Osteoarthritis of multiple joints    Rosacea     per [facility] New Patient Packet.    Sleep disorder    Past Surgical History:  Procedure Laterality Date   ABDOMINAL HYSTERECTOMY  11/28/1987   Partial per pcs New Patient Packet   ARTHROSCOPIC REPAIR ACL Left 11/28/2003   Left Knee/ Torn Medial Meniscus per pcs New Patient Packet   BREAST BIOPSY     CATARACT EXTRACTION W/ INTRAOCULAR LENS IMPLANT Bilateral    COLONOSCOPY   11/27/2016   By Elspeth Cho,  per [facility]sc New Patient Packet.    COLONOSCOPY  11/27/2017   By Elspeth Cho, per [facility] New Patient Packet.    HYSTEROTOMY  11/28/1987   per [facility] New Patient Packet   KNEE SURGERY     MELANOMA EXCISION  11/27/1998   Right Shin per pcs New Patient Packet   OOPHORECTOMY  11/27/2010   along with melanoma mass per [facility] New Patient Packet   TONSILLECTOMY  11/27/1950   per [facility]cs New Patient Packet   TUBAL LIGATION  1976   Social History   Socioeconomic History   Marital status: Widowed    Spouse name: Not on file   Number of children: 2   Years of education: Not on file   Highest education level: Not on file  Occupational History   Occupation: Scientist, physiological    Comment: Retired  Tobacco Use   Smoking status: Former    Packs/day: 1.00    Types: Cigarettes    Quit date: 2006    Years since quitting: 17.0   Smokeless tobacco: Never  Vaping Use   Vaping Use: Never used  Substance and Sexual Activity   Alcohol use: Yes    Comment: 2 a week   Drug use: Never   Sexual  activity: Not on file  Other Topics Concern   Not on file  Social History Narrative   Husband was Sherryll Burger minister---they spent time overseas teaching   Son in [place]   Daughter in [place]         Has living will   Son is health care POA----daughter is alternate   Would accept resuscitation attempts   Doesn't want tube feeds if cognitively unaware      Per [facility] New Patient Packet, abstracted 12/09/19      Diet: Patient states that she tries to have a diet rich in fruit, vegetables, and whole grains. But needs to modify diet because of colitis.       Caffeine: Yes      Married, if yes what year: Widowed/ Married in [place] you live in a house, apartment, assisted living, [place], trailer, ect: [facility] at [place], 1 person      Pets: 1 Dog   Highest Level of Education completed? B.A.      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       Exercise: Yes, daily walking          Living Will: Yes   DNR: Yes   POA/HPOA: Yes       Functional Status:   Do you have difficulty bathing or dressing yourself? No    Do you have difficulty preparing food or eating? No   Do you have difficulty managing your medications? No    Do you have difficulty managing your finances? No    Do you have difficulty affording your medications? No   Social Determinants of Radio broadcast assistant Strain: Not on file  Food Insecurity: Not on file  Transportation Needs: Not on file  Physical Activity: Not on file  Stress: Not on file  Social Connections: Not on file   Family History  Problem Relation Age of Onset   Alzheimer's disease Mother    Lung cancer Maternal Grandfather    Heart attack Paternal Grandfather    Diabetes Neg Hx    Allergies  Allergen Reactions   Atorvastatin     Elevated Liver Enzymes.    Prior to Admission medications   Medication Sig Start Date End Date Taking? Authorizing Provider  amLODipine (NORVASC) 5 MG tablet Take 1 tablet (5 mg total) by mouth daily. Patient taking differently: Take 5 mg by mouth daily. 01/27/20 12/20/21 Yes Lauree Chandler, NP  azelastine (ASTELIN) 0.1 % nasal spray Place 2 sprays into both nostrils daily. 09/20/21  Yes [provider]  CALCIUM CARBONATE-VITAMIN D PO Take 2 tablets by mouth daily.   Yes [provider]  fluticasone (FLONASE) 50 MCG/ACT nasal spray Place 1 spray into both nostrils every evening. 10/23/21  Yes [provider]  Gelatin 600 MG CAPS Take 600 mg by mouth 2 (two) times daily with a meal.   Yes [provider]  mesalamine (LIALDA) 1.2 g EC tablet TAKE 4 TABLETS (4.8 G TOTAL) DAILY. Patient taking differently: Take 1.2 g by mouth in the morning and at bedtime. 10/05/21  Yes Vanga, Tally Due, MD  metroNIDAZOLE (METROCREAM) 0.75 % cream Apply 1 application topically daily.     Yes [provider]  naproxen sodium (ALEVE) 220 MG tablet Take 220 mg by mouth daily as needed (knee pain).   Yes [provider]  rosuvastatin (CRESTOR) 10 MG tablet Take 1  tablet (10 mg total) by mouth daily. Patient taking differently: Take 10 mg by mouth every evening. 01/28/20  Yes Lauree Chandler, NP  traZODone (DESYREL) 100 MG tablet Take 1 tablet (100 mg total) by mouth at bedtime. 01/27/20  Yes Lauree Chandler, NP  Turmeric 500 MG CAPS Take 500 mg by mouth 2 (two) times daily.   Yes [provider]     Positive ROS: All other systems have been reviewed and were otherwise negative with the exception of those mentioned in the HPI and as above.  Physical Exam: General: Alert, no acute distress Cardiovascular: Regular rate and rhythm, no murmurs rubs or gallops.  No pedal edema Respiratory: Clear to auscultation bilaterally, no wheezes rales or rhonchi. No cyanosis, no use of accessory musculature GI: No organomegaly, abdomen is soft and non-tender nondistended with positive bowel sounds. Skin: Skin intact, no lesions within the operative field. Neurologic: Sensation intact distally Psychiatric: Patient is competent for consent with normal mood and affect Lymphatic: No cervical lymphadenopathy  MUSCULOSKELETAL: Left Knee: The patient has a valgus angulation to her left knee. There is a slight varus angulation to her right knee. Her skin is intact. There is no erythema, ecchymosis or significant effusion. Range of motion is from near full extension to approximately 115 degrees of flexion. She has no obvious ligamentous laxity. She has palpable pedal pulses, intact sensation to light touch and intact motor function. She has no calf tenderness or lower leg edema.   Radiology: I reviewed the x-ray films of the patient's left knee taken on 07/06/2021. The patient has a tricompartmental osteoarthritis, most notable in the lateral compartment where she is  bone-on-bone. She is bone-on-bone to the medial compartment in the right knee. 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 The patient states she has had a previous ACL tear in the left knee as well. No fracture or dislocation seen. The patient has moderate to advanced degenerative changes of the patellofemoral joint as well.   Assessment: Left Knee Fracture  Plan: Plan for Procedure(s): LEFT TOTAL KNEE ARTHROPLASTY  I reviewed the details of the operation as well as the postoperative course with the patient.  Preop history and physical was performed at the bedside.  I marked the left knee according to hospital's correct site of surgery protocol after confirming with the patient that this was the correct site of surgery.  I discussed the risks and benefits of surgery. The risks include but are not limited to infection requiring the removal of the prosthesis, bleeding requiring blood transfusion, nerve or blood vessel injury, joint stiffness or loss of motion, persistent pain, weakness or instability, fracture or dislocation, loosening or failure of the hardware and the need for further surgery. Medical risks include but are not limited to DVT and pulmonary embolism, myocardial infarction, stroke, pneumonia, respiratory failure and death. Patient understood these risks and wished to proceed.     Thornton Park, MD   12/20/2021 7:53 AM

## 2021-12-20 NOTE — Progress Notes (Signed)
°   12/20/21 0715  Clinical Encounter Type  Visited With Patient  Visit Type Pre-op;Spiritual support   Chaplain provided support through compassionate presence, conversation and prayer

## 2021-12-20 NOTE — Plan of Care (Signed)
°  Problem: Education: Goal: Knowledge of General Education information will improve Description: Including pain rating scale, medication(s)/side effects and non-pharmacologic comfort measures 12/20/2021 1147 by Jeson Camacho, Helane Gunther, RN Outcome: Progressing 12/20/2021 1147 by Deetta Perla, RN Outcome: Progressing   Problem: Health Behavior/Discharge Planning: Goal: Ability to manage health-related needs will improve 12/20/2021 1147 by Nava Song, Helane Gunther, RN Outcome: Progressing 12/20/2021 1147 by Deetta Perla, RN Outcome: Progressing   Problem: Clinical Measurements: Goal: Ability to maintain clinical measurements within normal limits will improve 12/20/2021 1147 by Elimelech Houseman, Helane Gunther, RN Outcome: Progressing 12/20/2021 1147 by Deetta Perla, RN Outcome: Progressing Goal: Will remain free from infection 12/20/2021 1147 by Deetta Perla, RN Outcome: Progressing 12/20/2021 1147 by Deetta Perla, RN Outcome: Progressing Goal: Diagnostic test results will improve 12/20/2021 1147 by Deetta Perla, RN Outcome: Progressing 12/20/2021 1147 by Deetta Perla, RN Outcome: Progressing Goal: Respiratory complications will improve 12/20/2021 1147 by Deetta Perla, RN Outcome: Progressing 12/20/2021 1147 by Deetta Perla, RN Outcome: Progressing Goal: Cardiovascular complication will be avoided 12/20/2021 1147 by Deetta Perla, RN Outcome: Progressing 12/20/2021 1147 by Deetta Perla, RN Outcome: Progressing   Problem: Activity: Goal: Risk for activity intolerance will decrease 12/20/2021 1147 by Jeiden Daughtridge, Helane Gunther, RN Outcome: Progressing 12/20/2021 1147 by Deetta Perla, RN Outcome: Progressing   Problem: Nutrition: Goal: Adequate nutrition will be maintained 12/20/2021 1147 by Deetta Perla, RN Outcome: Progressing 12/20/2021 1147 by Deetta Perla, RN Outcome: Progressing   Problem: Coping: Goal: Level of anxiety will decrease 12/20/2021  1147 by Deetta Perla, RN Outcome: Progressing 12/20/2021 1147 by Deetta Perla, RN Outcome: Progressing   Problem: Elimination: Goal: Will not experience complications related to bowel motility 12/20/2021 1147 by Deetta Perla, RN Outcome: Progressing 12/20/2021 1147 by Deetta Perla, RN Outcome: Progressing Goal: Will not experience complications related to urinary retention 12/20/2021 1147 by Deetta Perla, RN Outcome: Progressing 12/20/2021 1147 by Deetta Perla, RN Outcome: Progressing   Problem: Pain Managment: Goal: General experience of comfort will improve 12/20/2021 1147 by Deetta Perla, RN Outcome: Progressing 12/20/2021 1147 by Deetta Perla, RN Outcome: Progressing   Problem: Safety: Goal: Ability to remain free from injury will improve 12/20/2021 1147 by Ravenne Wayment, Helane Gunther, RN Outcome: Progressing 12/20/2021 1147 by Deetta Perla, RN Outcome: Progressing   Problem: Skin Integrity: Goal: Risk for impaired skin integrity will decrease 12/20/2021 1147 by Deetta Perla, RN Outcome: Progressing 12/20/2021 1147 by Deetta Perla, RN Outcome: Progressing   Problem: Education: Goal: Knowledge of the prescribed therapeutic regimen will improve Outcome: Progressing Goal: Individualized Educational Video(s) Outcome: Progressing   Problem: Activity: Goal: Ability to avoid complications of mobility impairment will improve Outcome: Progressing Goal: Range of joint motion will improve Outcome: Progressing   Problem: Clinical Measurements: Goal: Postoperative complications will be avoided or minimized Outcome: Progressing   Problem: Pain Management: Goal: Pain level will decrease with appropriate interventions Outcome: Progressing   Problem: Skin Integrity: Goal: Will show signs of wound healing Outcome: Progressing

## 2021-12-20 NOTE — Evaluation (Signed)
Physical Therapy Evaluation Patient Details Name: Barbara Burgess MRN: [id number] DOB: Aug 06, 1946 Today's Date: 12/20/2021  History of Present Illness  Pt is a 76 y.o. female s/p L TKA secondary to OA 12/20/21.  PMH includes htn, B bunions, chronic ulcerative proctitis, shoulder impingement, insomnia.  Pt reports h/o fainting.  Clinical Impression  Prior to surgery, pt was independent with ambulation; lives alone (at [facility]); is hoping to go to rehab at [facility] upon hospital discharge.  Currently pt is SBA semi-supine to sitting edge of bed; min assist with transfers using RW; and CGA to min assist to ambulate a few feet bed to recliner with RW use.  Pt initially teary sitting edge of bed (pt reporting being scared) but improved mood noted with support provided by therapist and pt's daughter.  Pt reported feeling faint after getting to recliner so pt's LE's elevated, pt reclined, and cold wash cloth placed on pt's forehead; symptoms resolved within a few minutes--nurse notified.  Pain L knee 5/10 at rest beginning of session and 4/10 at rest end of session (pt reporting pain issues earlier today but nursing gave pt pain medication prior to session).  Able to perform L LE SLR independently; L knee AROM 8-90 degrees.  Pt would benefit from skilled PT to address noted impairments and functional limitations (see below for any additional details).  Upon hospital discharge, pt would benefit from SNF.    Recommendations for follow up therapy are one component of a multi-disciplinary discharge planning process, led by the attending physician.  Recommendations may be updated based on patient status, additional functional criteria and insurance authorization.  Follow Up Recommendations Skilled nursing-short term rehab (<3 hours/day)    Assistance Recommended at Discharge Intermittent Supervision/Assistance  Patient can return home with the following  A little help with walking and/or transfers;Assistance  with cooking/housework;Assist for transportation;A little help with bathing/dressing/bathroom    Equipment Recommendations Rolling walker (2 wheels);BSC/3in1  Recommendations for Other Services  OT consult    Functional Status Assessment Patient has had a recent decline in their functional status and demonstrates the ability to make significant improvements in function in a reasonable and predictable amount of time.     Precautions / Restrictions Precautions Precautions: Knee;Fall Precaution Booklet Issued: Yes (comment) Precaution Comments: H/o fainting Restrictions Weight Bearing Restrictions: Yes LLE Weight Bearing: Weight bearing as tolerated Other Position/Activity Restrictions: Wears special insert for shoe for ambulation      Mobility  Bed Mobility Overal bed mobility: Needs Assistance Bed Mobility: Supine to Sit     Supine to sit: Supervision, HOB elevated     General bed mobility comments: mild increased effort to perform on own    Transfers Overall transfer level: Needs assistance Equipment used: Rolling walker (2 wheels) Transfers: Sit to/from Stand Sit to Stand: Min assist           General transfer comment: vc's for UE/LE placement and walker use    Ambulation/Gait Ambulation/Gait assistance: Min guard, Min assist Gait Distance (Feet): 3 Feet (bed to recliner) Assistive device: Rolling walker (2 wheels)   Gait velocity: decreased     General Gait Details: antalgic; decreased stance time L LE; vc's for walker use and LE sequencing  Stairs            Wheelchair Mobility    Modified Rankin (Stroke Patients Only)       Balance Overall balance assessment: Needs assistance Sitting-balance support: No upper extremity supported, Feet supported Sitting balance-Leahy Scale: Good Sitting  balance - Comments: steady sitting reaching within BOS   Standing balance support: Single extremity supported Standing balance-Leahy Scale:  Fair Standing balance comment: pt steady standing with at least single UE support                             Pertinent Vitals/Pain Pain Assessment Pain Assessment: 0-10 Pain Score: 4  Pain Location: L knee Pain Descriptors / Indicators: Sore Pain Intervention(s): Limited activity within patient's tolerance, Monitored during session, Premedicated before session, Repositioned, Other (comment) (polar care applied) HR and O2 on room air stable and WFL throughout treatment session.    Home Living Family/patient expects to be discharged to:: Private residence Living Arrangements: Alone   Type of Home: House Home Access: Level entry       Home Layout: One level Home Equipment: Grab bars - tub/shower Additional Comments: Lives at [place] Prior Level of Function : Independent/Modified Independent                     Hand Dominance        Extremity/Trunk Assessment   Upper Extremity Assessment Upper Extremity Assessment: Overall WFL for tasks assessed    Lower Extremity Assessment Lower Extremity Assessment: LLE deficits/detail LLE Deficits / Details: able to perform L LE SLR independently; at least 3/5 hip flexion and ankle DF/PF AROM    Cervical / Trunk Assessment Cervical / Trunk Assessment: Normal  Communication   Communication: No difficulties  Cognition Arousal/Alertness: Awake/alert Behavior During Therapy: WFL for tasks assessed/performed Overall Cognitive Status: Within Functional Limits for tasks assessed                                          General Comments  Nursing cleared pt for participation in physical therapy.  Pt agreeable to PT session.  Pt's daughter present during most of session.    Exercises Total Joint Exercises Ankle Circles/Pumps: AROM, Strengthening, Both, 10 reps, Supine Quad Sets: AROM, Strengthening, Both, 10 reps, Supine Short Arc Quad: AAROM,  Strengthening, Left, 10 reps, Supine Heel Slides: AAROM, Strengthening, Left, 10 reps, Supine Hip ABduction/ADduction: AAROM, Strengthening, Left, 10 reps, Supine Straight Leg Raises: AROM, Strengthening, Left, 10 reps, Supine Goniometric ROM: L knee AROM 8-90 degrees   Assessment/Plan    PT Assessment Patient needs continued PT services  PT Problem List Decreased strength;Decreased range of motion;Decreased activity tolerance;Decreased balance;Decreased mobility;Decreased knowledge of use of DME;Decreased knowledge of precautions;Pain;Decreased skin integrity       PT Treatment Interventions DME instruction;Gait training;Functional mobility training;Therapeutic activities;Therapeutic exercise;Balance training;Patient/family education    PT Goals (Current goals can be found in the Care Plan section)  Acute Rehab PT Goals Patient Stated Goal: to improve strength and mobility PT Goal Formulation: With patient Time For Goal Achievement: 01/03/22 Potential to Achieve Goals: Good    Frequency BID     Co-evaluation               AM-PAC PT "6 Clicks" Mobility  Outcome Measure Help needed turning from your back to your side while in a flat bed without using bedrails?: None Help needed moving from lying on your back to sitting on the side of a flat bed without using bedrails?: A Little Help needed moving to and from a bed to a chair (including  a wheelchair)?: A Little Help needed standing up from a chair using your arms (e.g., wheelchair or bedside chair)?: A Little Help needed to walk in hospital room?: A Little Help needed climbing 3-5 steps with a railing? : A Lot 6 Click Score: 18    End of Session Equipment Utilized During Treatment: Gait belt Activity Tolerance: Other (comment) (pt c/o feeling faint after getting to chair but resolved within a few minutes) Patient left: in chair;with call bell/phone within reach;with chair alarm set;with family/visitor present;with SCD's  reapplied;Other (comment) (B heels floating via towel rolls; knee immobilizer in place L LE) Nurse Communication: Mobility status;Precautions;Weight bearing status PT Visit Diagnosis: Other abnormalities of gait and mobility (R26.89);Muscle weakness (generalized) (M62.81);Pain Pain - Right/Left: Left Pain - part of body: Knee    Time: 8016-5537 PT Time Calculation (min) (ACUTE ONLY): 65 min   Charges:   PT Evaluation $PT Eval Low Complexity: 1 Low PT Treatments $Therapeutic Exercise: 8-22 mins $Therapeutic Activity: 23-37 mins       Leitha Bleak, PT 12/20/21, 6:11 PM

## 2021-12-20 NOTE — Transfer of Care (Signed)
Immediate Anesthesia Transfer of Care Note  Patient: Barbara Burgess  Procedure(s) Performed: TOTAL KNEE ARTHROPLASTY (Left: Knee)  Patient Location: PACU  Anesthesia Type:General  Level of Consciousness: awake, alert  and oriented  Airway & Oxygen Therapy: Patient Spontanous Breathing and Patient connected to nasal cannula oxygen  Post-op Assessment: Report given to RN and Post -op Vital signs reviewed and stable  Post vital signs: Reviewed and stable  Last Vitals:  Vitals Value Taken Time  BP    Temp    Pulse    Resp    SpO2      Last Pain:  Vitals:   12/20/21 0643  TempSrc: Temporal  PainSc: 0-No pain         Complications: No notable events documented.

## 2021-12-20 NOTE — Plan of Care (Signed)

## 2021-12-20 NOTE — Op Note (Signed)
DATE OF SURGERY:  12/20/2021 TIME: 11:16 AM  PATIENT NAME:  Barbara Burgess   AGE: 76 y.o.    PRE-OPERATIVE DIAGNOSIS:  Left Knee Osteoarthritis  POST-OPERATIVE DIAGNOSIS:  Same  PROCEDURE:  Procedure(s): TOTAL KNEE ARTHROPLASTY  SURGEON:  Thornton Park, MD   ASSISTANT:  Roland Rack, PA  OPERATIVE IMPLANTS: Depuy PFC Sigma, Posterior Stabilized Femural component size 4 narrow, Tibia size rotating platform component size 3, Patella polyethylene 3-peg oval button size 35, with a 10 mm polyethylene insert.  EBL:  50  TOURNIQUET TIME:  107 minutes  PREOPERATIVE INDICATIONS:  Barbara Burgess is an 76 y.o. female who has a diagnosis of  Left Knee Fracture and elected for a Left total knee arthroplasty after failing nonoperative treatment.  Their knee pain significantly impacts their activity of daily living.  Radiographs have demonstrated tricompartmental osteoarthritis joint space narrowing, osteophytes, and subchondral sclerosis .  The risks, benefits, and alternatives were discussed at length including but not limited to the risks of infection, bleeding, nerve or blood vessel injury, knee stiffness, fracture, dislocation, loosening or failure of the hardware and the need for further surgery. Medical risks include but not limited to DVT and pulmonary embolism, myocardial infarction, stroke, pneumonia, respiratory failure and death. I discussed these risks with the patient in my office prior to the date of surgery. They understood these risks and were willing to proceed.  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  During the case the patient was coughing and therefore intubated.  A Foley catheter was placed.  IV antibiotics were given. Patient received  Ancef 2 g IV and clindamycin 600 mg IV.  Patient also received tranexamic acid IV prior to the inflation of the tourniquet.  The left lower extremity was prepped and draped in the usual sterile fashion.  A time out was performed to verify the patient's name, date of birth, medical record number, correct site of surgery and correct procedure to be performed. The timeout was also used to confirm the patient received antibiotics and that appropriate instruments, implants and radiographs studies were available in the room.  The leg was elevated and exsanguinated with an Esmarch and the tourniquet was inflated to 275 mmHg for 107 minutes..  A midline incision was made over the left knee. Full-thickness skin flaps were developed. A medial parapatellar arthrotomy was then made and the patella everted and the knee was brought into 90 of flexion. Hoffa's fat pad along with the cruciate ligaments and medial and lateral menisci were resected.   The distal femoral intramedullary canal was opened with a drill and the intramedullary distal femoral cutting jig was inserted into the femoral canal pinned into position. It was set at 5 degrees resecting 10 mm off the distal femur.  Care was taken to protect the collateral ligaments during distal femoral resection.  The distal femoral resection was performed with an oscillating saw. The femoral cutting guide was then removed.  The extramedullary tibial cutting guide was then placed using the anterior tibial crest and second ray of the foot as a references.  The tibial cutting guide was adjusted to allow for appropriate posterior slope.  The tibial cutting block was pinned into position. The slotted stylus was used to measure the proximal tibial resection of 10 mm off the high  lateral side.  The tibial long rod alignment guide was then used to confirm position of the cutting block. A third cross pin through the tibial cutting block was then drilled into position to allow for  rotational stability. Care was taken during the tibial resection to protect the medial and collateral ligaments.  The resected tibial bone was removed along with the posterior horns of the menisci.  The PCL was sacrificed.  Extension gap was measured with a spacer block and alignment and extension was confirmed using a long alignment rod.  The distal IT band was pie crusted to help soft tissue balancing.  The attention was then turned back to the femur. The posterior referencing distal femoral sizing guide was applied to the distal femur.  The femur was sized to be a size 4 narrow. Rotation of the referencing guide was checked with the epicondylar axis and Whitesides line. Then the 4-in-1 cutting jig was then applied to the distal femur. A stylus was used to confirm that the anterior femur would not be notched.   Then the anterior, posterior and chamfer femoral cuts were then made with an oscillating saw.  The flexion gap was then measured with a flexion spacer block and long alignment rod and was found to be symmetric with the extension gap and perpendicular to mechanical axis of the tibia.  The distal femoral preparation was completed by performing the posterior stabilized box cut using the cutting block. The entry site for the intramedullary femoral guide was filled with autologous bone graft from bone previously resected earlier in the case.  The proximal tibia plateau was then sized with trial trays. The best coverage was achieved with a size 3. This tibial tray was then pinned into position. The proximal tibia was then prepared with the reamer and keel punch.  After tibial preparation was completed, all trial components were inserted with polyethylene trials.  The knee was found to have excellent balance and full motion with a size 10 mm tibial polyethylene insert..    The attention was then turned to preparation of the patella. The thickness of the patella was measured with a caliper, the diameter  measured with the patella templates.  The patella resection was then made with an oscillating saw using the patella cutting guide.  The final patellar component was a 35 mm.  3 peg holes for the patella component were then drilled. The trial patella was then placed. Knee was taken through a full range of motion and deemed to be stable with the trial components. All trial components were then removed. The knee capsule was then injected with Exparel.  The knee joint capsule was injected with a mixture of quarter percent Marcaine, Toradol and morphine to assist with postoperative pain relief.  The joint was copiously irrigated with pulse lavage.  The final total knee arthroplasty components were then cemented into place with a 10 mm trial polyethylene insert and all excess methylmethacrylate was removed.  The joint was again copiously irrigated. After the cement had hardened the knee was again taken through a full range of motion. It was felt to be most stable with the 10 mm tibial polyethylene insert. The actual tibial polyethylene insert was then placed.   The knee was taken through a range of motion and the patella tracked well and the knee was again irrigated copiously.    The medial arthrotomy was closed with #1 Ethibond. The subcutaneous tissue closed with 0 and 2-0 vicryl, and skin approximated with staples.  A dry sterile and compressive dressing was applied.  A Polar Care was applied to the operative knee along with a knee immobilizer.  The patient was awakened and brought to the PACU in stable and satisfactory condition.  All sharp, lap and instrument counts were correct at the conclusion the case. I spoke with the patient's daughter in the postop consultation room to let her know the case had been performed without complication and the patient was stable in recovery room.

## 2021-12-20 NOTE — Progress Notes (Signed)
°  Subjective:  POST OP CHECK s/p left TKA.   Patient reports left knee pain as moderate now but was 8 out of 10 earlier once her block wore off.  Patient's daughter is at the bedside.  Objective:   VITALS:   Vitals:   12/20/21 1330 12/20/21 1347 12/20/21 1358 12/20/21 1426  BP: 132/69  132/69 124/72  Pulse: 85 85 93 93  Resp: (!) 21 (!) 21 17 18   Temp:   98.2 F (36.8 C) 98.1 F (36.7 C)  TempSrc:      SpO2: 98% 100% 98% 96%  Weight:      Height:        PHYSICAL EXAM: Left lower extremity Neurovascular intact Sensation intact distally Intact pulses distally Dorsiflexion/Plantar flexion intact Incision: dressing C/D/I No cellulitis present Compartment soft  LABS  Results for orders placed or performed during the hospital encounter of 12/20/21 (from the past 24 hour(s))  CBC     Status: Abnormal   Collection Time: 12/20/21  3:01 PM  Result Value Ref Range   WBC 14.2 (H) 4.0 - 10.5 K/uL   RBC 4.42 3.87 - 5.11 MIL/uL   Hemoglobin 13.3 12.0 - 15.0 g/dL   HCT 39.7 36.0 - 46.0 %   MCV 89.8 80.0 - 100.0 fL   MCH 30.1 26.0 - 34.0 pg   MCHC 33.5 30.0 - 36.0 g/dL   RDW 14.7 11.5 - 15.5 %   Platelets 242 150 - 400 K/uL   nRBC 0.0 0.0 - 0.2 %  Creatinine, serum     Status: None   Collection Time: 12/20/21  3:01 PM  Result Value Ref Range   Creatinine, Ser 0.64 0.44 - 1.00 mg/dL   GFR, Estimated >60 >60 mL/min    DG Knee Left Port  Result Date: 12/20/2021 CLINICAL DATA:  Left total knee arthroplasty EXAM: PORTABLE LEFT KNEE - 1-2 VIEW COMPARISON:  None. FINDINGS: Postsurgical changes from left total knee arthroplasty. Arthroplasty components are in their expected alignment. No periprosthetic fracture or evidence of other complication. 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 Expected postoperative changes within the overlying soft tissues. IMPRESSION: Expected postoperative appearance status  post left total knee arthroplasty. Electronically Signed   By: Davina Poke D.O.   On: 12/20/2021 11:54    Assessment/Plan: Day of Surgery   Principal Problem:   S/P TKR (total knee replacement) using cement, left  Continue the patient's narcotic pain medication to Dilaudid and oxycodone.  Patient will continue to elevate her left lower extremity and use her Polar Care to reduce swelling.  Patient will have labs drawn in the morning.  Her Foley catheter will be removed tomorrow morning.  Patient will begin physical therapy this afternoon and continue tomorrow.  She will complete 24 hours of postop antibiotics.  I reviewed the patient's postoperative x-ray which demonstrates the total knee arthroplasty components are well-positioned and there is no evidence of postop complication.    Thornton Park , MD 12/20/2021, 3:40 PM

## 2021-12-21 ENCOUNTER — Encounter: Payer: Self-pay | Admitting: Orthopedic Surgery

## 2021-12-21 DIAGNOSIS — M1712 Unilateral primary osteoarthritis, left knee: Secondary | ICD-10-CM | POA: Diagnosis not present

## 2021-12-21 LAB — BASIC METABOLIC PANEL
Anion gap: 6 (ref 5–15)
BUN: 14 mg/dL (ref 8–23)
CO2: 24 mmol/L (ref 22–32)
Calcium: 8.6 mg/dL — ABNORMAL LOW (ref 8.9–10.3)
Chloride: 108 mmol/L (ref 98–111)
Creatinine, Ser: 0.66 mg/dL (ref 0.44–1.00)
GFR, Estimated: >60 mL/min (ref >60)
Glucose, Bld: 157 mg/dL — ABNORMAL HIGH (ref 70–99)
Potassium: 4.7 mmol/L (ref 3.5–5.1)
Sodium: 138 mmol/L (ref 135–145)

## 2021-12-21 LAB — CBC
HCT: 34.1 % — ABNORMAL LOW (ref 36.0–46.0)
Hemoglobin: 11.4 g/dL — ABNORMAL LOW (ref 12.0–15.0)
MCH: 30.1 pg (ref 26.0–34.0)
MCHC: 33.4 g/dL (ref 30.0–36.0)
MCV: 90 fL (ref 80.0–100.0)
Platelets: 220 10*3/uL (ref 150–400)
RBC: 3.79 MIL/uL — ABNORMAL LOW (ref 3.87–5.11)
RDW: 14.8 % (ref 11.5–15.5)
WBC: 15.6 10*3/uL — ABNORMAL HIGH (ref 4.0–10.5)
nRBC: 0 % (ref 0.0–0.2)

## 2021-12-21 MED ORDER — MELATONIN 5 MG PO TABS
5.0000 mg | ORAL_TABLET | Freq: Every evening | ORAL | Status: DC | PRN
  Administered 2021-12-21 – 2021-12-22 (×2): 5 mg via ORAL
  Filled 2021-12-21 (×2): qty 1

## 2021-12-21 NOTE — Plan of Care (Signed)
°  Problem: Education: Goal: Knowledge of General Education information will improve Description: Including pain rating scale, medication(s)/side effects and non-pharmacologic comfort measures Outcome: Progressing   Problem: Health Behavior/Discharge Planning: Goal: Ability to manage health-related needs will improve Outcome: Progressing   Problem: Clinical Measurements: Goal: Ability to maintain clinical measurements within normal limits will improve Outcome: Progressing Goal: Will remain free from infection Outcome: Progressing Goal: Diagnostic test results will improve Outcome: Progressing Goal: Respiratory complications will improve Outcome: Progressing Goal: Cardiovascular complication will be avoided Outcome: Progressing   Problem: Activity: Goal: Risk for activity intolerance will decrease Outcome: Progressing   Problem: Nutrition: Goal: Adequate nutrition will be maintained Outcome: Progressing   Problem: Coping: Goal: Level of anxiety will decrease Outcome: Progressing   Problem: Elimination: Goal: Will not experience complications related to bowel motility Outcome: Progressing Goal: Will not experience complications related to urinary retention Outcome: Progressing   Problem: Pain Managment: Goal: General experience of comfort will improve Outcome: Progressing   Problem: Safety: Goal: Ability to remain free from injury will improve Outcome: Progressing   Problem: Skin Integrity: Goal: Risk for impaired skin integrity will decrease Outcome: Progressing   Problem: Education: Goal: Knowledge of the prescribed therapeutic regimen will improve Outcome: Progressing Goal: Individualized Educational Video(s) Outcome: Progressing   Problem: Activity: Goal: Ability to avoid complications of mobility impairment will improve Outcome: Progressing Goal: Range of joint motion will improve Outcome: Progressing   Problem: Clinical Measurements: Goal:  Postoperative complications will be avoided or minimized Outcome: Progressing   Problem: Pain Management: Goal: Pain level will decrease with appropriate interventions Outcome: Progressing   Problem: Skin Integrity: Goal: Will show signs of wound healing Outcome: Progressing   Problem: Education: Goal: Knowledge of the prescribed therapeutic regimen will improve Outcome: Progressing Goal: Individualized Educational Video(s) Outcome: Progressing   Problem: Activity: Goal: Ability to avoid complications of mobility impairment will improve Outcome: Progressing Goal: Range of joint motion will improve Outcome: Progressing   Problem: Clinical Measurements: Goal: Postoperative complications will be avoided or minimized Outcome: Progressing   Problem: Pain Management: Goal: Pain level will decrease with appropriate interventions Outcome: Progressing   Problem: Skin Integrity: Goal: Will show signs of wound healing Outcome: Progressing

## 2021-12-21 NOTE — TOC Initial Note (Signed)
Transition of Care [facility]) - Initial/Assessment Note    Patient Details  Name: Barbara Burgess MRN: [id number] Date of Birth: September 11, 1946  Transition of Care [facility]) CM/SW Contact:    Candie Chroman, LCSW Phone Number: 12/21/2021, 10:18 AM  Clinical Narrative:   CSW met with patient. No supports at bedside. CSW introduced role and explained that PT recommendations would be discussed. Patient confirmed she lives at [facility] and would like to go to the SNF side for short-term rehab. Admissions coordinator confirmed they can accept her. Started insurance authorization through [employer]. She would like for her daughter to transport rather than EMS. No further concerns. CSW encouraged patient to contact CSW as needed. CSW will continue to follow patient for support and facilitate discharge to SNF once medically stable.               Expected Discharge Plan: Skilled Nursing Facility Barriers to Discharge: Continued Medical Work up   Patient Goals and CMS Choice     Choice offered to / list presented to : Patient  Expected Discharge Plan and Services Expected Discharge Plan: [place] Choice: [place] Living arrangements for the past 2 months: [place]                                      Prior Living Arrangements/Services Living arrangements for the past 2 months: [place] Lives with:: Self Patient language and need for interpreter reviewed:: Yes Do you feel safe going back to the place where you live?: Yes      Need for Family Participation in Patient Care: Yes (Comment) Care giver support system in place?: Yes (comment)   Criminal Activity/Legal Involvement Pertinent to Current Situation/Hospitalization: No - Comment as needed  Activities of Daily Living Home Assistive Devices/Equipment: Grab bars in shower ADL Screening (condition at time of admission) Patient's  cognitive ability adequate to safely complete daily activities?: Yes Is the patient deaf or have difficulty hearing?: No Does the patient have difficulty seeing, even when wearing glasses/contacts?: No Does the patient have difficulty concentrating, remembering, or making decisions?: No Patient able to express need for assistance with ADLs?: Yes Does the patient have difficulty dressing or bathing?: No Independently performs ADLs?: Yes (appropriate for developmental age) Does the patient have difficulty walking or climbing stairs?: No Weakness of Legs: None Weakness of Arms/Hands: None  Permission Sought/Granted Permission sought to share information with : Facility Art therapist granted to share information with : Yes, Verbal Permission Granted     Permission granted to share info w AGENCY: [facility]        Emotional Assessment Appearance:: Appears stated age Attitude/Demeanor/Rapport: Engaged, Gracious Affect (typically observed): Accepting, Appropriate, Calm, Pleasant Orientation: : Oriented to Self, Oriented to Place, Oriented to  Time, Oriented to Situation Alcohol / Substance Use: Not Applicable Psych Involvement: No (comment)  Admission diagnosis:  S/P TKR (total knee replacement) using cement, left [Z96.652] Patient Active Problem List   Diagnosis Date Noted   S/P TKR (total knee replacement) using cement, left 12/20/2021   Sesamoiditis 05/06/2020   Hav (hallux abducto valgus), unspecified laterality 01/08/2020   Porokeratosis 01/08/2020   Insomnia    Rosacea    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[place])    History of melanoma    Hypertension    Hyperlipemia  Osteoarthritis of multiple joints    Arthritis of knee, left 07/14/2016   Foot pain 03/17/2013   Melanoma of skin ([place]) 09/13/2011   PCP:  Leonel Ramsay, MD Pharmacy:   [id number] [address] [place]Phone:  [phone]: [phone] [facility] [address] [place]Phone: [phone]: [phone]    Social Determinants of Health (SDOH) Interventions    Readmission Risk Interventions No flowsheet data found.

## 2021-12-21 NOTE — Progress Notes (Signed)
Physical Therapy Treatment Patient Details Name: Barbara Burgess MRN: [id number] DOB: [birth year] Today's Date: 12/21/2021   History of Present Illness Pt is a 76 y.o. female s/p L TKA secondary to OA 12/20/21.  PMH includes htn, B bunions, chronic ulcerative proctitis, shoulder impingement, insomnia.  Pt reports h/o fainting.    PT Comments    Pt was long sitting in bed upon arriving. She is A and O x 4 and eager to have her session. She was pre-medicated for pain and reports 6/10 pain in wt bearing. Knee immobilizer removed during session. Pt was easily able to exit L side of bed. Stood to [employer] with CGA and was able to ambulate to doorway of room. Distance limited by c/o dizziness. BP was stable." I think its from the pain medicine." She performed several exercises and demonstrates 5-95 degrees AROM. A lot of education on management of expectations going forward. Overall pt is progressing extremely well. She is planning to return to [place] for rehab once cleared medically. Author assisted pt with dressing, as requested. She will benefit from RW and [facility] at DC or once at rehab. Pt was sitting up in recliner with call bell in reach, breakfast tray in place, and call bell in reach. Acute PT will continue to follow per current POC.     Recommendations for follow up therapy are one component of a multi-disciplinary discharge planning process, led by the attending physician.  Recommendations may be updated based on patient status, additional functional criteria and insurance authorization.  Follow Up Recommendations  Skilled nursing-short term rehab (<3 hours/day)     Assistance Recommended at Discharge Intermittent Supervision/Assistance  Patient can return home with the following A little help with walking and/or transfers;Assistance with cooking/housework;Assist for transportation;A little help with bathing/dressing/bathroom   Equipment Recommendations  Rolling walker (2 wheels);BSC/3in1        Precautions / Restrictions Precautions Precautions: Knee;Fall Precaution Booklet Issued: Yes (comment) Precaution Comments: H/o fainting Required Braces or Orthoses: Knee Immobilizer - Left Restrictions Weight Bearing Restrictions: Yes LLE Weight Bearing: Weight bearing as tolerated     Mobility  Bed Mobility Overal bed mobility: Needs Assistance Bed Mobility: Supine to Sit     Supine to sit: Supervision, HOB elevated     General bed mobility comments: Increased time to perform with vcs for improved technique. HOB was elevated.    Transfers Overall transfer level: Needs assistance Equipment used: Rolling walker (2 wheels) Transfers: Sit to/from Stand Sit to Stand: Min guard           General transfer comment: CGA for safety with vcs for handplacement, fwd wt shift, and overall improved safety. slightly elevated bed height    Ambulation/Gait Ambulation/Gait assistance: Min guard Gait Distance (Feet): 40 Feet Assistive device: Rolling walker (2 wheels) Gait Pattern/deviations: Step-to pattern, Antalgic Gait velocity: decreased     General Gait Details: Pt was able to ambulate to doorway of room and then to recliner. Dizziness limited distance. BP was stable pre/post gait training. " I think its due to the pain medication and not having anything to eat yet."    Balance Overall balance assessment: Needs assistance Sitting-balance support: No upper extremity supported, Feet supported Sitting balance-Leahy Scale: Good Sitting balance - Comments: steady sitting reaching within BOS   Standing balance support: Bilateral upper extremity supported, During functional activity Standing balance-Leahy Scale: Good      Cognition Arousal/Alertness: Awake/alert Behavior During Therapy: WFL for tasks assessed/performed Overall Cognitive Status: Within Functional Limits for  tasks assessed      General Comments: Pt is A and O x 4.        Exercises Total Joint  Exercises Goniometric ROM: 5-95        Pertinent Vitals/Pain Pain Assessment Pain Assessment: 0-10 Pain Score: 6  Pain Location: L knee Pain Descriptors / Indicators: Sore Pain Intervention(s): Limited activity within patient's tolerance, Monitored during session, Premedicated before session, Repositioned, Ice applied     PT Goals (current goals can now be found in the care plan section) Acute Rehab PT Goals Patient Stated Goal: get better and go home Progress towards PT goals: Progressing toward goals    Frequency    BID      PT Plan Current plan remains appropriate       AM-PAC PT "6 Clicks" Mobility   Outcome Measure  Help needed turning from your back to your side while in a flat bed without using bedrails?: None Help needed moving from lying on your back to sitting on the side of a flat bed without using bedrails?: A Little Help needed moving to and from a bed to a chair (including a wheelchair)?: A Little Help needed standing up from a chair using your arms (e.g., wheelchair or bedside chair)?: A Little Help needed to walk in hospital room?: A Little Help needed climbing 3-5 steps with a railing? : A Lot 6 Click Score: 18    End of Session   Activity Tolerance: Patient tolerated treatment well;Other (comment) (limited by dizziness during ambulation. Suspect due to pain medication. BP was stable throughout.) Patient left: in chair;with call bell/phone within reach;with chair alarm set;with family/visitor present;with SCD's reapplied;Other (comment) Nurse Communication: Mobility status;Precautions;Weight bearing status PT Visit Diagnosis: Other abnormalities of gait and mobility (R26.89);Muscle weakness (generalized) (M62.81);Pain Pain - Right/Left: Left Pain - part of body: Knee     Time: 1829-9371 PT Time Calculation (min) (ACUTE ONLY): 60 min  Charges:  $Gait Training: 8-22 mins $Therapeutic Exercise: 23-37 mins $Therapeutic Activity: 8-22 mins                      Julaine Fusi PTA 12/21/21, 9:13 AM

## 2021-12-21 NOTE — Progress Notes (Addendum)
16 FC d/c per order at this time.Pt tolerated procedure w/o any c/o or difficulty.10cc of NS drained from the balloon. Cath tip intact.Pt encouraged to consume fluids so as to void  within 6 hours of FC removal.Pt expressed understanding.no verbal c/o or any ssx of distress.CL is within reach.

## 2021-12-21 NOTE — Anesthesia Postprocedure Evaluation (Signed)
Anesthesia Post Note  Patient: Barbara Burgess  Procedure(s) Performed: TOTAL KNEE ARTHROPLASTY (Left: Knee)  Patient location during evaluation: Nursing Unit Anesthesia Type: Spinal Level of consciousness: oriented and awake and alert Pain management: pain level controlled Vital Signs Assessment: post-procedure vital signs reviewed and stable Respiratory status: spontaneous breathing Cardiovascular status: blood pressure returned to baseline and stable Postop Assessment: no headache, no backache, no apparent nausea or vomiting and patient able to bend at knees Anesthetic complications: no   No notable events documented.   Last Vitals:  Vitals:   12/21/21 0346 12/21/21 0801  BP: (!) 128/57 (!) 121/58  Pulse: 71 77  Resp: 16 18  Temp: 36.6 C (!) 36.3 C  SpO2: 99% 100%    Last Pain:  Vitals:   12/21/21 0700  TempSrc:   PainSc: 6                  Precious Haws Jaylena Holloway

## 2021-12-21 NOTE — NC FL2 (Signed)
[place] LEVEL OF CARE SCREENING TOOL     IDENTIFICATION  Patient Name: Barbara Burgess Birthdate: 08-Jan-1946 Sex: female Admission Date (Current Location): 12/20/2021  [facility] and [place] Number:  Engineering geologist and Address:  [facility], [address], [place], [place] [address]      Provider Number: [id number]  Attending Physician Name and Address:  Thornton Park, MD  Relative Name and Phone Number:       Current Level of Care: Hospital Recommended Level of Care: [place] Prior Approval Number:    Date Approved/Denied:   PASRR Number: [id number]  Discharge Plan: SNF    Current Diagnoses: Patient Active Problem List   Diagnosis Date Noted   S/P TKR (total knee replacement) using cement, left 12/20/2021   Sesamoiditis 05/06/2020   Hav (hallux abducto valgus), unspecified laterality 01/08/2020   Porokeratosis 01/08/2020   Insomnia    Rosacea    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[place])    History of melanoma    Hypertension    Hyperlipemia    Osteoarthritis of multiple joints    Arthritis of knee, left 07/14/2016   Foot pain 03/17/2013   Melanoma of skin ([place]) 09/13/2011    Orientation RESPIRATION BLADDER Height & Weight     Self, Time, Situation, Place  Normal Continent Weight: 169 lb 1.5 oz (76.7 kg) Height:  5' 4"  (162.6 cm)  BEHAVIORAL SYMPTOMS/MOOD NEUROLOGICAL BOWEL NUTRITION STATUS   (None)  (None) Continent Diet (Regular)  AMBULATORY STATUS COMMUNICATION OF NEEDS Skin   Limited Assist Verbally Bruising, Surgical wounds (Incision on left knee: Compression wrap.)                       Personal Care Assistance Level of Assistance  Bathing, Feeding, Dressing Bathing Assistance: Limited assistance Feeding assistance: Independent Dressing Assistance: Limited assistance     Functional Limitations Info  Sight, Hearing, Speech Sight Info: Adequate Hearing Info: Adequate Speech Info:  Adequate    SPECIAL CARE FACTORS FREQUENCY  PT (By licensed PT), OT (By licensed OT)     PT Frequency: 5 x week OT Frequency: 5 x week            Contractures Contractures Info: Not present    Additional Factors Info  Code Status, Allergies Code Status Info: Full code Allergies Info: Atorvastatin           Current Medications (12/21/2021):  This is the current hospital active medication list Current Facility-Administered Medications  Medication Dose Route Frequency Provider Last Rate Last Admin   0.9 %  sodium chloride infusion   Intravenous Continuous Thornton Park, MD 75 mL/hr at 12/21/21 0322 New Bag at 12/21/21 0322   acetaminophen (TYLENOL) tablet 325-650 mg  325-650 mg Oral Q6H PRN Thornton Park, MD       amLODipine (NORVASC) tablet 5 mg  5 mg Oral Daily Thornton Park, MD   5 mg at 12/21/21 0851   azelastine (ASTELIN) 0.1 % nasal spray 2 spray  2 spray Each Nare Daily Thornton Park, MD   2 spray at 12/21/21 0853   bisacodyl (DULCOLAX) EC tablet 5 mg  5 mg Oral Daily PRN Thornton Park, MD       diphenhydrAMINE (BENADRYL) 12.5 MG/5ML elixir 12.5-25 mg  12.5-25 mg Oral Q4H PRN Thornton Park, MD       docusate sodium (COLACE) capsule 100 mg  100 mg Oral BID Thornton Park, MD   100 mg at 12/21/21 [phone]  enoxaparin (LOVENOX) injection 40 mg  40 mg Subcutaneous Q24H Thornton Park, MD   40 mg at 12/21/21 0852   fluticasone (FLONASE) 50 MCG/ACT nasal spray 1 spray  1 spray Each Nare QPM Thornton Park, MD       HYDROmorphone (DILAUDID) injection 0.5-1 mg  0.5-1 mg Intravenous Q3H PRN Thornton Park, MD       menthol-cetylpyridinium (CEPACOL) lozenge 3 mg  1 lozenge Oral PRN Thornton Park, MD       Or   phenol (CHLORASEPTIC) mouth spray 1 spray  1 spray Mouth/Throat PRN Thornton Park, MD       mesalamine (LIALDA) EC tablet 1.2 g  1.2 g Oral BID Thornton Park, MD   1.2 g at 12/21/21 0853   methocarbamol (ROBAXIN) tablet 500 mg  500 mg Oral Q6H  PRN Thornton Park, MD   500 mg at 12/20/21 1209   Or   methocarbamol (ROBAXIN) 500 mg in dextrose 5 % 50 mL IVPB  500 mg Intravenous Q6H PRN Thornton Park, MD       metroNIDAZOLE (METROGEL) 5.39 % gel 1 application  1 application Topical Daily Thornton Park, MD   1 application at 76/73/41 0853   ondansetron (ZOFRAN) tablet 4 mg  4 mg Oral Q6H PRN Thornton Park, MD       Or   ondansetron [facility]) injection 4 mg  4 mg Intravenous Q6H PRN Thornton Park, MD       oxyCODONE (Oxy IR/ROXICODONE) immediate release tablet 5-10 mg  5-10 mg Oral Q4H PRN Thornton Park, MD   5 mg at 12/21/21 9379   rosuvastatin (CRESTOR) tablet 10 mg  10 mg Oral QPM Thornton Park, MD   10 mg at 12/20/21 1733   senna-docusate (Senokot-S) tablet 1 tablet  1 tablet Oral QHS PRN Thornton Park, MD       sodium phosphate (FLEET) 7-19 GM/118ML enema 1 enema  1 enema Rectal Once PRN Thornton Park, MD       traMADol Veatrice Bourbon) tablet 50 mg  50 mg Oral Q6H Thornton Park, MD   50 mg at 12/21/21 0240   traZODone (DESYREL) tablet 100 mg  100 mg Oral QHS Thornton Park, MD   100 mg at 12/20/21 2123     Discharge Medications: Please see discharge summary for a list of discharge medications.  Relevant Imaging Results:  Relevant Lab Results:   Additional Information SS#: [id number]. COVID vaccines: 12/09/19, 01/06/20, 01/26/20, 08/13/20  Candie Chroman, LCSW

## 2021-12-21 NOTE — Evaluation (Signed)
Occupational Therapy Evaluation Patient Details Name: Barbara Burgess MRN: [id number] DOB: 1946/07/27 Today's Date: 12/21/2021   History of Present Illness Pt is a 76 y.o. female s/p L TKA secondary to OA 12/20/21.  PMH includes htn, B bunions, chronic ulcerative proctitis, shoulder impingement, insomnia.  Pt reports h/o fainting.   Clinical Impression   Pt seen for OT evaluation this date in setting of acute hospitalization s/p L TKA. She reports increased pain at 8/10 this date when OT presents to room. Pt's RN called and administers pain medication. Pt reports being INDEP at baseline including driving and lives in [place]. She presents this date mostly pain limited, once RN administers medication, pt reports improvement to 5/10. She is able to get to EOB sitting with SUPV only, but defers standing at this time citing pain. Pt currently requires MOD A for LB ADLs d/t limitations as expected post-operatively. In addition, she requires MAX A for polar care and compression stocking donning/doffing.  OT educates pt and her daughter re: polar care and compression stocking mgt as well as modifications/AE for LB ADLs. Pt with good understanding. Left in bed with all needs met and in reach, her daughter setting her up for dinner. Will continue to follow acutely.      Recommendations for follow up therapy are one component of a multi-disciplinary discharge planning process, led by the attending physician.  Recommendations may be updated based on patient status, additional functional criteria and insurance authorization.   Follow Up Recommendations  Skilled nursing-short term rehab (<3 hours/day)    Assistance Recommended at Discharge Set up Supervision/Assistance  Patient can return home with the following A little help with bathing/dressing/bathroom;Assistance with cooking/housework;Assist for transportation;Help with stairs or ramp for entrance    Functional Status Assessment   Patient has had a recent decline in their functional status and demonstrates the ability to make significant improvements in function in a reasonable and predictable amount of time.  Equipment Recommendations  BSC/3in1;Tub/shower seat;Other (comment) (2ww)    Recommendations for Other Services       Precautions / Restrictions Precautions Precautions: Knee;Fall Precaution Comments: H/o fainting Required Braces or Orthoses: Knee Immobilizer - Left Restrictions Weight Bearing Restrictions: Yes LLE Weight Bearing: Weight bearing as tolerated Other Position/Activity Restrictions: Wears special insert for shoe for ambulation      Mobility Bed Mobility Overal bed mobility: Needs Assistance Bed Mobility: Supine to Sit, Sit to Supine     Supine to sit: Supervision, HOB elevated Sit to supine: Min guard, Min assist   General bed mobility comments: increased time to manage L LE, increased assist for L LE back to bed    Transfers                   General transfer comment: deferrede at this time citing pain      Balance Overall balance assessment: Needs assistance Sitting-balance support: No upper extremity supported, Feet supported Sitting balance-Leahy Scale: Good Sitting balance - Comments: steady sitting reaching within BOS                                   ADL either performed or assessed with clinical judgement   ADL  General ADL Comments: requires SETUP for UB ADLs, MOD A for seated LB ADLs.     Vision Patient Visual Report: No change from baseline       Perception     Praxis      Pertinent Vitals/Pain Pain Assessment Pain Assessment: 0-10 Pain Score: 8  Pain Location: L knee, changes to 5 after pain meds Pain Descriptors / Indicators: Grimacing, Guarding, Tender Pain Intervention(s): Limited activity within patient's tolerance, Monitored during session, Repositioned, Patient  requesting pain meds-RN notified, RN gave pain meds during session, Ice applied     Hand Dominance     Extremity/Trunk Assessment Upper Extremity Assessment Upper Extremity Assessment: Overall WFL for tasks assessed   Lower Extremity Assessment Lower Extremity Assessment: Defer to PT evaluation;RLE deficits/detail;LLE deficits/detail RLE Deficits / Details: WFL LLE Deficits / Details: able to perform L LE SLR independently; at least 3/5 hip flexion and ankle DF/PF AROM, somewhat limited tolerance for knee flexion as expected, impacting ADLs.       Communication Communication Communication: No difficulties   Cognition Arousal/Alertness: Awake/alert Behavior During Therapy: WFL for tasks assessed/performed Overall Cognitive Status: Within Functional Limits for tasks assessed                                 General Comments: Pt is A and O x 4.     General Comments       Exercises Other Exercises Other Exercises: OT ed with pt and her daughter re: polar care mgt, compression stocking mgt, LB ADLs with modifications as needed.   Shoulder Instructions      Home Living Family/patient expects to be discharged to:: Private residence ([place] at [facility]) Living Arrangements: Alone Available Help at Discharge: Family Type of Home: House Home Access: Level entry     [place]: One level     Bathroom Shower/Tub: Occupational psychologist: [place]: Grab bars - tub/shower   Additional Comments: Lives at [place]      Prior Functioning/Environment Prior Level of Function : Independent/Modified Independent                        OT Problem List: Decreased range of motion;Decreased activity tolerance      OT Treatment/Interventions: Self-care/ADL training;Therapeutic exercise;DME and/or AE instruction;Therapeutic activities;Patient/family education;Balance training    OT Goals(Current goals can be  found in the care plan section) Acute Rehab OT Goals Patient Stated Goal: to go home OT Goal Formulation: With patient Time For Goal Achievement: 01/04/22 Potential to Achieve Goals: Good ADL Goals Pt Will Perform Lower Body Dressing: with supervision;with adaptive equipment Pt Will Transfer to Toilet: with supervision;with min guard assist;ambulating Pt Will Perform Toileting - Clothing Manipulation and hygiene: with supervision;with min guard assist;sit to/from stand  OT Frequency: Min 2X/week    Co-evaluation              AM-PAC OT "6 Clicks" Daily Activity     Outcome Measure Help from another person eating meals?: None Help from another person taking care of personal grooming?: A Little Help from another person toileting, which includes using toliet, bedpan, or urinal?: A Little Help from another person bathing (including washing, rinsing, drying)?: A Lot Help from another person to put on and taking off regular upper body clothing?: A Little Help from another person to put on and taking  off regular lower body clothing?: A Lot 6 Click Score: 17   End of Session Nurse Communication: Mobility status  Activity Tolerance: Patient tolerated treatment well Patient left: in bed;with call bell/phone within reach;with bed alarm set;with family/visitor present  OT Visit Diagnosis: Unsteadiness on feet (R26.81);History of falling (Z91.81)                Time: 2094-7096 OT Time Calculation (min): 53 min Charges:  OT General Charges $OT Visit: 1 Visit OT Evaluation $OT Eval Moderate Complexity: 1 Mod OT Treatments $Self Care/Home Management : 23-37 mins $Therapeutic Activity: 8-22 mins  Gerrianne Scale, MS, OTR/L ascom [phone] 12/21/21, 7:16 PM

## 2021-12-21 NOTE — Progress Notes (Signed)
Subjective:  POD #1 s/p left total knee arthroplasty.   Patient reports left knee pain as moderate.  Her daughter is at the bedside.  Objective:   VITALS:   Vitals:   12/20/21 1656 12/21/21 0346 12/21/21 0801 12/21/21 1132  BP: (!) 117/47 (!) 128/57 (!) 121/58 124/63  Pulse: 79 71 77 62  Resp: 16 16 18 18   Temp: 98 F (36.7 C) 97.8 F (36.6 C) (!) 97.4 F (36.3 C) 98.6 F (37 C)  TempSrc:      SpO2: 98% 99% 100% 99%  Weight:      Height:        PHYSICAL EXAM: For lower extremity: Neurovascular intact Sensation intact distally Intact pulses distally Dorsiflexion/Plantar flexion intact Incision: dressing C/D/I Compartment soft  LABS  Results for orders placed or performed during the hospital encounter of 12/20/21 (from the past 24 hour(s))  CBC     Status: Abnormal   Collection Time: 12/20/21  3:01 PM  Result Value Ref Range   WBC 14.2 (H) 4.0 - 10.5 K/uL   RBC 4.42 3.87 - 5.11 MIL/uL   Hemoglobin 13.3 12.0 - 15.0 g/dL   HCT 39.7 36.0 - 46.0 %   MCV 89.8 80.0 - 100.0 fL   MCH 30.1 26.0 - 34.0 pg   MCHC 33.5 30.0 - 36.0 g/dL   RDW 14.7 11.5 - 15.5 %   Platelets 242 150 - 400 K/uL   nRBC 0.0 0.0 - 0.2 %  Creatinine, serum     Status: None   Collection Time: 12/20/21  3:01 PM  Result Value Ref Range   Creatinine, Ser 0.64 0.44 - 1.00 mg/dL   GFR, Estimated >60 >60 mL/min  CBC     Status: Abnormal   Collection Time: 12/21/21  4:11 AM  Result Value Ref Range   WBC 15.6 (H) 4.0 - 10.5 K/uL   RBC 3.79 (L) 3.87 - 5.11 MIL/uL   Hemoglobin 11.4 (L) 12.0 - 15.0 g/dL   HCT 34.1 (L) 36.0 - 46.0 %   MCV 90.0 80.0 - 100.0 fL   MCH 30.1 26.0 - 34.0 pg   MCHC 33.4 30.0 - 36.0 g/dL   RDW 14.8 11.5 - 15.5 %   Platelets 220 150 - 400 K/uL   nRBC 0.0 0.0 - 0.2 %  Basic metabolic panel     Status: Abnormal   Collection Time: 12/21/21  4:11 AM  Result Value Ref Range   Sodium 138 135 - 145 mmol/L   Potassium 4.7 3.5 - 5.1 mmol/L   Chloride 108 98 - 111 mmol/L   CO2 24  22 - 32 mmol/L   Glucose, Bld 157 (H) 70 - 99 mg/dL   BUN 14 8 - 23 mg/dL   Creatinine, Ser 0.66 0.44 - 1.00 mg/dL   Calcium 8.6 (L) 8.9 - 10.3 mg/dL   GFR, Estimated >60 >60 mL/min   Anion gap 6 5 - 15    DG Knee Left Port  Result Date: 12/20/2021 CLINICAL DATA:  Left total knee arthroplasty EXAM: PORTABLE LEFT KNEE - 1-2 VIEW COMPARISON:  None. FINDINGS: Postsurgical changes from left total knee arthroplasty. Arthroplasty components are in their expected alignment. No periprosthetic fracture or evidence of other complication. 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 Expected postoperative changes within the overlying soft tissues. IMPRESSION: Expected postoperative appearance status post left total knee arthroplasty. Electronically Signed   By: Davina Poke D.O.   On: 12/20/2021 11:54  Assessment/Plan: 1 Day Post-Op   Principal Problem:   S/P TKR (total knee replacement) using cement, left  Patient still with moderate left knee pain.  Continue current pain management.  Patient will continue with physical therapy.  Hemoglobin and hematocrit are stable.  Continue Lovenox for DVT prophylaxis.  COVID test ordered for possible discharge to [facility] rehab tomorrow.    Thornton Park , MD 12/21/2021, 1:06 PM

## 2021-12-21 NOTE — Progress Notes (Signed)
PT Cancellation Note  Patient Details Name: Barbara Burgess MRN: [id number] DOB: [birth year]   Cancelled Treatment:     PT attempt. 2nd attempt this afternoon. Pt endorsing much more severe pain thi afternoon versus this morning. OT currently in room working with pt. RN aware of request for more pain medication when able. Author will return in the morning and continue to progress pt to PLOF as able per current POC.     Willette Pa 12/21/2021, 5:14 PM

## 2021-12-21 NOTE — Care Management Obs Status (Signed)
[facility] NOTIFICATION   Patient Details  Name: Barbara Burgess MRN: [id number] Date of Birth: September 05, 1946   Medicare Observation Status Notification Given:  Yes    Candie Chroman, LCSW 12/21/2021, 9:47 AM

## 2021-12-22 DIAGNOSIS — Z8249 Family history of ischemic heart disease and other diseases of the circulatory system: Secondary | ICD-10-CM | POA: Diagnosis not present

## 2021-12-22 DIAGNOSIS — Z820 Family history of epilepsy and other diseases of the nervous system: Secondary | ICD-10-CM | POA: Diagnosis not present

## 2021-12-22 DIAGNOSIS — Z87891 Personal history of nicotine dependence: Secondary | ICD-10-CM | POA: Diagnosis not present

## 2021-12-22 DIAGNOSIS — Z79899 Other long term (current) drug therapy: Secondary | ICD-10-CM | POA: Diagnosis not present

## 2021-12-22 DIAGNOSIS — M1712 Unilateral primary osteoarthritis, left knee: Secondary | ICD-10-CM | POA: Diagnosis present

## 2021-12-22 DIAGNOSIS — G4700 Insomnia, unspecified: Secondary | ICD-10-CM | POA: Diagnosis not present

## 2021-12-22 DIAGNOSIS — Z20822 Contact with and (suspected) exposure to covid-19: Secondary | ICD-10-CM | POA: Diagnosis not present

## 2021-12-22 DIAGNOSIS — Z888 Allergy status to other drugs, medicaments and biological substances status: Secondary | ICD-10-CM | POA: Diagnosis not present

## 2021-12-22 DIAGNOSIS — Z85820 Personal history of malignant melanoma of skin: Secondary | ICD-10-CM | POA: Diagnosis not present

## 2021-12-22 DIAGNOSIS — I10 Essential (primary) hypertension: Secondary | ICD-10-CM | POA: Diagnosis not present

## 2021-12-22 DIAGNOSIS — Z9841 Cataract extraction status, right eye: Secondary | ICD-10-CM | POA: Diagnosis not present

## 2021-12-22 DIAGNOSIS — E785 Hyperlipidemia, unspecified: Secondary | ICD-10-CM | POA: Diagnosis not present

## 2021-12-22 DIAGNOSIS — Z801 Family history of malignant neoplasm of trachea, bronchus and lung: Secondary | ICD-10-CM | POA: Diagnosis not present

## 2021-12-22 DIAGNOSIS — Z961 Presence of intraocular lens: Secondary | ICD-10-CM | POA: Diagnosis not present

## 2021-12-22 DIAGNOSIS — Z9842 Cataract extraction status, left eye: Secondary | ICD-10-CM | POA: Diagnosis not present

## 2021-12-22 LAB — CBC
HCT: 30 % — ABNORMAL LOW (ref 36.0–46.0)
Hemoglobin: 10.1 g/dL — ABNORMAL LOW (ref 12.0–15.0)
MCH: 30.3 pg (ref 26.0–34.0)
MCHC: 33.7 g/dL (ref 30.0–36.0)
MCV: 90.1 fL (ref 80.0–100.0)
Platelets: 191 10*3/uL (ref 150–400)
RBC: 3.33 MIL/uL — ABNORMAL LOW (ref 3.87–5.11)
RDW: 15.1 % (ref 11.5–15.5)
WBC: 9.4 10*3/uL (ref 4.0–10.5)
nRBC: 0 % (ref 0.0–0.2)

## 2021-12-22 LAB — SARS CORONAVIRUS 2 (TAT 6-24 HRS): SARS Coronavirus 2: NEGATIVE

## 2021-12-22 NOTE — Progress Notes (Signed)
Physical Therapy Treatment Patient Details Name: Barbara Burgess MRN: [id number] DOB: Jun 02, 1946 Today's Date: 12/22/2021   History of Present Illness Pt is a 76 y.o. female s/p L TKA secondary to OA 12/20/21.  PMH includes htn, B bunions, chronic ulcerative proctitis, shoulder impingement, insomnia.  Pt reports h/o fainting.    PT Comments    Pt was long sitting in bed upon arriving. Endorses 7/10 pain that quickly elevated to 9/10 pain with movements. Pt remains extremely cooperative even with c/o severe pain. She elected not to ambulate this session but was agreeable to performing there ex and stretching. Pt performed several repetitions of knee flexion at EOB. Was able to flex knee to 88 degrees after performing 5 reps of knee flexion. Less range of motion than previous date due to pain. She continues to lack full terminal knee extension by ~ 5 degrees. Knee immobilizer reapplied at conclusion of performance of there ex. See exercises performed listed below. Author highly recommends DC to SNF once medically stable. She will benefit from continued skilled PT at DC to address deficits while maximizing independence with all ADLs.    Recommendations for follow up therapy are one component of a multi-disciplinary discharge planning process, led by the attending physician.  Recommendations may be updated based on patient status, additional functional criteria and insurance authorization.  Follow Up Recommendations  Skilled nursing-short term rehab (<3 hours/day)     Assistance Recommended at Discharge Intermittent Supervision/Assistance  Patient can return home with the following A little help with walking and/or transfers;Assistance with cooking/housework;Assist for transportation;A little help with bathing/dressing/bathroom   Equipment Recommendations  Rolling walker (2 wheels);BSC/3in1       Precautions / Restrictions Precautions Precautions: Knee;Fall Precaution Booklet Issued: Yes  (comment) Precaution Comments: H/o fainting Required Braces or Orthoses: Knee Immobilizer - Left Knee Immobilizer - Left: On at all times;Other (comment) (except with PT/OT) Restrictions Weight Bearing Restrictions: Yes LLE Weight Bearing: Weight bearing as tolerated Other Position/Activity Restrictions: Wears special insert for shoe for ambulation     Mobility  Bed Mobility Overal bed mobility: Needs Assistance Bed Mobility: Supine to Sit, Sit to Supine     Supine to sit: HOB elevated, Min assist Sit to supine: Min assist   General bed mobility comments: Pt was able to exit left side of bed. Increased time required due to pain but was able to achieve. Due to severe pain, elected not to stand and ambulate but she was agreeable to performing ther ex and stretching at bedside.    Transfers Overall transfer level: Needs assistance Equipment used: Rolling walker (2 wheels) Transfers: Sit to/from Stand Sit to Stand: Min guard, Min assist           General transfer comment: CGA-min assist for stabilizing    Ambulation/Gait Ambulation/Gait assistance: Supervision Gait Distance (Feet): 25 Feet Assistive device: Rolling walker (2 wheels) Gait Pattern/deviations: Step-to pattern, Antalgic Gait velocity: decreased     General Gait Details: defferred due to pain     Balance Overall balance assessment: Needs assistance Sitting-balance support: No upper extremity supported, Feet supported Sitting balance-Leahy Scale: Good     Standing balance support: Bilateral upper extremity supported, During functional activity Standing balance-Leahy Scale: Good      Cognition Arousal/Alertness: Awake/alert Behavior During Therapy: WFL for tasks assessed/performed Overall Cognitive Status: Within Functional Limits for tasks assessed      General Comments: Pt is A and O x 4.        Exercises Total Joint Exercises  Ankle Circles/Pumps: AROM, Strengthening, Both, 10 reps,  Supine Quad Sets: AROM, Strengthening, Both, 10 reps, Supine Gluteal Sets: 10 reps Heel Slides: AROM, 10 reps Hip ABduction/ADduction: AAROM, Strengthening, Left, 10 reps, Supine Straight Leg Raises: AROM, Strengthening, Left, 10 reps, Supine Goniometric ROM: 5-88        Pertinent Vitals/Pain Pain Assessment Pain Assessment: 0-10 Pain Score: 7  Pain Location: L knee Pain Descriptors / Indicators: Grimacing, Guarding, Tender Pain Intervention(s): Limited activity within patient's tolerance, Monitored during session, Premedicated before session, Repositioned, Ice applied     PT Goals (current goals can now be found in the care plan section) Acute Rehab PT Goals Patient Stated Goal: get better and go home Progress towards PT goals: Progressing toward goals    Frequency    BID      PT Plan Current plan remains appropriate       AM-PAC PT "6 Clicks" Mobility   Outcome Measure  Help needed turning from your back to your side while in a flat bed without using bedrails?: None Help needed moving from lying on your back to sitting on the side of a flat bed without using bedrails?: A Little Help needed moving to and from a bed to a chair (including a wheelchair)?: A Little Help needed standing up from a chair using your arms (e.g., wheelchair or bedside chair)?: A Little Help needed to walk in hospital room?: A Little Help needed climbing 3-5 steps with a railing? : A Lot 6 Click Score: 18    End of Session   Activity Tolerance: Patient limited by pain Patient left: in bed;with call bell/phone within reach;with bed alarm set;with family/visitor present (daughter present at conclusion of session) Nurse Communication: Mobility status;Precautions;Weight bearing status PT Visit Diagnosis: Other abnormalities of gait and mobility (R26.89);Muscle weakness (generalized) (M62.81);Pain Pain - Right/Left: Left Pain - part of body: Knee     Time: 5449-2010 PT Time Calculation  (min) (ACUTE ONLY): 20 min  Charges:  $Therapeutic Exercise: 8-22 mins                    Julaine Fusi PTA 12/22/21, 4:47 PM

## 2021-12-22 NOTE — Progress Notes (Signed)
°  Subjective:  POD #2 s/p left TKA.   Patient reports left knee pain as moderate currently but was severe earlier at 9 out of 10.  Patient's pain increases significantly with attempted ambulation during physical therapy.  Patient's daughter is at the bedside.  I discussed this case with the patient's therapist, Julaine Fusi.  Objective:   VITALS:   Vitals:   12/21/21 1946 12/22/21 0330 12/22/21 0748 12/22/21 1011  BP: (!) 117/52 (!) 101/56 (!) 120/43 (!) 128/54  Pulse: 80 83 82   Resp: 16 16 16    Temp: 98.3 F (36.8 C) 98.8 F (37.1 C) 99.3 F (37.4 C)   TempSrc:      SpO2: 99% 92% 94%   Weight:      Height:        PHYSICAL EXAM: Left lower extremity: Patient wearing pants.  She is up out of bed to a chair.  Left knee immobilizer and Polar Care in place. Neurovascular intact Dorsiflexion/Plantar flexion intact   LABS  Results for orders placed or performed during the hospital encounter of 12/20/21 (from the past 24 hour(s))  SARS CORONAVIRUS 2 (TAT 6-24 HRS) Nasopharyngeal Nasopharyngeal Swab     Status: None   Collection Time: 12/21/21  5:00 PM   Specimen: Nasopharyngeal Swab  Result Value Ref Range   SARS Coronavirus 2 NEGATIVE NEGATIVE  CBC     Status: Abnormal   Collection Time: 12/22/21  6:13 AM  Result Value Ref Range   WBC 9.4 4.0 - 10.5 K/uL   RBC 3.33 (L) 3.87 - 5.11 MIL/uL   Hemoglobin 10.1 (L) 12.0 - 15.0 g/dL   HCT 30.0 (L) 36.0 - 46.0 %   MCV 90.1 80.0 - 100.0 fL   MCH 30.3 26.0 - 34.0 pg   MCHC 33.7 30.0 - 36.0 g/dL   RDW 15.1 11.5 - 15.5 %   Platelets 191 150 - 400 K/uL   nRBC 0.0 0.0 - 0.2 %    No results found.  Assessment/Plan: 2 Days Post-Op   Principal Problem:   S/P TKR (total knee replacement) using cement, left  Patient having significant pain postop.  Patient will require further hospitalization to adequately manage her pain before she can be discharged to [facility] skilled nursing facility where she is a resident.  We will  continue to elevate the left lower extremity and use her Polar Care to reduce swelling.  She will adjust her weightbearing while using a walker as her pain requires.  Patient will stay on a consistent course of oxycodone and Tylenol for pain management.  Continue Lovenox for DVT prophylaxis.    Thornton Park , MD 12/22/2021, 2:42 PM

## 2021-12-22 NOTE — Progress Notes (Signed)
Occupational Therapy Treatment °Patient Details °Name: Barbara Burgess °MRN: [id number] °DOB: 04/08/1946 °Today's Date: 12/22/2021 ° ° °History of present illness Pt is a 76 y.o. female s/p L TKA secondary to OA 12/20/21.  PMH includes htn, B bunions, chronic ulcerative proctitis, shoulder impingement, insomnia.  Pt reports h/o fainting. °  °OT comments ° Pt seen for OT tx this date to f/u re: safety with ADLs/ADL mobility. Pt up to recliner pre/post session. She requires MOD A to manage KI and polar care. She is able to don/doff L socks/shoes with MIN A and educated on task modification. OT engages pt in STS from recliner with CGA/SUPV with increased time and use of RW. She requires MIN A/CGA for transfer to/from commode in rest room and one cue for use of grab bar. Pt able to perform fxl mobliity in room with RW with CGA, but does report that her pain increases from 5 to 8/10 with fxl mobility. She is returned to recliner with B LE elevated and towel roll under L LE to promote extension at end of session. All needs met and in reach. Will continue to follow acutely. Continue to anticipate pt will benefit from f/u OT services in STR setting.   ° °Recommendations for follow up therapy are one component of a multi-disciplinary discharge planning process, led by the attending physician.  Recommendations may be updated based on patient status, additional functional criteria and insurance authorization. °   °Follow Up Recommendations ° Skilled nursing-short term rehab (<3 hours/day)  °  °Assistance Recommended at Discharge Set up Supervision/Assistance  °Patient can return home with the following ° A little help with bathing/dressing/bathroom;Assistance with cooking/housework;Assist for transportation;Help with stairs or ramp for entrance °  °Equipment Recommendations ° BSC/3in1;Tub/shower seat;Other (comment) (2ww)  °  °Recommendations for Other Services   ° °  °Precautions / Restrictions Precautions °Precautions:  Knee;Fall °Precaution Booklet Issued: Yes (comment) °Precaution Comments: H/o fainting °Required Braces or Orthoses: Knee Immobilizer - Left °Knee Immobilizer - Left: On at all times;Other (comment) (except with THERAPY) °Restrictions °Weight Bearing Restrictions: Yes °LLE Weight Bearing: Weight bearing as tolerated °Other Position/Activity Restrictions: Wears special insert for shoe for ambulation  ° ° °  ° °Mobility Bed Mobility °  °  °  °  °  °  °  °General bed mobility comments: up to chair pre/post °  ° °Transfers °Overall transfer level: Needs assistance °Equipment used: Rolling walker (2 wheels) °Transfers: Sit to/from Stand, Bed to chair/wheelchair/BSC °Sit to Stand: Min guard, Min assist °Stand pivot transfers: Supervision, Min guard °  °  °  °  °  °  °  °Balance Overall balance assessment: Needs assistance °Sitting-balance support: No upper extremity supported, Feet supported °Sitting balance-Leahy Scale: Good °  °  °Standing balance support: Bilateral upper extremity supported, During functional activity °Standing balance-Leahy Scale: Good °  °  °  °  °  °  °  °  °  °  °  °  °   ° °ADL either performed or assessed with clinical judgement  ° °ADL Overall ADL's : Needs assistance/impaired °  °  °Grooming: Set up;Sitting °Grooming Details (indicate cue type and reason): hand hygiene after toileting °  °  °  °  °  °  °Lower Body Dressing: Minimal assistance;Sitting/lateral leans °Lower Body Dressing Details (indicate cue type and reason): socks/shoes °Toilet Transfer: Min guard;Supervision/safety;Ambulation;Rolling walker (2 wheels);Grab bars;BSC/3in1 °Toilet Transfer Details (indicate cue type and reason): BSC to elevate °Toileting- Clothing Manipulation   and Hygiene: Min guard;Supervision/safety °  °  °  °Functional mobility during ADLs: Min guard;Supervision/safety;Rolling walker (2 wheels) (to/from restroom) °  °  ° °Extremity/Trunk Assessment   °  °  °  °  °  ° °Vision   °  °  °Perception   °  °Praxis   °   ° °Cognition Arousal/Alertness: Awake/alert °Behavior During Therapy: WFL for tasks assessed/performed °Overall Cognitive Status: Within Functional Limits for tasks assessed °  °  °  °  °  °  °  °  °  °  °  °  °  °  °  °  °General Comments: Pt is A and O x 4. °  °  °   °Exercises Other Exercises °Other Exercises: OT ed with pt and dtr re: LB ADL task modification ° °  °Shoulder Instructions   ° ° °  °General Comments Reviewed expectations with PT going forward. Encouraged use of polar care at all times not up and moving. She was severely limited by pain overall. Elected not to  perform more activity due to pain.  ° ° °Pertinent Vitals/ Pain       Pain Assessment °Pain Assessment: 0-10 °Pain Score: 7  °Pain Location: L knee °Pain Descriptors / Indicators: Grimacing, Guarding, Tender °Pain Intervention(s): Limited activity within patient's tolerance, Monitored during session, Premedicated before session ° °Home Living   °  °  °  °  °  °  °  °  °  °  °  °  °  °  °  °  °  °  ° °  °Prior Functioning/Environment    °  °  °  °   ° °Frequency ° Min 2X/week  ° ° ° ° °  °Progress Toward Goals ° °OT Goals(current goals can now be found in the care plan section) ° Progress towards OT goals: Progressing toward goals ° °Acute Rehab OT Goals °Patient Stated Goal: to go home °OT Goal Formulation: With patient °Time For Goal Achievement: 01/04/22 °Potential to Achieve Goals: Good  °Plan Discharge plan remains appropriate   ° °Co-evaluation ° ° °   °  °  °  °  ° °  °AM-PAC OT "6 Clicks" Daily Activity     °Outcome Measure ° ° Help from another person eating meals?: None °Help from another person taking care of personal grooming?: A Little °Help from another person toileting, which includes using toliet, bedpan, or urinal?: A Little °Help from another person bathing (including washing, rinsing, drying)?: A Lot °Help from another person to put on and taking off regular upper body clothing?: A Little °Help from another person to put on  and taking off regular lower body clothing?: A Little °6 Click Score: 18 ° °  °End of Session Equipment Utilized During Treatment: Rolling walker (2 wheels) ° °OT Visit Diagnosis: Unsteadiness on feet (R26.81);History of falling (Z91.81) °  °Activity Tolerance Patient tolerated treatment well °  °Patient Left in bed;with call bell/phone within reach;with bed alarm set;with family/visitor present °  °Nurse Communication Mobility status °  ° °   ° °Time: 1146-1209 °OT Time Calculation (min): 23 min ° °Charges: OT General Charges °$OT Visit: 1 Visit °OT Treatments °$Self Care/Home Management : 8-22 mins °$Therapeutic Activity: 8-22 mins ° ° , MS, OTR/L °ascom [phone] °12/22/21, 3:47 PM  °

## 2021-12-22 NOTE — Progress Notes (Signed)
Physical Therapy Treatment Patient Details Name: Barbara Burgess MRN: [id number] DOB: 06-03-1946 Today's Date: 12/22/2021   History of Present Illness Pt is a 76 y.o. female s/p L TKA secondary to OA 12/20/21.  PMH includes htn, B bunions, chronic ulcerative proctitis, shoulder impingement, insomnia.  Pt reports h/o fainting.    PT Comments    Pt was long sitting in bed prior to session. She has been pre-medicated prior to session and was agreeable however was limited by pain throughout. She required min assist to exit L side of bed. Stood to [employer] and ambulated to doorway prior to requesting to return to recliner. Knee immobilizer, polar care, towel roll in place post session. OT arrived as Pryor Curia was leaving room. Pt continues to need rehab at DC to address deficits while maximizing independence with ADLs.   Recommendations for follow up therapy are one component of a multi-disciplinary discharge planning process, led by the attending physician.  Recommendations may be updated based on patient status, additional functional criteria and insurance authorization.  Follow Up Recommendations  Skilled nursing-short term rehab (<3 hours/day)     Assistance Recommended at Discharge Intermittent Supervision/Assistance  Patient can return home with the following A little help with walking and/or transfers;Assistance with cooking/housework;Assist for transportation;A little help with bathing/dressing/bathroom   Equipment Recommendations  Rolling walker (2 wheels);BSC/3in1       Precautions / Restrictions Precautions Precautions: Knee;Fall Precaution Booklet Issued: Yes (comment) Precaution Comments: H/o fainting Required Braces or Orthoses: Knee Immobilizer - Left Knee Immobilizer - Left: On at all times;Other (comment) (except with PT) Restrictions Weight Bearing Restrictions: Yes LLE Weight Bearing: Weight bearing as tolerated     Mobility  Bed Mobility Overal bed mobility: Needs  Assistance Bed Mobility: Supine to Sit, Sit to Supine     Supine to sit: HOB elevated, Min assist     General bed mobility comments: Pt was able to exit bed with min assist more so due to pain than strength.    Transfers Overall transfer level: Needs assistance Equipment used: Rolling walker (2 wheels) Transfers: Sit to/from Stand Sit to Stand: Min guard, Min assist     General transfer comment: CGA-min assist for stabilizing    Ambulation/Gait Ambulation/Gait assistance: Supervision Gait Distance (Feet): 25 Feet Assistive device: Rolling walker (2 wheels) Gait Pattern/deviations: Step-to pattern, Antalgic Gait velocity: decreased     General Gait Details: pt was able to ambulate 25 ft with RW with slow abntalgic step to pattern.     Balance Overall balance assessment: Needs assistance Sitting-balance support: No upper extremity supported, Feet supported Sitting balance-Leahy Scale: Good     Standing balance support: Bilateral upper extremity supported, During functional activity Standing balance-Leahy Scale: Good      Cognition Arousal/Alertness: Awake/alert Behavior During Therapy: WFL for tasks assessed/performed Overall Cognitive Status: Within Functional Limits for tasks assessed      General Comments: Pt is A and O x 4.           General Comments General comments (skin integrity, edema, etc.): Reviewed expectations with PT going forward. Encouraged use of polar care at all times not up and moving. She was severely limited by pain overall. Elected not to  perform more activity due to pain.      Pertinent Vitals/Pain Pain Assessment Pain Assessment: 0-10 Pain Score: 7  Pain Location: knee Pain Descriptors / Indicators: Grimacing, Guarding, Tender Pain Intervention(s): Limited activity within patient's tolerance, Monitored during session, Premedicated before session, Repositioned, Ice applied  PT Goals (current goals can now be found in the care  plan section) Acute Rehab PT Goals Patient Stated Goal: get better and go home Progress towards PT goals: Progressing toward goals    Frequency    BID      PT Plan Current plan remains appropriate       AM-PAC PT "6 Clicks" Mobility   Outcome Measure  Help needed turning from your back to your side while in a flat bed without using bedrails?: None Help needed moving from lying on your back to sitting on the side of a flat bed without using bedrails?: A Little Help needed moving to and from a bed to a chair (including a wheelchair)?: A Little Help needed standing up from a chair using your arms (e.g., wheelchair or bedside chair)?: A Little Help needed to walk in hospital room?: A Little Help needed climbing 3-5 steps with a railing? : A Lot 6 Click Score: 18    End of Session   Activity Tolerance: Patient limited by pain Patient left: in chair;with call bell/phone within reach;with chair alarm set;with family/visitor present;with SCD's reapplied;Other (comment) Nurse Communication: Mobility status;Precautions;Weight bearing status PT Visit Diagnosis: Other abnormalities of gait and mobility (R26.89);Muscle weakness (generalized) (M62.81);Pain Pain - Right/Left: Left Pain - part of body: Knee     Time: 1125-1145 PT Time Calculation (min) (ACUTE ONLY): 20 min  Charges:  $Therapeutic Activity: 8-22 mins                     Julaine Fusi PTA 12/22/21, 1:20 PM

## 2021-12-23 DIAGNOSIS — M1712 Unilateral primary osteoarthritis, left knee: Secondary | ICD-10-CM | POA: Diagnosis not present

## 2021-12-23 MED ORDER — OXYCODONE HCL 5 MG PO TABS
5.0000 mg | ORAL_TABLET | ORAL | 0 refills | Status: DC | PRN
Start: 2021-12-23 — End: 2022-12-25

## 2021-12-23 MED ORDER — ENOXAPARIN SODIUM 40 MG/0.4ML IJ SOSY: 40.0000 mg | PREFILLED_SYRINGE | INTRAMUSCULAR | 0 refills | Status: DC

## 2021-12-23 NOTE — Progress Notes (Signed)
°  Subjective:  POD #3 s/p left total knee arthroplasty.   Patient reports left knee pain as mild to moderate.    Objective:   VITALS:   Vitals:   12/23/21 0005 12/23/21 0419 12/23/21 0817 12/23/21 1116  BP: (!) 113/29 (!) 124/46 (!) 128/42 116/61  Pulse: 83 84 81 80  Resp: 18 16 18 18   Temp: 99.9 F (37.7 C) 98.4 F (36.9 C) 98.7 F (37.1 C) 97.9 F (36.6 C)  TempSrc:      SpO2: 96% 96% 96% 98%  Weight:      Height:        PHYSICAL EXAM: Left lower extremity: I personally change the patient's dressing today.  She had mild amount of sanguinous drainage on her bandage from surgery.  No active bleeding is seen from her incision.  There is no erythema of her left knee but a small amount of ecchymosis.  There is no significant swelling or hemarthrosis.  Patient is neurovascular intact. Neurovascular intact Sensation intact distally Intact pulses distally Dorsiflexion/Plantar flexion intact No cellulitis present Compartment soft  LABS  No results found for this or any previous visit (from the past 24 hour(s)).  No results found.  Assessment/Plan: 3 Days Post-Op   Principal Problem:   S/P TKR (total knee replacement) using cement, left  Patient is doing well postop.  Her pain is better controlled today.  She will be discharged to [facility] rehab today.  Patient will continue Lovenox for DVT prophylaxis.  She will continue on oxycodone for pain.    Thornton Park , MD 12/23/2021, 2:04 PM

## 2021-12-23 NOTE — Progress Notes (Signed)
Report called to RN at [facility]

## 2021-12-23 NOTE — Discharge Summary (Signed)
Physician Discharge Summary  Patient ID: Barbara Burgess MRN: [id number] DOB/AGE: 76-Nov-1947 76 y.o.  Admit date: 12/20/2021 Discharge date: 12/23/2021  Admission Diagnoses:  Left Knee Fracture S/P TKR (total knee replacement) using cement, left  Discharge Diagnoses:  Left Knee Fracture Principal Problem:   S/P TKR (total knee replacement) using cement, left   Past Medical History:  Diagnosis Date   Arthritis    knee and hip,  per [facility] New Patient Packet.    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    Colitis    H/O CT scan of chest 11/27/2018   By Berle Mull, per [facility] New Patient Packet.    H/O mammogram 11/27/2018   By Arvilla Market at [facility], per [facility] New Patient Packet.    History of CT scan of abdomen 11/27/2018   By Berle Mull, per [facility] New Patient Packet.    History of upper extremity x-ray 11/27/2018   By Bridgette Habermann, of Shoulder, per [facility] New Patient Packet.    History of x-ray of hip 11/27/2018   By Bridgette Habermann, of Hip, per [facility] New Patient Packet.    Hyperlipemia     per [facility] New Patient Packet.    Hypertension     per [facility] New Patient Packet.    Impingement syndrome of shoulder     per [facility] New Patient Packet.    Incontinence    Mild Nighttime per [facility] New Patient Packet.   Insomnia    Melanoma ([place])    right lower leg; right ovary   Osteoarthritis of multiple joints    Rosacea     per [facility] New Patient Packet.    Sleep disorder     Surgeries: Procedure(s): TOTAL KNEE ARTHROPLASTY on 12/20/2021   Consultants (if any): Treatment Team:  Thornton Park, MD  Discharged Condition: Improved  Hospital Course: Barbara Burgess is an 76 y.o. female who was admitted 12/20/2021 with a diagnosis of  Left Knee Fracture S/P TKR (total knee replacement) using cement, left and went to the operating room on 12/20/2021 and underwent an uncomplicated left total knee arthroplasty.    She was given  perioperative antibiotics:  Anti-infectives (From admission, onward)    Start     Dose/Rate Route Frequency Ordered Stop   12/20/21 1400  ceFAZolin (ANCEF) IVPB 2g/100 mL premix        2 g 200 mL/hr over 30 Minutes Intravenous Every 6 hours 12/20/21 1136 12/20/21 2150   12/20/21 0619  clindamycin (CLEOCIN) 600 MG/50ML IVPB       Note to Pharmacy: Doreen Salvage J: cabinet override      12/20/21 0619 12/20/21 0817   12/20/21 0619  ceFAZolin (ANCEF) 2-4 GM/100ML-% IVPB       Note to Pharmacy: Doreen Salvage J: cabinet override      12/20/21 0619 12/20/21 0817   12/20/21 0600  ceFAZolin (ANCEF) IVPB 2g/100 mL premix        2 g 200 mL/hr over 30 Minutes Intravenous On call to O.R. 12/20/21 0220 12/20/21 0804   12/20/21 0230  clindamycin (CLEOCIN) IVPB 600 mg        600 mg 100 mL/hr over 30 Minutes Intravenous  Once 12/20/21 0220 12/20/21 0804     .  She was given sequential compression devices, early ambulation, and Lovenox for DVT prophylaxis.  Patient was admitted postoperatively for pain control.  Patient began physical therapy on postop day #1 and continued  throughout her hospitalization.  Her hemoglobin and hematocrit remained stable throughout her hospitalization.  Given her clinical improvement she is prepared for discharge on postop day #3.  She benefited maximally from the hospital stay and there were no complications.    Recent vital signs:  Vitals:   12/23/21 0817 12/23/21 1116  BP: (!) 128/42 116/61  Pulse: 81 80  Resp: 18 18  Temp: 98.7 F (37.1 C) 97.9 F (36.6 C)  SpO2: 96% 98%    Recent laboratory studies:  Lab Results  Component Value Date   HGB 10.1 (L) 12/22/2021   HGB 11.4 (L) 12/21/2021   HGB 13.3 12/20/2021   Lab Results  Component Value Date   WBC 9.4 12/22/2021   PLT 191 12/22/2021   Lab Results  Component Value Date   INR 0.9 12/09/2021   Lab Results  Component Value Date   NA 138 12/21/2021   K 4.7 12/21/2021   CL 108 12/21/2021   CO2  24 12/21/2021   BUN 14 12/21/2021   CREATININE 0.66 12/21/2021   GLUCOSE 157 (H) 12/21/2021    Discharge Medications:   Allergies as of 12/23/2021       Reactions   Atorvastatin    Elevated Liver Enzymes.         Medication List     STOP taking these medications    Turmeric 500 MG Caps       TAKE these medications    amLODipine 5 MG tablet Commonly known as: NORVASC Take 1 tablet (5 mg total) by mouth daily.   azelastine 0.1 % nasal spray Commonly known as: ASTELIN Place 2 sprays into both nostrils daily.   CALCIUM CARBONATE-VITAMIN D PO Take 2 tablets by mouth daily.   enoxaparin 40 MG/0.4ML injection Commonly known as: LOVENOX Inject 0.4 mLs (40 mg total) into the skin daily. Start taking on: December 24, 2021   fluticasone 50 MCG/ACT nasal spray Commonly known as: FLONASE Place 1 spray into both nostrils every evening.   Gelatin 600 MG Caps Take 600 mg by mouth 2 (two) times daily with a meal.   mesalamine 1.2 g EC tablet Commonly known as: LIALDA TAKE 4 TABLETS (4.8 G TOTAL) DAILY. What changed: See the new instructions.   metroNIDAZOLE 0.75 % cream Commonly known as: METROCREAM Apply 1 application topically daily.   naproxen sodium 220 MG tablet Commonly known as: ALEVE Take 220 mg by mouth daily as needed (knee pain).   oxyCODONE 5 MG immediate release tablet Commonly known as: Oxy IR/ROXICODONE Take 1-2 tablets (5-10 mg total) by mouth every 4 (four) hours as needed for moderate pain.   rosuvastatin 10 MG tablet Commonly known as: Crestor Take 1 tablet (10 mg total) by mouth daily. What changed: when to take this   traZODone 100 MG tablet Commonly known as: DESYREL Take 1 tablet (100 mg total) by mouth at bedtime.        Diagnostic Studies: DG Knee Left Port  Result Date: 12/20/2021 CLINICAL DATA:  Left total knee arthroplasty EXAM: PORTABLE LEFT KNEE - 1-2 VIEW COMPARISON:  None. FINDINGS: Postsurgical changes from left total  knee arthroplasty. Arthroplasty components are in their expected alignment. No periprosthetic fracture or evidence of other complication. 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 Expected postoperative changes within the overlying soft tissues. IMPRESSION: Expected postoperative appearance status post left total knee arthroplasty. Electronically Signed   By: Davina Poke D.O.   On: 12/20/2021 11:54  Disposition: Discharge disposition: 01-Home or Self Care       Discharge Instructions     Call MD / Call 911   Complete by: As directed    If you experience chest pain or shortness of breath, CALL 911 and be transported to the hospital emergency room.  If you develope a fever above 101 F, pus (white drainage) or increased drainage or redness at the wound, or calf pain, call your surgeon's office.   Constipation Prevention   Complete by: As directed    Drink plenty of fluids.  Prune juice may be helpful.  You may use a stool softener, such as Colace (over the counter) 100 mg twice a day.  Use MiraLax (over the counter) for constipation as needed.   Diet general   Complete by: As directed    Discharge instructions   Complete by: As directed    The patient may continue to bear weight on the left lower extremity with use of a walker.  Patient will remove the left knee Ace wrap and begin compression hose on Monday morning on the left lower extremity.  The patient should continue to use TED stockings until follow-up. Patient should remove the TED stockings at night for sleep. The patient needs to continue to elevate the left lower extremity whenever possible. The knee immobilizer should be used at night. The patient may remove the knee immobilizer to perform exercises or sit in a chair during the day.  Patient should not place a pillow under their knee. The Polar Care may be used by the patient for comfort.  The  dressing should remain on until follow up in the office.   The patient must cover the left knee dressing/incision during showers with a plastic bag or Saran wrap.  The patient will take Lovenox 40 mg injection once a day for blood clot prevention and continue to work on knee range of motion exercises at home as instructed by physical therapy until follow-up in the office.   Driving restrictions   Complete by: As directed    No driving for 4-6 weeks   Increase activity slowly as tolerated   Complete by: As directed    Lifting restrictions   Complete by: As directed    No lifting for 12-16 weeks   Post-operative opioid taper instructions:   Complete by: As directed    POST-OPERATIVE OPIOID TAPER INSTRUCTIONS: It is important to wean off of your opioid medication as soon as possible. If you do not need pain medication after your surgery it is ok to stop day one. Opioids include: Codeine, Hydrocodone(Norco, Vicodin), Oxycodone(Percocet, oxycontin) and hydromorphone amongst others.  Long term and even short term use of opiods can cause: Increased pain response Dependence Constipation Depression Respiratory depression And more.  Withdrawal symptoms can include Flu like symptoms Nausea, vomiting And more Techniques to manage these symptoms Hydrate well Eat regular healthy meals Stay active Use relaxation techniques(deep breathing, meditating, yoga) Do Not substitute Alcohol to help with tapering If you have been on opioids for less than two weeks and do not have pain than it is ok to stop all together.  Plan to wean off of opioids This plan should start within one week post op of your joint replacement. Maintain the same interval or time between taking each dose and first decrease the dose.  Cut the total daily intake of opioids by one tablet each day Next start to increase the time between doses. The last dose that should  be eliminated is the evening dose.           Contact  information for after-discharge care     Destination     [facility] .   Service: Skilled Nursing Contact information: [place] [facility] [phone]                      Signed: Thornton Park ,MD 12/23/2021, 2:15 PM

## 2021-12-23 NOTE — Progress Notes (Signed)
Physical Therapy Treatment Patient Details Name: Barbara Burgess MRN: [id number] DOB: 1946/08/09 Today's Date: 12/23/2021   History of Present Illness Pt is a 76 y.o. female s/p L TKA secondary to OA 12/20/21.  PMH includes htn, B bunions, chronic ulcerative proctitis, shoulder impingement, insomnia.  Pt reports h/o fainting.    PT Comments    Pt was in recliner upon arriving. She just finished OT and overall states she is feeling better. She still is endorse 7/10 pain however pain did not limit session as much today. Pt was able to stand to RW and ambulate ~200 ft without LOB or safety concern. She tolerated performance of ther ex after ambulation well. Continues to lack ~ 3 degrees of terminal knee extension but has great active knee flexion (~96 degrees). She was repositioned in recliner post session with polar care reapplied, knee immobilizer in place, and towel roll under heel to promote increased knee extension. SNF at [facility] still most appropriate since pt lives alone and is not at baseline. Will benefit from continued skilled PT at [facility] to assist pt with returning to PLOF.   Recommendations for follow up therapy are one component of a multi-disciplinary discharge planning process, led by the attending physician.  Recommendations may be updated based on patient status, additional functional criteria and insurance authorization.  Follow Up Recommendations  Skilled nursing-short term rehab (<3 hours/day)     Assistance Recommended at Discharge Intermittent Supervision/Assistance  Patient can return home with the following A little help with walking and/or transfers;Assistance with cooking/housework;Assist for transportation;A little help with bathing/dressing/bathroom   Equipment Recommendations  Rolling walker (2 wheels);BSC/3in1       Precautions / Restrictions Precautions Precautions: Knee;Fall Precaution Booklet Issued: Yes (comment) Precaution Comments: H/o fainting Required Braces or  Orthoses: Knee Immobilizer - Left Knee Immobilizer - Left: On at all times;Other (comment) (except with PT/OT) Restrictions Weight Bearing Restrictions: Yes LLE Weight Bearing: Weight bearing as tolerated     Mobility  Bed Mobility    General bed mobility comments: Pt was in recliner pre/post session    Transfers Overall transfer level: Needs assistance Equipment used: Rolling walker (2 wheels) Transfers: Sit to/from Stand Sit to Stand: Min guard      General transfer comment: CGA for safety    Ambulation/Gait Ambulation/Gait assistance: Supervision Gait Distance (Feet): 200 Feet Assistive device: Rolling walker (2 wheels) Gait Pattern/deviations: Step-to pattern, Antalgic Gait velocity: decreased     General Gait Details: Pt tolerated ambulation 200 ft without LOB or safety concern     Balance Overall balance assessment: Needs assistance Sitting-balance support: No upper extremity supported, Feet supported Sitting balance-Leahy Scale: Good     Standing balance support: Bilateral upper extremity supported, During functional activity Standing balance-Leahy Scale: Good       Cognition Arousal/Alertness: Awake/alert Behavior During Therapy: WFL for tasks assessed/performed Overall Cognitive Status: Within Functional Limits for tasks assessed    General Comments: Pt is A and O x 4.           General Comments General comments (skin integrity, edema, etc.): pt performed several exercises in recliner to promote improved knee extension. Pt is progressing well with knee flexion however is struggling to get to 0 degrees extension. Overall was able to demonstrate increase extension but lacking ~ 3 degrees.      Pertinent Vitals/Pain Pain Assessment Pain Assessment: 0-10 Pain Score: 7  Pain Location: L knee Pain Descriptors / Indicators: Guarding, Grimacing, Tender Pain Intervention(s): Limited activity within patient's tolerance,  Monitored during session,  Premedicated before session, Repositioned     PT Goals (current goals can now be found in the care plan section) Acute Rehab PT Goals Patient Stated Goal: get better and go home Progress towards PT goals: Progressing toward goals    Frequency    BID      PT Plan Current plan remains appropriate       AM-PAC PT "6 Clicks" Mobility   Outcome Measure  Help needed turning from your back to your side while in a flat bed without using bedrails?: None Help needed moving from lying on your back to sitting on the side of a flat bed without using bedrails?: A Little Help needed moving to and from a bed to a chair (including a wheelchair)?: A Little Help needed standing up from a chair using your arms (e.g., wheelchair or bedside chair)?: A Little Help needed to walk in hospital room?: A Little Help needed climbing 3-5 steps with a railing? : A Little 6 Click Score: 19    End of Session Equipment Utilized During Treatment: Gait belt Activity Tolerance: Patient limited by pain;Patient tolerated treatment well Patient left: in chair;with call bell/phone within reach;with chair alarm set Nurse Communication: Mobility status;Precautions;Weight bearing status PT Visit Diagnosis: Other abnormalities of gait and mobility (R26.89);Muscle weakness (generalized) (M62.81);Pain Pain - Right/Left: Left Pain - part of body: Knee     Time: 1112-1130 PT Time Calculation (min) (ACUTE ONLY): 18 min  Charges:  $Gait Training: 8-22 mins                     Julaine Fusi PTA 12/23/21, 12:49 PM

## 2021-12-23 NOTE — Progress Notes (Signed)
Occupational Therapy Treatment °Patient Details °Name: Barbara Burgess °MRN: [id number] °DOB: 10/18/1946 °Today's Date: 12/23/2021 ° ° °History of present illness Pt is a 75 y.o. female s/p L TKA secondary to OA 12/20/21.  PMH includes htn, B bunions, chronic ulcerative proctitis, shoulder impingement, insomnia.  Pt reports h/o fainting. °  °OT comments ° Pt seen for OT Tx this date to f/u re: safety with ADLs/ADL mobility. Pt agreeable to attempting bathing/dressing tasks this session. She generally is able to perform UB portions of task with SETUP in sitting, in standing, she requires MIN to MOD A for LB tasks including posterior LB bathing and pulling up compression stockings. Pt able to perform transfers and fxl mobility with RW with CGA this date. She returns to chair end of session with all needs met and in reach, awaiting PT session (coordinated with nursing for pain control, describes pain as 5-6/10 in sitting, 7-8/10 with mobilization/fxl mobility in the room). Will continue to follow acutely.   ° °Recommendations for follow up therapy are one component of a multi-disciplinary discharge planning process, led by the attending physician.  Recommendations may be updated based on patient status, additional functional criteria and insurance authorization. °   °Follow Up Recommendations ° Skilled nursing-short term rehab (<3 hours/day)  °  °Assistance Recommended at Discharge Set up Supervision/Assistance  °Patient can return home with the following ° A little help with bathing/dressing/bathroom;Assistance with cooking/housework;Assist for transportation;Help with stairs or ramp for entrance °  °Equipment Recommendations ° BSC/3in1;Tub/shower seat;Other (comment) (2ww)  °  °Recommendations for Other Services   ° °  °Precautions / Restrictions Precautions °Precautions: Knee;Fall °Precaution Booklet Issued: Yes (comment) °Precaution Comments: H/o fainting °Required Braces or Orthoses: Knee Immobilizer - Left °Knee  Immobilizer - Left: On at all times;Other (comment) (except with PT/OT) °Restrictions °Weight Bearing Restrictions: Yes °LLE Weight Bearing: Weight bearing as tolerated °Other Position/Activity Restrictions: Wears special insert for shoe for ambulation  ° ° °  ° °Mobility Bed Mobility °  °  °  °  °  °  °  °General bed mobility comments: Pt was in recliner pre/post session °  ° °Transfers °Overall transfer level: Needs assistance °Equipment used: Rolling walker (2 wheels) °Transfers: Sit to/from Stand °Sit to Stand: Min guard °Stand pivot transfers: Supervision, Min guard °  °  °  °  °General transfer comment: CGA for safety °  °  °Balance Overall balance assessment: Needs assistance °Sitting-balance support: No upper extremity supported, Feet supported °Sitting balance-Leahy Scale: Good °Sitting balance - Comments: G static sitting °  °Standing balance support: Bilateral upper extremity supported, During functional activity °Standing balance-Leahy Scale: Good °Standing balance comment: able to alternate UE support for static standing ADL tasks, B UE Support for fxl mobility °  °  °  °  °  °  °  °  °  °  °  °   ° °ADL either performed or assessed with clinical judgement  ° °ADL Overall ADL's : Needs assistance/impaired °  °  °Grooming: Wash/dry face;Set up;Sitting °  °Upper Body Bathing: Set up;Sitting °  °Lower Body Bathing: Moderate assistance;Sit to/from stand °  °Upper Body Dressing : Set up;Sitting °  °Lower Body Dressing: Minimal assistance;Moderate assistance °Lower Body Dressing Details (indicate cue type and reason): MIN A for pants/underwear and socks/shoes. MOD A for compression hose on R side °Toilet Transfer: Min guard;Supervision/safety;Ambulation;Rolling walker (2 wheels);Grab bars;BSC/3in1 °Toilet Transfer Details (indicate cue type and reason): BSC to elevate °Toileting- Clothing Manipulation and   Hygiene: Min guard;Supervision/safety;Sit to/from stand °  °  °  °Functional mobility during ADLs: Min  guard;Supervision/safety;Rolling walker (2 wheels) (to/from restroom) °  °  ° °Extremity/Trunk Assessment   °  °  °  °  °  ° °Vision   °  °  °Perception   °  °Praxis   °  ° °Cognition Arousal/Alertness: Awake/alert °Behavior During Therapy: WFL for tasks assessed/performed °Overall Cognitive Status: Within Functional Limits for tasks assessed °  °  °  °  °  °  °  °  °  °  °  °  °  °  °  °  °General Comments: Pt is A and O x 4. °  °  °   °Exercises Other Exercises °Other Exercises: OT engages pt in bathing/dressing/grooming tasks ° °  °Shoulder Instructions   ° ° °  °General Comments pt performed several exercises in recliner to promote improved knee extension. Pt is progressing well with knee flexion however is struggling to get to 0 degrees extension. Overall was able to demonstrate increase extension but lacking ~ 3 degrees.  ° ° °Pertinent Vitals/ Pain       Pain Assessment °Pain Assessment: 0-10 °Pain Score: 7  °Pain Location: L knee °Pain Descriptors / Indicators: Guarding, Grimacing, Tender °Pain Intervention(s): Limited activity within patient's tolerance, Monitored during session, Premedicated before session, Repositioned, Ice applied ° °Home Living   °  °  °  °  °  °  °  °  °  °  °  °  °  °  °  °  °  °  ° °  °Prior Functioning/Environment    °  °  °  °   ° °Frequency ° Min 2X/week  ° ° ° ° °  °Progress Toward Goals ° °OT Goals(current goals can now be found in the care plan section) ° Progress towards OT goals: Progressing toward goals ° °Acute Rehab OT Goals °Patient Stated Goal: to go home °OT Goal Formulation: With patient °Time For Goal Achievement: 01/04/22 °Potential to Achieve Goals: Good  °Plan Discharge plan remains appropriate   ° °Co-evaluation ° ° °   °  °  °  °  ° °  °AM-PAC OT "6 Clicks" Daily Activity     °Outcome Measure ° ° Help from another person eating meals?: None °Help from another person taking care of personal grooming?: A Little °Help from another person toileting, which includes  using toliet, bedpan, or urinal?: A Little °Help from another person bathing (including washing, rinsing, drying)?: A Lot °Help from another person to put on and taking off regular upper body clothing?: A Little °Help from another person to put on and taking off regular lower body clothing?: A Lot °6 Click Score: 17 ° °  °End of Session Equipment Utilized During Treatment: Rolling walker (2 wheels) ° °OT Visit Diagnosis: Unsteadiness on feet (R26.81);History of falling (Z91.81) °  °Activity Tolerance Patient tolerated treatment well °  °Patient Left in bed;with call bell/phone within reach;with bed alarm set;with family/visitor present °  °Nurse Communication Mobility status °  ° °   ° °Time: 1043-1109 °OT Time Calculation (min): 26 min ° °Charges: OT General Charges °$OT Visit: 1 Visit °OT Treatments °$Self Care/Home Management : 8-22 mins °$Therapeutic Activity: 8-22 mins ° ° , MS, OTR/L °ascom [phone] °12/23/21, 2:27 PM  °

## 2021-12-23 NOTE — Progress Notes (Signed)
°   12/23/21 1300  Clinical Encounter Type  Visited With Patient  Visit Type Follow-up;Spiritual support   Chaplain followed up on patient seen in pre-op noting that she was suppose to check out the following day. However, she needed to stay an extra 2 days. Patient is doing well and is due to check out today. Chaplain provided support through presence, conversation and prayer for patient's physical therapy journey.

## 2021-12-23 NOTE — Progress Notes (Signed)
DISCHARGE NOTE:  Patient discharged and wheeled out by staff nurse. Knee immobilizer intact. Patient given [facility] to take with her to [facility]. Patients daughter is transferring her to [place].

## 2021-12-23 NOTE — TOC Progression Note (Signed)
Transition of Care [facility]) - Progression Note    Patient Details  Name: Barbara Burgess MRN: [id number] Date of Birth: [birth year]  Transition of Care [facility]) CM/SW [place], RN Phone Number: 12/23/2021, 2:19 PM  Clinical Narrative:   Sent DC summary to [facility] and notified Seth Bake at [facility] The patient's family will transport    Expected Discharge Plan: [place] Barriers to Discharge: Continued Medical Work up  Expected Discharge Plan and Services Expected Discharge Plan: [place] Choice: [place] Living arrangements for the past 2 months: [place] Expected Discharge Date: 12/23/21                                     Social Determinants of Health (SDOH) Interventions    Readmission Risk Interventions No flowsheet data found.

## 2021-12-26 DIAGNOSIS — E785 Hyperlipidemia, unspecified: Secondary | ICD-10-CM

## 2021-12-26 DIAGNOSIS — I10 Essential (primary) hypertension: Secondary | ICD-10-CM | POA: Diagnosis not present

## 2021-12-26 DIAGNOSIS — M1712 Unilateral primary osteoarthritis, left knee: Secondary | ICD-10-CM | POA: Diagnosis not present

## 2021-12-26 DIAGNOSIS — K5190 Ulcerative colitis, unspecified, without complications: Secondary | ICD-10-CM | POA: Diagnosis not present

## 2021-12-26 DIAGNOSIS — G4700 Insomnia, unspecified: Secondary | ICD-10-CM | POA: Diagnosis not present

## 2021-12-29 DIAGNOSIS — M25562 Pain in left knee: Secondary | ICD-10-CM | POA: Diagnosis not present

## 2022-01-02 DIAGNOSIS — F1120 Opioid dependence, uncomplicated: Secondary | ICD-10-CM | POA: Diagnosis not present

## 2022-02-26 ENCOUNTER — Encounter: Payer: Self-pay | Admitting: Gastroenterology

## 2022-03-10 ENCOUNTER — Other Ambulatory Visit: Payer: Self-pay | Admitting: Gastroenterology

## 2022-07-17 ENCOUNTER — Ambulatory Visit: Payer: Self-pay | Admitting: Urology

## 2022-12-24 DIAGNOSIS — I890 Lymphedema, not elsewhere classified: Secondary | ICD-10-CM | POA: Insufficient documentation

## 2022-12-24 NOTE — Progress Notes (Signed)
MRN : [id number]  Barbara Burgess is a 77 y.o. ([birth year]) female who presents with chief complaint of legs swell.  History of Present Illness:   Patient is seen for evaluation of leg pain and leg swelling. The patient first noticed the swelling remotely. The swelling is associated with pain and discoloration. The pain and swelling worsens with prolonged dependency and improves with elevation. The pain is unrelated to activity.  The patient notes that in the morning the legs are improved but they steadily worsened throughout the course of the day. The patient also notes a steady worsening of the discoloration in the ankle and shin area.   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  This led to chemoradiation of the right groin and pelvic area.  She has been practicing manual lymphatic massage.  She has been wearing graduated compression thigh-high's but has found that they are cutting in at the top and becoming very uncomfortable.  The patient denies claudication symptoms.  The patient denies symptoms consistent with rest pain.  The patient denies and extensive history of DJD and LS spine disease.  The patient has no had any past angiography, interventions or vascular surgery.  Elevation makes the leg symptoms better, dependency makes them much worse. There is no history of ulcerations. The patient denies any recent changes in medications.  The patient denies a history of DVT or PE. There is no prior history of phlebitis. There is no history of primary lymphedema.  The patient denies amaurosis fugax or recent TIA symptoms. There are no recent neurological changes noted. The patient denies recent episodes of angina or shortness of breath  No outpatient medications have been marked as taking for the 12/25/22 encounter (Appointment) with Delana Meyer, Dolores Lory, MD.    Past Medical History:  Diagnosis Date    Arthritis    knee and hip,  per [facility] Patient Packet.    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    Colitis    H/O CT scan of chest 11/27/2018   By Berle Mull, per [facility] New Patient Packet.    H/O mammogram 11/27/2018   By Arvilla Market at [facility], per [facility] New Patient Packet.    History of CT scan of abdomen 11/27/2018   By Berle Mull, per [facility] Patient Packet.    History of upper extremity x-ray 11/27/2018   By Bridgette Habermann, of Shoulder, per [facility] New Patient Packet.    History of x-ray of hip 11/27/2018   By Bridgette Habermann, of Hip, per [facility] Patient Packet.    Hyperlipemia     per [facility] Patient Packet.    Hypertension     per [facility] Patient Packet.    Impingement syndrome of shoulder     per [facility] Patient Packet.    Incontinence    Mild Nighttime per [facility] New Patient Packet.   Insomnia    Melanoma ([place])    right lower leg; right ovary   Osteoarthritis of multiple joints    Rosacea     per [facility] Patient Packet.    Sleep disorder     Past Surgical History:  Procedure Laterality Date   ABDOMINAL HYSTERECTOMY  11/28/1987   Partial per [facility] Patient Packet   ARTHROSCOPIC REPAIR ACL Left 11/28/2003   Left Knee/ Torn Medial Meniscus  per [facility] New Patient Packet   BREAST BIOPSY     CATARACT EXTRACTION W/ INTRAOCULAR LENS IMPLANT Bilateral    COLONOSCOPY  11/27/2016   By Elspeth Cho,  per [facility] New Patient Packet.    COLONOSCOPY  11/27/2017   By Elspeth Cho, per [facility] New Patient Packet.    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   Procedure: TOTAL KNEE ARTHROPLASTY;  Surgeon: Thornton Park, MD;  Location: [facility] ORS;   Service: Orthopedics;  Laterality: Left;   TUBAL LIGATION  1976    Social History Social History   Tobacco Use   Smoking status: Former    Packs/day: 1.00    Types: Cigarettes    Quit date: 2006    Years since quitting: 18.0   Smokeless tobacco: Never  Vaping Use   Vaping Use: Never used  Substance Use Topics   Alcohol use: Yes    Comment: 2 a week   Drug use: Never    Family History Family History  Problem Relation Age of Onset   Alzheimer's disease Mother    Lung cancer Maternal Grandfather    Heart attack Paternal Grandfather    Diabetes Neg Hx     Allergies  Allergen Reactions   Atorvastatin     Elevated Liver Enzymes.      REVIEW OF SYSTEMS (Negative unless checked)  Constitutional: '[]'$ Weight loss  '[]'$ Fever  '[]'$ Chills Cardiac: '[]'$ Chest pain   '[]'$ Chest pressure   '[]'$ Palpitations   '[]'$ Shortness of breath when laying flat   '[]'$ Shortness of breath with exertion. Vascular:  '[]'$ Pain in legs with walking   '[x]'$ Pain in legs with standing  '[]'$ History of DVT   '[]'$ Phlebitis   '[x]'$ Swelling in legs   '[]'$ Varicose veins   '[]'$ Non-healing ulcers Pulmonary:   '[]'$ Uses home oxygen   '[]'$ Productive cough   '[]'$ Hemoptysis   '[]'$ Wheeze  '[]'$ COPD   '[]'$ Asthma Neurologic:  '[]'$ Dizziness   '[]'$ Seizures   '[]'$ History of stroke   '[]'$ History of TIA  '[]'$ Aphasia   '[]'$ Vissual changes   '[]'$ Weakness or numbness in arm   '[]'$ Weakness or numbness in leg Musculoskeletal:   '[]'$ Joint swelling   '[]'$ Joint pain   '[]'$ Low back pain Hematologic:  '[]'$ Easy bruising  '[]'$ Easy bleeding   '[]'$ Hypercoagulable state   '[]'$ Anemic Gastrointestinal:  '[]'$ Diarrhea   '[]'$ Vomiting  '[]'$ Gastroesophageal reflux/heartburn   '[]'$ Difficulty swallowing. Genitourinary:  '[]'$ Chronic kidney disease   '[]'$ Difficult urination  '[]'$ Frequent urination   '[]'$ Blood in urine Skin:  '[]'$ Rashes   '[]'$ Ulcers  Psychological:  '[]'$ History of anxiety   '[]'$  History of major depression.  Physical Examination  There were no vitals filed for this visit. There is no height or weight on file to calculate  BMI. Gen: WD/WN, NAD Head: [place]/AT, No temporalis wasting.  Ear/Nose/Throat: Hearing grossly intact, nares w/o erythema or drainage, pinna without lesions Eyes: PER, EOMI, sclera nonicteric.  Neck: Supple, no gross masses.  No JVD.  Pulmonary:  Good air movement, no audible wheezing, no use of accessory muscles.  Cardiac: RRR, precordium not hyperdynamic. Vascular:  scattered varicosities present bilaterally.  Mild venous stasis changes to the legs bilaterally.  2-3+ soft pitting edema right >>left, CEAP [id number]  Vessel Right Left  Radial Palpable Palpable  Gastrointestinal: soft, non-distended. No guarding/no peritoneal signs.  Musculoskeletal: M/S 5/5 throughout.  No deformity.  Neurologic: CN 2-12 intact. Pain and light touch intact in extremities.  Symmetrical.  Speech is fluent. Motor exam as listed above. Psychiatric: Judgment intact, Mood & affect appropriate for pt's clinical situation. Dermatologic: Venous rashes no ulcers noted.  No changes consistent with cellulitis. Lymph : No lichenification or skin changes of chronic lymphedema.  CBC Lab Results  Component Value Date   WBC 9.4 12/22/2021   HGB 10.1 (L) 12/22/2021   HCT 30.0 (L) 12/22/2021   MCV 90.1 12/22/2021   PLT 191 12/22/2021    BMET    Component Value Date/Time   NA 138 12/21/2021 0411   NA 140 08/12/2020 0000   K 4.7 12/21/2021 0411   CL 108 12/21/2021 0411   CO2 24 12/21/2021 0411   GLUCOSE 157 (H) 12/21/2021 0411   BUN 14 12/21/2021 0411   BUN 13 08/12/2020 0000   CREATININE 0.66 12/21/2021 0411   CALCIUM 8.6 (L) 12/21/2021 0411   GFRNONAA >60 12/21/2021 0411   GFRAA 76 08/12/2020 0000   CrCl cannot be calculated (Patient's most recent lab result is older than the maximum 21 days allowed.).  COAG Lab Results  Component Value Date   INR 0.9 12/09/2021    Radiology No results found.   Assessment/Plan 1. Lymphedema Recommend:  I have had a long discussion with the patient regarding  swelling and why it  causes symptoms.  Patient will begin wearing graduated compression on a daily basis a prescription was given. The patient will  wear the stockings first thing in the morning and removing them in the evening. The patient is instructed specifically not to sleep in the stockings.   In addition, behavioral modification will be initiated.  This will include frequent elevation, use of over the counter pain medications and exercise such as walking.  Consideration for a lymph pump will also be made based upon the effectiveness of conservative therapy.  This would help to improve the edema control and prevent sequela such as ulcers and infections   Patient should undergo duplex ultrasound of the venous system to ensure that DVT or reflux is not present.  The patient will follow-up with me after the ultrasound.  - VAS [place] LOWER EXTREMITY VENOUS REFLUX; Future  2. Primary hypertension Continue antihypertensive medications as already ordered, these medications have been reviewed and there are no changes at this time.  3. Primary osteoarthritis involving multiple joints Continue NSAID medications as already ordered, these medications have been reviewed and there are no changes at this time.  Continued activity and therapy was stressed.  4. Hyperlipidemia, unspecified hyperlipidemia type Continue statin as ordered and reviewed, no changes at this time    Hortencia Pilar, MD  12/24/2022 11:10 AM

## 2022-12-25 ENCOUNTER — Ambulatory Visit (INDEPENDENT_AMBULATORY_CARE_PROVIDER_SITE_OTHER): Payer: Medicare PPO | Admitting: Vascular Surgery

## 2022-12-25 ENCOUNTER — Encounter (INDEPENDENT_AMBULATORY_CARE_PROVIDER_SITE_OTHER): Payer: Self-pay | Admitting: Vascular Surgery

## 2022-12-25 VITALS — BP 129/81 | HR 75 | Ht 64.0 in | Wt 169.0 lb

## 2022-12-25 DIAGNOSIS — M159 Polyosteoarthritis, unspecified: Secondary | ICD-10-CM | POA: Diagnosis not present

## 2022-12-25 DIAGNOSIS — I10 Essential (primary) hypertension: Secondary | ICD-10-CM

## 2022-12-25 DIAGNOSIS — E785 Hyperlipidemia, unspecified: Secondary | ICD-10-CM

## 2022-12-25 DIAGNOSIS — I890 Lymphedema, not elsewhere classified: Secondary | ICD-10-CM

## 2022-12-26 ENCOUNTER — Encounter (INDEPENDENT_AMBULATORY_CARE_PROVIDER_SITE_OTHER): Payer: Self-pay | Admitting: Vascular Surgery

## 2022-12-30 ENCOUNTER — Encounter (INDEPENDENT_AMBULATORY_CARE_PROVIDER_SITE_OTHER): Payer: Self-pay | Admitting: Vascular Surgery

## 2022-12-31 ENCOUNTER — Encounter (INDEPENDENT_AMBULATORY_CARE_PROVIDER_SITE_OTHER): Payer: Self-pay | Admitting: Vascular Surgery

## 2023-01-28 IMAGING — MG MM DIGITAL SCREENING BILAT W/ TOMO AND CAD
8 series · 8 of 24 positions shown · non-contrast
Comparison: Previous exam(s).

CLINICAL DATA: Screening.

EXAM:
DIGITAL SCREENING BILATERAL MAMMOGRAM WITH TOMOSYNTHESIS AND CAD
TECHNIQUE: Bilateral screening digital craniocaudal and mediolateral oblique
mammograms were obtained. Bilateral screening digital breast
tomosynthesis was performed. The images were evaluated with
computer-aided detection.

[L CC synth-2D]
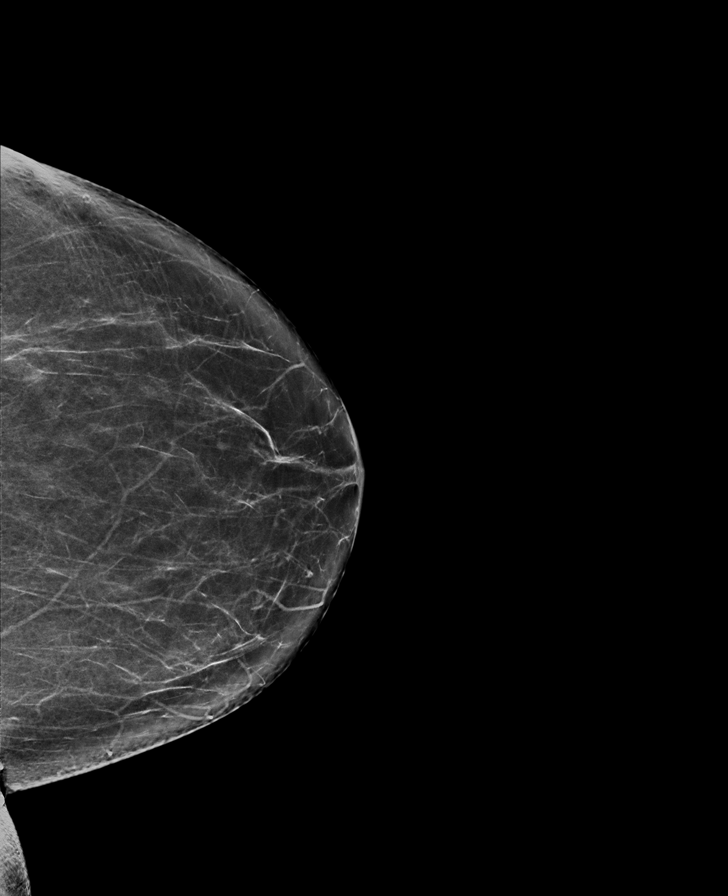

[L MLO synth-2D]
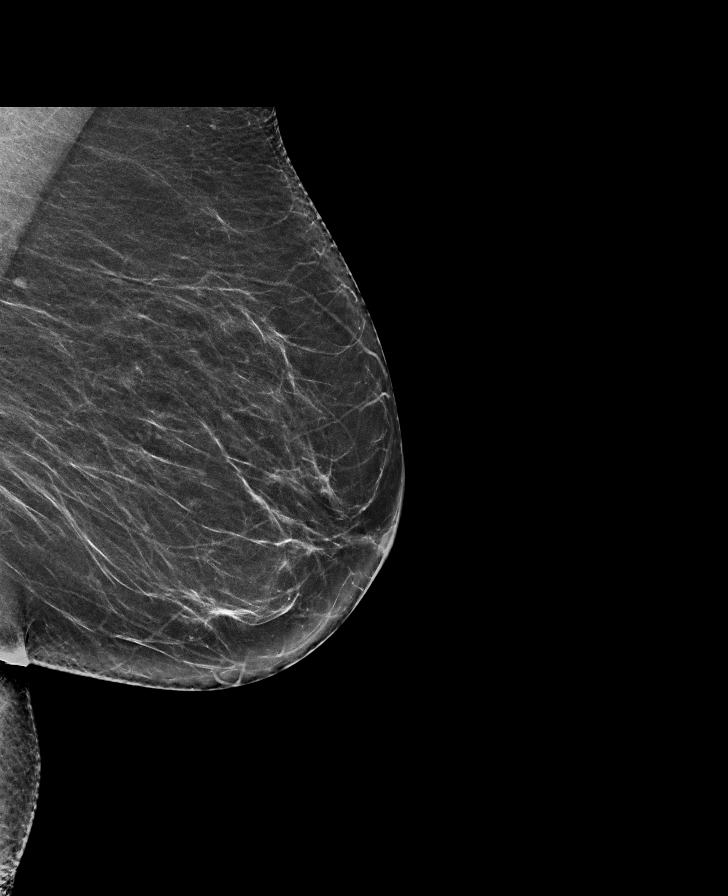

[R CC synth-2D]
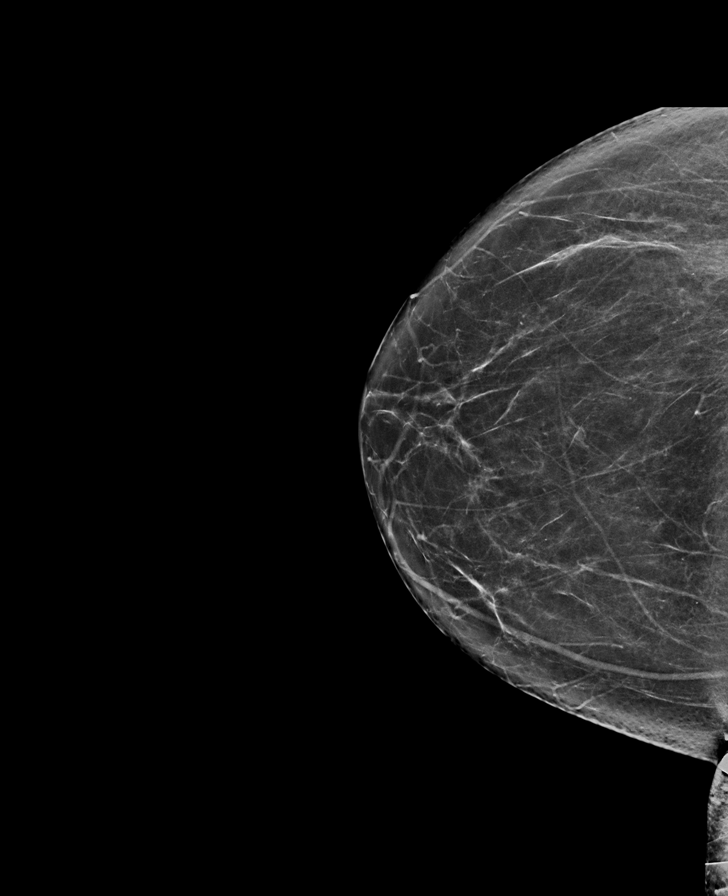

[R MLO synth-2D]
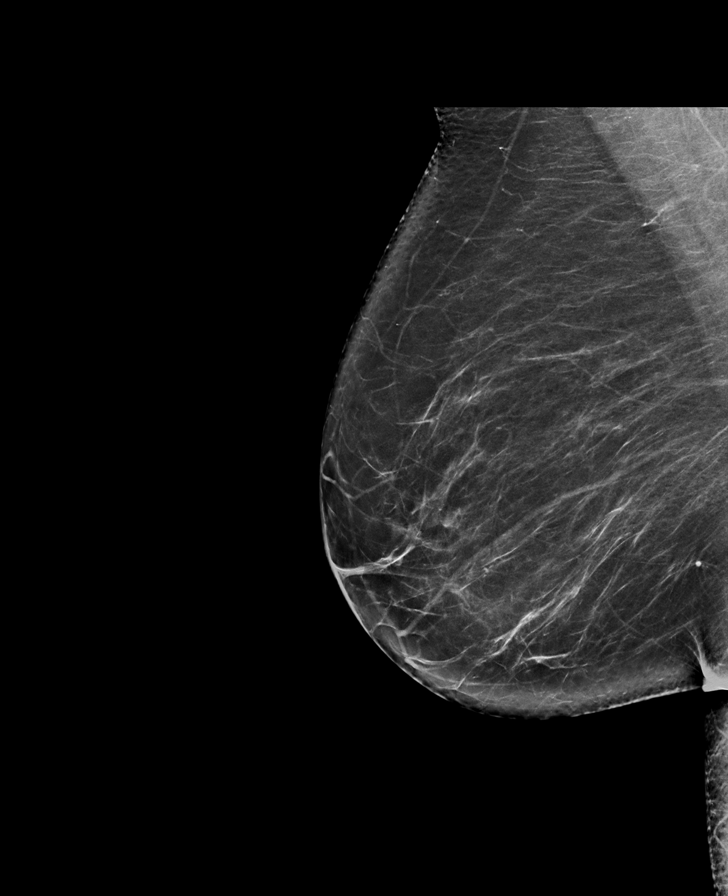

[L CC tomo · tomo slice 36/71.0]
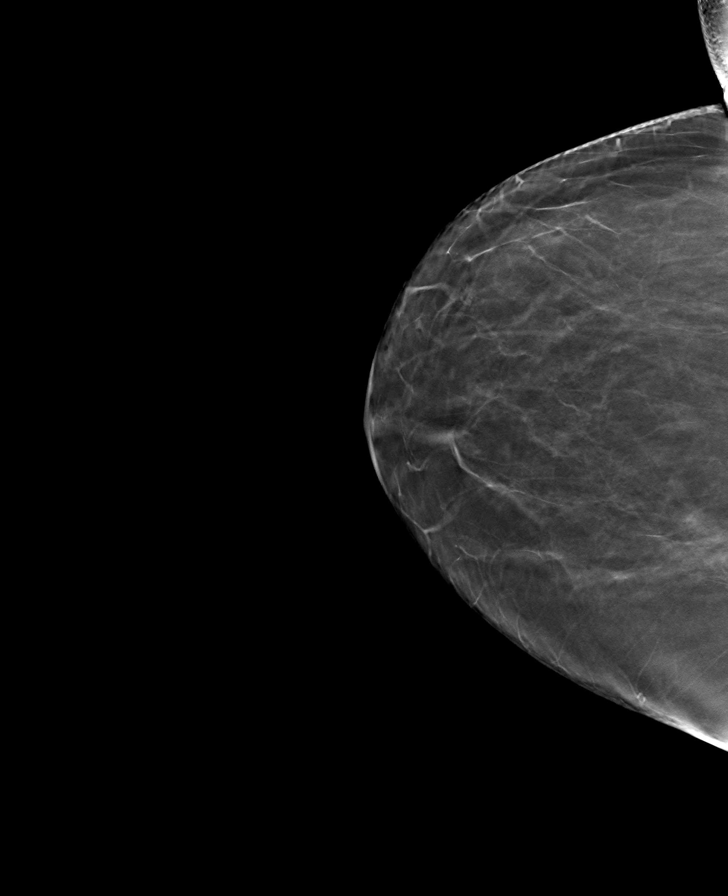

[L MLO tomo · tomo slice 35/70.0]
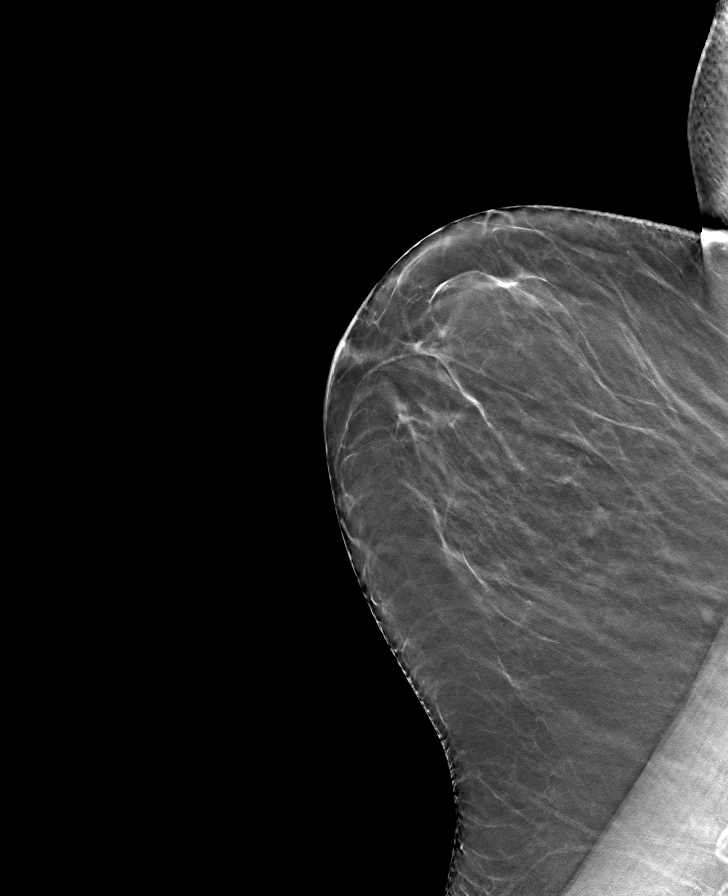

[R CC tomo · tomo slice 35/70.0]
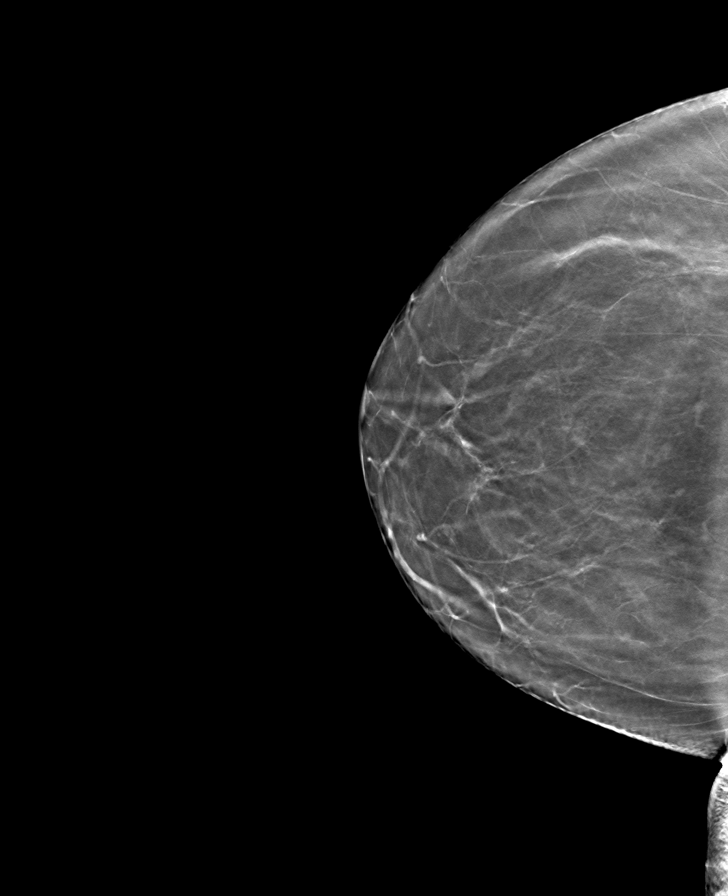

[R MLO tomo · tomo slice 39/76.0]
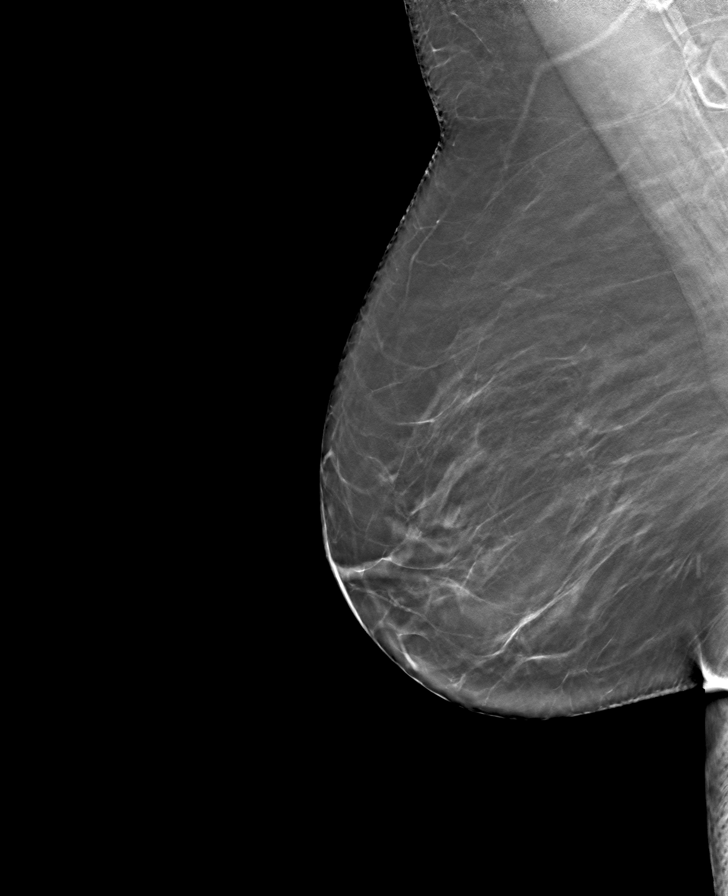

[8 of 24 positions shown; findings below may reference images not displayed]

ACR Breast Density Category b: There are scattered areas of
fibroglandular density.
FINDINGS: There are no findings suspicious for malignancy.
IMPRESSION: No mammographic evidence of malignancy. A result letter of this
screening mammogram will be mailed directly to the patient.

RECOMMENDATION:
Screening mammogram in one year. (Code:51-O-LD2)

BI-RADS CATEGORY  1: Negative.

## 2023-03-15 ENCOUNTER — Encounter (INDEPENDENT_AMBULATORY_CARE_PROVIDER_SITE_OTHER): Payer: Self-pay | Admitting: Vascular Surgery

## 2023-03-16 ENCOUNTER — Other Ambulatory Visit (INDEPENDENT_AMBULATORY_CARE_PROVIDER_SITE_OTHER): Payer: Self-pay | Admitting: Nurse Practitioner

## 2023-03-16 DIAGNOSIS — I890 Lymphedema, not elsewhere classified: Secondary | ICD-10-CM

## 2023-03-21 ENCOUNTER — Ambulatory Visit: Payer: Medicare PPO | Attending: Nurse Practitioner | Admitting: Occupational Therapy

## 2023-03-21 ENCOUNTER — Encounter: Payer: Self-pay | Admitting: Occupational Therapy

## 2023-03-21 DIAGNOSIS — I890 Lymphedema, not elsewhere classified: Secondary | ICD-10-CM | POA: Diagnosis present

## 2023-03-21 NOTE — Therapy (Signed)
OUTPATIENT PHYSICAL THERAPY LOWER EXTREMITY LYMPHEDEMA EVALUATION  Patient Name: Barbara Burgess MRN: [id number] DOB:02-Dec-1945, 77 y.o., female Today's Date: 03/21/2023  END OF SESSION:   Past Medical History:  Diagnosis Date   Arthritis    knee and hip,  per [facility] Patient Packet.    Bilateral bunions    Chronic ulcerative proctitis     per [facility] New Patient Packet.    Colitis    H/O CT scan of chest 11/27/2018   By Roland Rack, per [facility] New Patient Packet.    H/O mammogram 11/27/2018   By Pearlie Oyster at [facility], per [facility] New Patient Packet.    History of CT scan of abdomen 11/27/2018   By Roland Rack, per [facility] Patient Packet.    History of upper extremity x-ray 11/27/2018   By Lonny Prude, of Shoulder, per [facility] New Patient Packet.    History of x-ray of hip 11/27/2018   By Lonny Prude, of Hip, per [facility] New Patient Packet.    Hyperlipemia     per [facility] Patient Packet.    Hypertension     per [facility] New Patient Packet.    Impingement syndrome of shoulder     per [facility] New Patient Packet.    Incontinence    Mild Nighttime per [facility] New Patient Packet.   Insomnia    Melanoma    right lower leg; right ovary   Osteoarthritis of multiple joints    Rosacea     per [facility] New Patient Packet.    Sleep disorder    Past Surgical History:  Procedure Laterality Date   ABDOMINAL HYSTERECTOMY  11/28/1987   Partial per [facility] Patient Packet   ARTHROSCOPIC REPAIR ACL Left 11/28/2003   Left Knee/ Torn Medial Meniscus per [facility] New Patient Packet   BREAST BIOPSY     CATARACT EXTRACTION W/ INTRAOCULAR LENS IMPLANT Bilateral    COLONOSCOPY  11/27/2016   By Liberty Handy,  per [facility] New Patient Packet.    COLONOSCOPY  11/27/2017   By Liberty Handy, per [facility] New Patient Packet.    HYSTEROTOMY  11/28/1987   per [facility] New Patient Packet   KNEE SURGERY     MELANOMA EXCISION  11/27/1998   Right Shin per [facility] New Patient Packet   OOPHORECTOMY  11/27/2010   along with melanoma  mass per [facility] New Patient Packet   TONSILLECTOMY  11/27/1950   per [facility] New Patient Packet   TOTAL KNEE ARTHROPLASTY Left 12/20/2021   Procedure: TOTAL KNEE ARTHROPLASTY;  Surgeon: Juanell Fairly, MD;  Location: [facility] ORS;  Service: Orthopedics;  Laterality: Left;   TUBAL LIGATION  1976   Patient Active Problem List   Diagnosis Date Noted   Lymphedema 12/24/2022   S/P TKR (total knee replacement) using cement, 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 Sampson Goon, MD  REFERRING PROVIDER: Sheppard Plumber, NP  REFERRING DIAG: I89.0  THERAPY DIAG:  Lymphedema, not elsewhere classified  Rationale for Evaluation and Treatment: Rehabilitation  ONSET DATE: Onset of intermittent of RLE s/p XRT for R melanoma recurrence in 2012  SUBJECTIVE:                                                                                                                                                                                           SUBJECTIVE STATEMENT: Barbara Burgess is referred to Occupational Therapy by Sheppard Plumber, NP for evaluation and treatment of BLE lymphedema. Pt reports onset of fluctuating RLE swelling in 2012 soon after undergoing radiation therapy for a melanoma recurrence. Pt underwent prophylactic Complete Decongestive Therapy (CDT) at [facility] at recommendation of Dr. Despina Hick. Pt has been compliant with thigh length compression stocking for years (ccl ?) and performed self manual lymphatic drainage learned at [facility] clinic when she noticed swelling worsening 2023 she took a long care drive to [place] , [place] without wearing her stockings. She  noticed worsening distal leg swelling bilaterally this time, which has not resolved with compression and simple self-MLD.Pt is currently using ccl 1 compression pantyhose (20-30 mmHg) daily, but these are not available for assessment today. Pt denies history of cellulitis and non-pressure lower quadrant/ lower extremity wounds. Limb swelling is absent from her known family history.Pt reports there are a few values on recent blood work that are concerning for kidney issues, but cardiac causation for limb swelling is ruled out. Barbara Burgess tells me that she has an venous reflux test coming up. Her goal  for OT and lymphedema care are to, " Learn how to best manage my lymphedema and to keep it from getting worse ".  PERTINENT HISTORY:   relevant to LE: Takes NORVASC, HTN, L knee OA, hysterectomy, 1976 Tubal ligation, former smoker;  Oncology History 09/05/2011 Exploratory laparotomy and debulking of right pelvic sidewall mass.~ 2. Bilateral salpingo-oophorectomy.~ 3. Right pelvic lymph node sampling.~~~; Path: Right pelvic sidewall mass and right fallopian tube and ovary, excision; - Metastatic malignant melanoma involving matted lymph nodes with extracapsular; extension, size: 9.0 x 7.5 x 4.5 cm; ; Braf neg;  Stage III or stage IV melanoma (we cannot tell if this is a locally advanced stage III cancer or stage IV) of the right lower leg with metastasis to the right pelvic lymph nodes and ovaries.  Fall 2012: Radiation therapy L groin and LLQ  4 monthly CT scans until the fall of 2014 and then switch to every 6 monthly scans to year 5 (08/2016).  PAIN:  Are you having pain? Yes: NPRS scale: 4/10 Pain location: BLE, R>L Pain description: heavy, tight, full, neuropathic pain on top of R foot Aggravating factors: dependent sitting, standing, walking, hot weather Relieving factors: compression, elevation  PRECAUTIONS: Other: Lymphedema precautions; Hx melanoma  WEIGHT BEARING RESTRICTIONS:  No  FALLS:  Has patient fallen in last 6 months? No  LIVING ENVIRONMENT: Lives with: lives [facility] at [facility]) Lives in:  House/apartment Stairs: No;  Has following equipment at home: none  OCCUPATION: retired ; recently widowed, lives at [facility] and enjoys circle of friends, enjoys swimming for regular exercise  LEISURE: painting  HAND DOMINANCE: right   PRIOR LEVEL OF FUNCTION: Independent  PATIENT GOALS: Learn how to best manage my lymphedema to keep it from getting worse   OBJECTIVE:  COGNITION:  Overall cognitive status: Within functional limits for tasks assessed   OBSERVATIONS / OTHER ASSESSMENTS:   FOTO functional outcome measure: TBA at first OT Rx visit  Lymphedema Life Impact Scale (LLIS): 8.82% (The extent to which LE-related problems affected your life in the last week)  POSTURE: WNL  LE ROM: WNL  LE MMT: WFL for tasks performed  INFECTIONS: Denies Hx  Moderate, Stage  II, Cancer-related, Left Lower Extremity/ Left Lower Quadrant Lymphedema   Skin  Description Hyper-Keratosis Peau' de Orange Shiny Tight Fibrotic/ Indurated Fatty Doughy Spongy/ boggy     x x Moderate, distal x  x   Skin dry Flaky WNL Macerated   mildly      Color Redness Varicosities Blanching Hemosiderin Stain Mottled    x    x   Odor Malodorous Yeast Fungal infection  WNL      x   Temperature Warm Cool wnl     x    Pitting Edema   1+ 2+ 3+ 4+ Non-pitting         x   Girth Symmetrical Asymmetrical                   Distribution    L>R toes to groin    Stemmer Sign Positive Negative   +    Lymphorrhea History Of:  Present Absent     x    Wounds History Of Present Absent Venous Arterial Pressure Sheer     x        Signs of Infection Redness/ Erythema Warmth odor Acute Swelling Drainage Borders                    Sensation Light Touch Deep pressure Hypersensitivity   Present Impaired Present Impaired Absent  Impaired   In Tact  In tact  x     Nails WNL   Fungus nail dystrophy        Hair Growth Symmetrical Asymmetrical   x    Skin Creases Base of toes  Ankles   Base of Fingers knees       Abdominal pannus Thigh Lobules  Face/neck   x x  x        BLE COMPARATIVE LIMB VOLUMETRICS: TBA at OT Rx visit 1  LANDMARK RIGHT    R LEG (A-D) N/A  R THIGH (E-G) ml  R FULL LIMB (A-G) ml  Limb Volume differential (LVD)  %  Volume change since initial %  Volume change overall V  (Blank rows = not tested)  LANDMARK LEFT  07/19/22  L LEG (A-D) N/A  L THIGH (E-G) ml  L FULL LIMB (A-G) ml  Limb Volume differential (LVD)  %  Volume change since initial %  Volume change overall %  (Blank rows = not tested)     GAIT: WNL Distance walked: > 500' Assistive device utilized: None Level of assistance: Complete Independence   TODAY'S TREATMENT:  DATE: 03/21/23  OT Initial Evaluation for Lymphedema  PATIENT EDUCATION:  Education details: Provided Pt/ family education regarding lymphatic structure and function, lymphedema etiology, onset patterns and stages of progression. Taught interaction between blood circulatory system and lymphatic circulation.Discussed  impact of gravity and co-morbidities on lymphatic function. Outlined Complete Decongestive Therapy (CDT)  as standard of care and provided in depth information regarding 4 primary components of Intensive and Self Management Phases, including Manual Lymph Drainage (MLD), compression wrapping and garments, skin care, and therapeutic exercise. Homero Fellers discussion with re need for frequent attendance and high burden of care when caregiver is needed, impact of co morbidities. We discussed  the chronic, progressive nature of lymphedema and Importance of daily, ongoing LE self-care essential for limiting progression  and infection risk. Discussed the demanding nature of CDT, high burden of care between visits and throughout self-management phase,  and importance of high level of compliance with all home program components for optimal outcome.  Person educated: Patient Education method: Explanation, Demonstration, Tactile cues, and Handouts Education comprehension: verbalized understanding, returned demonstration, verbal cues required, and needs further education  LYMPHEDEMA SELF-CARE HOME PROGRAM: all to be performed daily and ongoing as LE is a chronic, progressive condition with no cure RLE/RLQ Simple Self-Manual Lymphatic Drainage (MLD) Skin care to limit infection risk and facilitate essential skin excursion or optimal lymphatic function Lymphatic Pumping there ex- 2 x daily, in sequence, 10 reps bilaterally. Hole 5 seconds each Compression  During Intensive Phase CDT patients undergo short stretch bandaging using gradient techniques one limb at a time, toes to groin During Self-management phase of CDT Pt's utilize appropriate compression garments and HOS devices that meet their individual needs, abilities and lifestyle.  Custom-made gradient compression garments and HOS devices are medically necessary in this case because they are uniquely sized and shaped to fit the exact dimensions of the affected extremities with deformities, and to provide accurate and consistent gradient compression and containment, essential to optimally managing this patient's symptoms of chronic, progressive lymphedema. Multiple custom compression garments are needed for optimal hygiene to limit infection risk. Custom compression garments should be replaced q 3-6 months When worn consistently for optimal lipo-lymphedema self-management over time.  ASSESSMENT:  CLINICAL IMPRESSION: Barbara Burgess is a 77 yo female presenting with moderate, Stage II, cancer related, LLE/LLQ LE lymphedema, and mild, stage 1, RLE lymphedema 2/2  suspected venous insufficiency. Barbara Burgess is a 77 y.o. female presenting with moderate, stage II, LLE/LLQ, cancer-related lymphedema 2/2 surgical intervention and radiation therapy for initial episode of melanoma   in 2000, and reoccurrence in 2012. Pt also presents with mild, stage I, RLE Lymphedema 2/2 suspected venous insufficiency.  Progressing BLE lymphedema limits Pt's functional performance in all occupational domains, including standing, walking and dependent sitting tolerance, basic and instrumental ADLs ( Lower body dressing: fitting preferred street shoes and LB clothing; driving, shopping) productive activities, leisure pursuits and social participation. BLE lymphedema contributes to elevated infection risk and increased falls risk due to body asymmetry. Barbara Burgess will benefit from skilled OT for Complete Decongestive Therapy (CDT) the current gold standard of manual lymphedema care. CDT includes manual lymphatic drainage (MLD), skin care to limit infection risk and increase skin excursion, lymphatic pumping exercise, and during the Intensive Phase, multilayer, gradient compression bandaging to reduce limb volume. Once limb volume reduction reaches a clinical plateau appropriate garments that provide appropriate compression and containment are recommended and fitted. Throughout the treatment course Pt/ caregiver will learn lymphedema prevention strategies and precautions,  learn to perform all LE self-care home program components. Without skilled OT for lymphedema care, Pt's condition will progress and further functional decline is expected.  OBJECTIVE IMPAIRMENTS: decreased knowledge of condition, decreased knowledge of use of DME, chronic progressive BLE swelling, R>L , LE associated pain and skin changes,   ACTIVITY LIMITATIONS: gravity dependent sitting, standing and extended walking exacerbate limb swelling and associated pain limiting functional ambulation and mobility; limb swelling limits  Pt's ability to fit street shoes and socks; BLE LE and pain limits social participation; LE limits body image, and  "gets me down" causing feelings of anger, depression or frustration.   PARTICIPATION LIMITATIONS: painting hobby, social participation in community activities requiring extended sitting, standing, walking  PERSONAL FACTORS: Time since onset of injury/illness/exacerbation and 1-2 comorbidities: (Ca hx w XRT, HTN)  are also affecting patient's functional outcome.   REHAB POTENTIAL: Good  EVALUATION COMPLEXITY: Moderate   GOALS: Goals reviewed with patient? Yes  SHORT TERM GOALS: Target date: 4th OT Rx visit  Pt will demonstrate understanding of lymphedema precautions and prevention strategies with modified independence using a printed reference to identify at least 5 precautions and discussing how s/he may implement them into daily life to reduce risk of progression with extra time. Baseline:Max A Goal status: INITIAL  2.  Pt will be able to apply multilayer, thigh length, gradient, compression wraps to one leg at a time with modified assistance (extra time) to decrease limb volume, to limit infection risk, and to limit lymphedema progression.  Baseline: Max A Goal status: INITIAL   LONG TERM GOALS: Target date: 06/20/23  Given this patient's Intake score of TBA/100% on the functional outcomes FOTO tool, patient will experience an increase in function of 3 points to improve basic and instrumental ADLs performance, including lymphedema self-care.  Baseline: TBA Goal status: INITIAL  2.  Given this patient's Intake score of 8.82 % on the Lymphedema Life Impact Scale (LLIS), patient will experience a reduction of at least 5 points in her perceived level of functional impairment resulting from lymphedema to improve functional performance and quality of life (QOL). Baseline: 8.82% Goal status: INITIAL  3.   During Intensive phase CDT Pt will achieve at least 85% compliance  with all lymphedema self-care home program components, including daily skin care, compression wraps and /or garments, simple self MLD and lymphatic pumping therex to habituate LE self care protocol  into ADLs for optimal LE self-management over time. Baseline: Max A Goal status: INITIAL  4.  Pt will achieve at least a 10% volume reduction in the LLE to return limb to typical size and shape, to limit infection risk and LE progression, to decrease pain, to improve function. Baseline: Dependent Goal status: INITIAL  5.  Pt will obtain proper, recommended compression garments/devices and be modified independent with donning/doffing to optimize limb volume reduction and limit LE  progression over time. Baseline:  Goal status: INITIAL  PLAN:  PT FREQUENCY: 2x/week and PRN  PT DURATION: 12 weeks and PRN  PLANNED INTERVENTIONS:  Complete Decongestive Therapy: Therapeutic lymphatic pumping exercises, Manual lymph drainage (MLD), Multilayer, gradient Compression bandaging, Manual therapy, skin care to limit infection risk, scar massage; During self management phase Pt will be fit with appropriate compression garments and devices,  Therapeutic activities Patient/Family education for LE Self Care home program  PLAN FOR NEXT SESSION:  BLE comparative limb volumetrics LLE knee length multilayer compression wraps Commence LE self care home program edu   Loel Dubonnet, MS, OTR/L, CLT-LANA 03/22/23  11:11 AM

## 2023-03-25 DIAGNOSIS — I872 Venous insufficiency (chronic) (peripheral): Secondary | ICD-10-CM | POA: Insufficient documentation

## 2023-03-25 NOTE — Progress Notes (Signed)
MRN : [id number]  Barbara Burgess is a 77 y.o. ([birth year]) female who presents with chief complaint of legs hurt and swell.  History of Present Illness:   The patient returns to the office for followup evaluation regarding leg swelling.  The swelling has improved quite a bit and the pain associated with swelling has decreased substantially. There have not been any interval development of a ulcerations or wounds.  Since the previous visit the patient has been wearing graduated compression stockings and has noted some improvement in the lymphedema. The patient has been using compression routinely morning until night.  The patient also states elevation during the day and exercise (such as walking) is being done too.  Duplex ultrasound of the venous system demonstrates normal deep venous system bilaterally.  There is no evidence of deep or superficial thrombosis.  Trivial reflux is noted on the right at the saphenofemoral junction but otherwise the great saphenous vein is competent.  No evidence of superficial reflux on the left.    No outpatient medications have been marked as taking for the 03/26/23 encounter (Appointment) with Gilda Crease, Latina Craver, MD.    Past Medical History:  Diagnosis Date   Arthritis    knee and hip,  per [facility] New Patient Packet.    Bilateral bunions    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HCC)     per [facility] New Patient Packet.    Colitis    H/O CT scan of chest 11/27/2018   By Roland Rack, per [facility] New Patient Packet.    H/O mammogram 11/27/2018   By Pearlie Oyster at [facility], per [facility] New Patient Packet.    History of CT scan of abdomen 11/27/2018   By Roland Rack, per [facility] New Patient Packet.    History of upper extremity x-ray 11/27/2018   By Lonny Prude, of Shoulder, per [facility] New Patient Packet.    History of x-ray of hip 11/27/2018   By Lonny Prude, of Hip, per [facility] New Patient Packet.    Hyperlipemia     per [facility] New Patient Packet.     Hypertension     per [facility] New Patient Packet.    Impingement syndrome of shoulder     per [facility] New Patient Packet.    Incontinence    Mild Nighttime per pcs New Patient Packet.   Insomnia    Melanoma (HCC)    right lower leg; right ovary   Osteoarthritis of multiple joints    Rosacea     per [facility] New Patient Packet.    Sleep disorder     Past Surgical History:  Procedure Laterality Date   ABDOMINAL HYSTERECTOMY  11/28/1987   Partial per [facility] New Patient Packet   ARTHROSCOPIC REPAIR ACL Left 11/28/2003   Left Knee/ Torn Medial Meniscus per [facility] New Patient Packet   BREAST BIOPSY     CATARACT EXTRACTION W/ INTRAOCULAR LENS IMPLANT Bilateral    COLONOSCOPY  11/27/2016   By Liberty Handy,  per [facility] New Patient Packet.    COLONOSCOPY  11/27/2017   By Liberty Handy, per [facility] New Patient Packet.    HYSTEROTOMY  11/28/1987   per [facility] New Patient Packet   KNEE SURGERY     MELANOMA EXCISION  11/27/1998   Right Shin per [facility] New Patient Packet   OOPHORECTOMY  11/27/2010   along with melanoma mass per [facility] New Patient Packet   TONSILLECTOMY  11/27/1950   per pcs New Patient  Packet   TOTAL KNEE ARTHROPLASTY Left 12/20/2021   Procedure: TOTAL KNEE ARTHROPLASTY;  Surgeon: Juanell Fairly, MD;  Location: [facility] ORS;  Service: Orthopedics;  Laterality: Left;   TUBAL LIGATION  1976    Social History Social History   Tobacco Use   Smoking status: Former    Packs/day: 1    Types: Cigarettes    Quit date: 2006    Years since quitting: 18.3   Smokeless tobacco: Never  Vaping Use   Vaping Use: Never used  Substance Use Topics   Alcohol use: Yes    Comment: 2 a week   Drug use: Never    Family History Family History  Problem Relation Age of Onset   Alzheimer's disease Mother    Lung cancer Maternal Grandfather    Heart attack Paternal Grandfather    Diabetes Neg Hx     Allergies  Allergen Reactions   Other Diarrhea   Atorvastatin     Elevated Liver Enzymes.      REVIEW OF  SYSTEMS (Negative unless checked)  Constitutional: [] Weight loss  [] Fever  [] Chills Cardiac: [] Chest pain   [] Chest pressure   [] Palpitations   [] Shortness of breath when laying flat   [] Shortness of breath with exertion. Vascular:  [] Pain in legs with walking   [x] Pain in legs at rest  [] History of DVT   [] Phlebitis   [x] Swelling in legs   [] Varicose veins   [] Non-healing ulcers Pulmonary:   [] Uses home oxygen   [] Productive cough   [] Hemoptysis   [] Wheeze  [] COPD   [] Asthma Neurologic:  [] Dizziness   [] Seizures   [] History of stroke   [] History of TIA  [] Aphasia   [] Vissual changes   [] Weakness or numbness in arm   [] Weakness or numbness in leg Musculoskeletal:   [] Joint swelling   [] Joint pain   [] Low back pain Hematologic:  [] Easy bruising  [] Easy bleeding   [] Hypercoagulable state   [] Anemic Gastrointestinal:  [] Diarrhea   [] Vomiting  [] Gastroesophageal reflux/heartburn   [] Difficulty swallowing. Genitourinary:  [] Chronic kidney disease   [] Difficult urination  [] Frequent urination   [] Blood in urine Skin:  [] Rashes   [] Ulcers  Psychological:  [] History of anxiety   []  History of major depression.  Physical Examination  There were no vitals filed for this visit. There is no height or weight on file to calculate BMI. Gen: WD/WN, NAD Head: [place]/AT, No temporalis wasting.  Ear/Nose/Throat: Hearing grossly intact, nares w/o erythema or drainage, pinna without lesions Eyes: PER, EOMI, sclera nonicteric.  Neck: Supple, no gross masses.  No JVD.  Pulmonary:  Good air movement, no audible wheezing, no use of accessory muscles.  Cardiac: RRR, precordium not hyperdynamic. Vascular:  scattered varicosities present bilaterally.  Mild venous stasis changes to the legs bilaterally.  1-2+ soft pitting edema. CEAP [id number]   Vessel Right Left  Radial Palpable Palpable  Gastrointestinal: soft, non-distended. No guarding/no peritoneal signs.  Musculoskeletal: M/S 5/5 throughout.  No deformity.   Neurologic: CN 2-12 intact. Pain and light touch intact in extremities.  Symmetrical.  Speech is fluent. Motor exam as listed above. Psychiatric: Judgment intact, Mood & affect appropriate for pt's clinical situation. Dermatologic: Venous rashes no ulcers noted.  No changes consistent with cellulitis. Lymph : No lichenification or skin changes of chronic lymphedema.  CBC Lab Results  Component Value Date   WBC 9.4 12/22/2021   HGB 10.1 (L) 12/22/2021   HCT 30.0 (L) 12/22/2021   MCV 90.1 12/22/2021   PLT 191 12/22/2021  BMET    Component Value Date/Time   NA 138 12/21/2021 0411   NA 140 08/12/2020 0000   K 4.7 12/21/2021 0411   CL 108 12/21/2021 0411   CO2 24 12/21/2021 0411   GLUCOSE 157 (H) 12/21/2021 0411   BUN 14 12/21/2021 0411   BUN 13 08/12/2020 0000   CREATININE 0.66 12/21/2021 0411   CALCIUM 8.6 (L) 12/21/2021 0411   GFRNONAA >60 12/21/2021 0411   GFRAA 76 08/12/2020 0000   CrCl cannot be calculated (Patient's most recent lab result is older than the maximum 21 days allowed.).  COAG Lab Results  Component Value Date   INR 0.9 12/09/2021    Radiology No results found.   Assessment/Plan 1. Chronic venous insufficiency Recommend:  No surgery or intervention at this point in time.  I have reviewed my discussion with the patient regarding venous insufficiency and why it causes symptoms. I have discussed with the patient the chronic skin changes that accompany venous insufficiency and the long term sequela such as ulceration. Patient will contnue wearing graduated compression stockings on a daily basis, as this has provided excellent control of his edema. The patient will put the stockings on first thing in the morning and removing them in the evening. The patient is reminded not to sleep in the stockings.  In addition, behavioral modification including elevation during the day will be initiated. Exercise is strongly encouraged.  Previous duplex ultrasound  of the lower extremities shows normal deep system, no significant superficial reflux was identified.  Given the patient's good control and lack of any problems regarding the venous insufficiency and lymphedema a lymph pump in not need at this time.    The patient will follow up with me PRN should anything change.  The patient voices agreement with this plan.  2. Lymphedema Recommend:  No surgery or intervention at this point in time.  I have reviewed my discussion with the patient regarding venous insufficiency and why it causes symptoms. I have discussed with the patient the chronic skin changes that accompany venous insufficiency and the long term sequela such as ulceration. Patient will contnue wearing graduated compression stockings on a daily basis, as this has provided excellent control of his edema. The patient will put the stockings on first thing in the morning and removing them in the evening. The patient is reminded not to sleep in the stockings.  In addition, behavioral modification including elevation during the day will be initiated. Exercise is strongly encouraged.  Previous duplex ultrasound of the lower extremities shows normal deep system, no significant superficial reflux was identified.  Given the patient's good control and lack of any problems regarding the venous insufficiency and lymphedema a lymph pump in not need at this time.    The patient will follow up with me PRN should anything change.  The patient voices agreement with this plan.  3. Primary hypertension Continue antihypertensive medications as already ordered, these medications have been reviewed and there are no changes at this time.  4. Primary osteoarthritis involving multiple joints Continue NSAID medications as already ordered, these medications have been reviewed and there are no changes at this time.  Continued activity and therapy was stressed.  5. Hyperlipidemia, unspecified hyperlipidemia  type Continue statin as ordered and reviewed, no changes at this time    Levora Dredge, MD  03/25/2023 12:20 PM

## 2023-03-26 ENCOUNTER — Ambulatory Visit (INDEPENDENT_AMBULATORY_CARE_PROVIDER_SITE_OTHER): Payer: Medicare PPO

## 2023-03-26 ENCOUNTER — Encounter (INDEPENDENT_AMBULATORY_CARE_PROVIDER_SITE_OTHER): Payer: Self-pay | Admitting: Vascular Surgery

## 2023-03-26 ENCOUNTER — Ambulatory Visit (INDEPENDENT_AMBULATORY_CARE_PROVIDER_SITE_OTHER): Payer: Medicare PPO | Admitting: Vascular Surgery

## 2023-03-26 VITALS — BP 105/67 | HR 80 | Resp 18 | Ht 64.0 in | Wt 178.6 lb

## 2023-03-26 DIAGNOSIS — I872 Venous insufficiency (chronic) (peripheral): Secondary | ICD-10-CM

## 2023-03-26 DIAGNOSIS — I890 Lymphedema, not elsewhere classified: Secondary | ICD-10-CM

## 2023-03-26 DIAGNOSIS — M150 Primary generalized (osteo)arthritis: Secondary | ICD-10-CM

## 2023-03-26 DIAGNOSIS — I10 Essential (primary) hypertension: Secondary | ICD-10-CM

## 2023-03-26 DIAGNOSIS — E785 Hyperlipidemia, unspecified: Secondary | ICD-10-CM

## 2023-03-26 DIAGNOSIS — M159 Polyosteoarthritis, unspecified: Secondary | ICD-10-CM | POA: Diagnosis not present

## 2023-03-28 ENCOUNTER — Other Ambulatory Visit: Payer: Self-pay | Admitting: Infectious Diseases

## 2023-03-28 DIAGNOSIS — Z1231 Encounter for screening mammogram for malignant neoplasm of breast: Secondary | ICD-10-CM

## 2023-03-29 ENCOUNTER — Ambulatory Visit: Payer: Medicare PPO | Attending: Nurse Practitioner | Admitting: Occupational Therapy

## 2023-03-29 DIAGNOSIS — I890 Lymphedema, not elsewhere classified: Secondary | ICD-10-CM | POA: Diagnosis present

## 2023-03-29 NOTE — Therapy (Signed)
OUTPATIENT PHYSICAL THERAPY LOWER EXTREMITY LYMPHEDEMA EVALUATION  Patient Name: Barbara Burgess MRN: [id number] DOB:March 05, 1946, 77 y.o., female Today's Date: 03/29/2023  END OF SESSION:   Past Medical History:  Diagnosis Date   Arthritis    knee and hip,  per [facility] New Patient Packet.    Bilateral bunions    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HCC)     per [facility] New Patient Packet.    Colitis    H/O CT scan of chest 11/27/2018   By Roland Rack, per [facility] New Patient Packet.    H/O mammogram 11/27/2018   By Pearlie Oyster at [facility], per psc New Patient Packet.    History of CT scan of abdomen 11/27/2018   By Roland Rack, per [facility] New Patient Packet.    History of upper extremity x-ray 11/27/2018   By Lonny Prude, of Shoulder, per [facility] New Patient Packet.    History of x-ray of hip 11/27/2018   By Lonny Prude, of Hip, per [facility]sc New Patient Packet.    Hyperlipemia     per [facility] New Patient Packet.    Hypertension     per [facility] New Patient Packet.    Impingement syndrome of shoulder     per [facility] New Patient Packet.    Incontinence    Mild Nighttime per [facility] New Patient Packet.   Insomnia    Melanoma (HCC)    right lower leg; right ovary   Osteoarthritis of multiple joints    Rosacea     per [facility] New Patient Packet.    Sleep disorder    Past Surgical History:  Procedure Laterality Date   ABDOMINAL HYSTERECTOMY  11/28/1987   Partial per [facility] New Patient Packet   ARTHROSCOPIC REPAIR ACL Left 11/28/2003   Left Knee/ Torn Medial Meniscus per [facility]cs New Patient Packet   BREAST BIOPSY     CATARACT EXTRACTION W/ INTRAOCULAR LENS IMPLANT Bilateral    COLONOSCOPY  11/27/2016   By Liberty Handy,  per [facility] New Patient Packet.    COLONOSCOPY  11/27/2017   By Liberty Handy, per [facility] New Patient Packet.    HYSTEROTOMY  11/28/1987   per [facility] New Patient Packet   KNEE SURGERY     MELANOMA EXCISION  11/27/1998   Right Shin per [facility]cs New Patient Packet   OOPHORECTOMY  11/27/2010   along  with melanoma mass per pcs New Patient Packet   TONSILLECTOMY  11/27/1950   per [facility] New Patient Packet   TOTAL KNEE ARTHROPLASTY Left 12/20/2021   Procedure: TOTAL KNEE ARTHROPLASTY;  Surgeon: Juanell Fairly, MD;  Location: [facility] ORS;  Service: Orthopedics;  Laterality: Left;   TUBAL LIGATION  1976   Patient Active Problem List   Diagnosis Date Noted   Chronic venous insufficiency 03/25/2023   Lymphedema 12/24/2022   S/P TKR (total knee replacement) using cement, 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 Sampson Goon, MD  REFERRING PROVIDER: Sheppard Plumber, NP  REFERRING DIAG: I89.0  THERAPY DIAG:  No diagnosis found.  Rationale for Evaluation and Treatment: Rehabilitation  ONSET DATE: Onset of intermittent of RLE  s/p XRT for R melanoma recurrence in 2012  SUBJECTIVE:                                                                                                                                                                                           SUBJECTIVE STATEMENT: [name] is referred to Occupational Therapy by Sheppard Plumber, NP for evaluation and treatment of BLE lymphedema. Pt reports onset of fluctuating RLE swelling in 2012 soon after undergoing radiation therapy for a melanoma recurrence. Pt underwent prophylactic Complete Decongestive Therapy (CDT) at [facility] at recommendation of Dr. Despina Hick. Pt has been compliant with thigh length compression stocking for years (ccl ?) and performed self manual lymphatic drainage learned at [facility] clinic when she noticed swelling worsening 2023 she took a long care drive to  [place] , [place] without wearing her stockings. She noticed worsening distal leg swelling bilaterally this time, which has not resolved with compression and simple self-MLD.Pt is currently using ccl 1 compression pantyhose (20-30 mmHg) daily, but these are not available for assessment today. Pt denies history of cellulitis and non-pressure lower quadrant/ lower extremity wounds. Limb swelling is absent from her known family history.Pt reports there are a few values on recent blood work that are concerning for kidney issues, but cardiac causation for limb swelling is ruled out. [name] tells me that she has an venous reflux test coming up. Her goal  for OT and lymphedema care are to, " Learn how to best manage my lymphedema and to keep it from getting worse ".  03/29/23: Lyndle Herrlich presents for first OT visit   for modified  Intensive Phase CDT to cancer-related  BLE lymphedema, R>L. Pt  reports 2/10 pain at anterior ankles where existing compression pantyhose are bunching. Pt has no other new complaints.  PERTINENT HISTORY:   relevant to LE: Takes NORVASC, HTN, L knee OA, hysterectomy, 1976 Tubal ligation, former smoker;  Oncology History 09/05/2011 Exploratory laparotomy and debulking of right pelvic sidewall mass.~ 2. Bilateral salpingo-oophorectomy.~ 3. Right pelvic lymph node sampling.~~~; Path: Right pelvic sidewall mass and right fallopian tube and ovary, excision; - Metastatic malignant melanoma involving matted lymph nodes with extracapsular; extension, size: 9.0 x 7.5 x 4.5 cm; ; Braf neg;  Stage III or stage IV melanoma (we cannot tell if this is a locally advanced stage III cancer or stage IV) of the right lower leg with metastasis to the right pelvic lymph nodes and ovaries.  Fall 2012: Radiation therapy L groin and LLQ  4 monthly CT scans until the fall of 2014 and then switch to every 6 monthly scans to year 5 (08/2016).  PAIN:  Are you having pain? Yes: NPRS scale: 4/10 Pain  location: BLE, R>L Pain description: heavy, tight, full, neuropathic pain on top of R foot Aggravating factors: dependent sitting, standing, walking, hot weather Relieving factors: compression, elevation  PRECAUTIONS: Other: Lymphedema precautions; Hx melanoma  HAND DOMINANCE: right   PRIOR LEVEL OF FUNCTION: Independent  PATIENT GOALS: Learn how to best manage my lymphedema to keep it from getting worse   OBJECTIVE:  OBSERVATIONS / OTHER ASSESSMENTS:  Moderate, Stage  II, Cancer-related, Left Lower Extremity/ Left Lower Quadrant Lymphedema   FOTO functional outcome measure: TBA at first OT Rx visit  Lymphedema Life Impact Scale (LLIS): 8.82% (The extent to which LE-related problems affected your life in the last week)   BLE COMPARATIVE LIMB VOLUMETRICS: Initial 03/29/23  LANDMARK RIGHT    R LEG (A-D) 3644.0 ml  R THIGH (E-G) 6111.3 ml  R FULL LIMB (A-G) 9755.2 ml  Limb Volume differential (LVD)  %  Volume change since initial %  Volume change overall V  (Blank rows = not tested)  LANDMARK LEFT   L LEG (A-D) 4076.7 ml  L THIGH (E-G) 6365.5 ml  L FULL LIMB (A-G) 10443.15 ml  Limb Volume differential (LVD)  LEG LVD= 12.9%, L>R Thigh LVD =4.2 %. L>R Full LIMB LVD = 7.05%, L>R  Volume change since initial %  Volume change overall %  (Blank rows = not tested)     TODAY'S TREATMENT:       1.BLE comparative limb volumetrics 2. PATIENT EDUCATION: Continued Pt/ CG edu for lymphedema self care home program throughout session. Topics include outcome of comparative limb volumetrics- starting limb volume differentials (LVDs), technology and gradient techniques used for short stretch, multilayer compression wrapping, simple self-MLD, therapeutic lymphatic pumping exercises, skin/nail care, LE precautions,. compression garment recommendations and specifications, wear and care schedule and compression garment donning / doffing w assistive devices. Discussed progress towards all OT  goals since commencing CDT. All questions answered to the Pt's satisfaction. Good return. Person educated: Patient Education method: Explanation, Demonstration, Tactile cues, and Handouts Education comprehension: verbalized understanding, returned demonstration, verbal cues required, and needs further education  LYMPHEDEMA SELF-CARE HOME PROGRAM: all to be performed daily and ongoing as LE is a chronic, progressive condition with no cure RLE/RLQ Simple Self-Manual Lymphatic Drainage (MLD) Skin care to limit infection risk and facilitate essential skin excursion or optimal lymphatic function Lymphatic Pumping there ex- 2 x daily, in sequence, 10 reps bilaterally. Hole 5 seconds each Compression  During Intensive Phase CDT patients undergo short stretch bandaging using gradient techniques one limb at a time, toes to groin During Self-management phase of CDT Pt's utilize appropriate compression garments and HOS devices that meet their individual needs, abilities and lifestyle.  Custom-made gradient compression garments and HOS devices are medically necessary in this case because they are uniquely sized and shaped to fit the exact dimensions of the affected extremities with deformities, and to provide accurate and consistent gradient compression and containment, essential to optimally managing this patient's symptoms of chronic, progressive lymphedema. Multiple custom compression garments are needed for optimal hygiene to limit infection risk. Custom compression garments should be replaced q 3-6 months When worn consistently for optimal lipo-lymphedema self-management over time.  ASSESSMENT: CLINICAL IMPRESSION: Initial BLE comparative limb volumetrics reveal LLE limb volume differential (LVD) is greater in volume at all segments, leg, thigh and full limb, than the lymphedematous LLE. LEG LVD= 12.9%, L>R. Thigh LVD =4.2 %. L>R, and,  full LIMB LVD =  7.05%, L>R. By visual assessment the difference in limb  volume is noticeable, but subtle. Rather than spongy, palpable,fatty, fibrosis and swelling palpable in the RLE, the larger LLE, which had knee replacement ~ 6 months ago, appears to have greater muscle mass than the RLE. Pt confirms she had PT course which included strengthening. No time to apply compression wraps to R leg today. We'll attempt that next visit after commencing MLD. Cont as per POC.  OBJECTIVE IMPAIRMENTS: decreased knowledge of condition, decreased knowledge of use of DME, chronic progressive BLE swelling, R>L , LE associated pain and skin changes,   ACTIVITY LIMITATIONS: gravity dependent sitting, standing and extended walking exacerbate limb swelling and associated pain limiting functional ambulation and mobility; limb swelling limits Pt's ability to fit street shoes and socks; BLE LE and pain limits social participation; LE limits body image, and  "gets me down" causing feelings of anger, depression or frustration.   PARTICIPATION LIMITATIONS: painting hobby, social participation in community activities requiring extended sitting, standing, walking  PERSONAL FACTORS: Time since onset of injury/illness/exacerbation and 1-2 comorbidities: (Ca hx w XRT, HTN)  are also affecting patient's functional outcome.   REHAB POTENTIAL: Good    GOALS: Goals reviewed with patient? Yes  SHORT TERM GOALS: Target date: 4th OT Rx visit  Pt will demonstrate understanding of lymphedema precautions and prevention strategies with modified independence using a printed reference to identify at least 5 precautions and discussing how s/he may implement them into daily life to reduce risk of progression with extra time. Baseline:Max A Goal status: INITIAL  2.  Pt will be able to apply multilayer, thigh length, gradient, compression wraps to one leg at a time with modified assistance (extra time) to decrease limb volume, to limit infection risk, and to limit lymphedema progression.  Baseline: Max  A Goal status: INITIAL   LONG TERM GOALS: Target date: 06/20/23  Given this patient's Intake score of TBA/100% on the functional outcomes FOTO tool, patient will experience an increase in function of 3 points to improve basic and instrumental ADLs performance, including lymphedema self-care.  Baseline: TBA Goal status: INITIAL  2.  Given this patient's Intake score of 8.82 % on the Lymphedema Life Impact Scale (LLIS), patient will experience a reduction of at least 5 points in her perceived level of functional impairment resulting from lymphedema to improve functional performance and quality of life (QOL). Baseline: 8.82% Goal status: INITIAL  3.   During Intensive phase CDT Pt will achieve at least 85% compliance with all lymphedema self-care home program components, including daily skin care, compression wraps and /or garments, simple self MLD and lymphatic pumping therex to habituate LE self care protocol  into ADLs for optimal LE self-management over time. Baseline: Max A Goal status: INITIAL  4.  Pt will achieve at least a 10% volume reduction in the LLE to return limb to typical size and shape, to limit infection risk and LE progression, to decrease pain, to improve function. Baseline: Dependent Goal status: INITIAL  5.  Pt will obtain proper, recommended compression garments/devices and be modified independent with donning/doffing to optimize limb volume reduction and limit LE  progression over time. Baseline:  Goal status: INITIAL  PLAN:  PT FREQUENCY: 2x/week and PRN  PT DURATION: 12 weeks and PRN  PLANNED INTERVENTIONS:  Complete Decongestive Therapy: Therapeutic lymphatic pumping exercises, Manual lymph drainage (MLD), Multilayer, gradient Compression bandaging, Manual therapy, skin care to limit infection risk, scar massage; During self management phase Pt will be fit  with appropriate compression garments and devices,  Therapeutic activities Patient/Family education for  LE Self Care home program  PLAN FOR NEXT SESSION:  LLE knee length multilayer compression wraps Commence LE self care home program edu  Loel Dubonnet, MS, OTR/L, CLT-LANA 03/29/23 3:40 PM

## 2023-04-01 ENCOUNTER — Encounter (INDEPENDENT_AMBULATORY_CARE_PROVIDER_SITE_OTHER): Payer: Self-pay | Admitting: Vascular Surgery

## 2023-04-03 ENCOUNTER — Ambulatory Visit: Payer: Medicare PPO | Admitting: Occupational Therapy

## 2023-04-03 DIAGNOSIS — I890 Lymphedema, not elsewhere classified: Secondary | ICD-10-CM | POA: Diagnosis not present

## 2023-04-04 NOTE — Therapy (Signed)
OUTPATIENT OCCUPATIONAL THERAPY EVALUATION  LOWER EXTREMITY LYMPHEDEMA  Patient Name: Barbara Burgess MRN: [id number] DOB:Jul 26, 1946, 77 y.o., female Today's Date: 04/04/2023  END OF SESSION:   OT End of Session - 04/03/23 1015     Visit Number 3    Number of Visits 36    Date for OT Re-Evaluation 06/19/23    OT Start Time 0200    OT Stop Time 0305    OT Time Calculation (min) 65 min    Activity Tolerance Patient tolerated treatment well;No increased pain    Behavior During Therapy WFL for tasks assessed/performed               Past Medical History:  Diagnosis Date   Arthritis    knee and hip,  per [facility].    Bilateral bunions    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HCC)     per [facility].    Colitis    H/O CT scan of chest 11/27/2018   By Roland Rack, per [facility].    H/O mammogram 11/27/2018   By Pearlie Oyster at [facility], per [facility] New Patient Packet.    History of CT scan of abdomen 11/27/2018   By Roland Rack, per [facility] Packet.    History of upper extremity x-ray 11/27/2018   By Lonny Prude, of Shoulder, per [facility] Patient Packet.    History of x-ray of hip 11/27/2018   By Lonny Prude, of Hip, per [facility] Patient Packet.    Hyperlipemia     per [facility].    Hypertension     per [facility] Packet.    Impingement syndrome of shoulder     per [facility] Packet.    Incontinence    Mild Nighttime per [facility] Patient Packet.   Insomnia    Melanoma (HCC)    right lower leg; right ovary   Osteoarthritis of multiple joints    Rosacea     per [facility] Patient Packet.    Sleep disorder    Past Surgical History:  Procedure Laterality Date   ABDOMINAL HYSTERECTOMY  11/28/1987   Partial per [facility]   ARTHROSCOPIC REPAIR ACL Left 11/28/2003   Left Knee/ Torn Medial Meniscus per [facility] Patient Packet   BREAST BIOPSY     CATARACT EXTRACTION W/ INTRAOCULAR LENS  IMPLANT Bilateral    COLONOSCOPY  11/27/2016   By Liberty Handy,  per [facility].    COLONOSCOPY  11/27/2017   By Liberty Handy, per [facility] Patient Packet.    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   Procedure: TOTAL KNEE ARTHROPLASTY;  Surgeon: Juanell Fairly, MD;  Location: [facility] ORS;  Service: Orthopedics;  Laterality: Left;   TUBAL LIGATION  1976   Patient Active Problem List   Diagnosis Date Noted   Chronic venous insufficiency 03/25/2023   Lymphedema 12/24/2022   S/P TKR (total knee replacement) using cement, 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 Sampson Goon, MD  REFERRING PROVIDER: Sheppard Plumber, NP  REFERRING DIAG: I89.0  THERAPY DIAG:  Lymphedema, not elsewhere classified  Rationale for Evaluation and Treatment: Rehabilitation  ONSET DATE: Onset of intermittent of RLE s/p XRT for R melanoma recurrence in 2012  SUBJECTIVE:                                                                                                                                                                                           SUBJECTIVE STATEMENT:Barbara Burgess presents to  OT for modified  Intensive Phase CDT to cancer-related  BLE lymphedema, R>L. Pt  reports 2/10 pain at anterior ankles where existing compression pantyhose are  bunching. Pt has no other new complaints.  PERTINENT HISTORY:   relevant to LE: Takes NORVASC, HTN, L knee OA, hysterectomy, 1976 Tubal ligation, former smoker;  Oncology History 09/05/2011 Exploratory laparotomy and debulking of right pelvic sidewall mass.~ 2. Bilateral salpingo-oophorectomy.~ 3. Right pelvic lymph node sampling.~~~; Path: Right pelvic sidewall mass and right fallopian tube and ovary, excision; - Metastatic malignant melanoma involving matted lymph nodes with extracapsular; extension, size: 9.0 x 7.5 x 4.5 cm; ; Braf neg;  Stage III or stage IV melanoma (we cannot tell if this is a locally advanced stage III cancer or stage IV) of the right lower leg with metastasis to the right pelvic lymph nodes and ovaries.  Fall 2012: Radiation therapy L groin and LLQ  PAIN:  Are you having pain? Yes: NPRS scale: 4/10 Pain location: BLE, R>L Pain description: heavy, tight, full, neuropathic pain on top of R foot Aggravating factors: dependent sitting, standing, walking, hot weather Relieving factors: compression, elevation  PRECAUTIONS: Other: Lymphedema precautions; Hx melanoma  HAND DOMINANCE: right   PRIOR LEVEL OF FUNCTION: Independent  PATIENT GOALS: Learn how to best manage my lymphedema to keep it from getting worse   OBJECTIVE:  OBSERVATIONS / OTHER ASSESSMENTS:  Moderate, Stage  II, Cancer-related, Left Lower Extremity/ Left Lower Quadrant Lymphedema   FOTO functional outcome measure: TBA at first OT Rx visit  Lymphedema Life Impact Scale (LLIS): 8.82% (The extent to which LE-related problems affected your life in the last week)   BLE COMPARATIVE LIMB VOLUMETRICS: Initial 03/29/23  LANDMARK RIGHT    R LEG (A-D) 3644.0 ml  R THIGH (E-G) 6111.3 ml  R FULL LIMB (A-G) 9755.2  ml  Limb Volume differential (LVD)  %  Volume change since initial %  Volume change overall V  (Blank rows = not tested)  LANDMARK LEFT   L LEG (A-D) 4076.7 ml  L THIGH (E-G) 6365.5 ml   L FULL LIMB (A-G) 10443.15 ml  Limb Volume differential (LVD)  LEG LVD= 12.9%, L>R Thigh LVD =4.2 %. L>R Full LIMB LVD = 7.05%, L>R  Volume change since initial %  Volume change overall %  (Blank rows = not tested)     TODAY'S TREATMENT:       1.RLE/RLQ MLD in supine and side lying  2. PATIENT EDUCATION: Pt edu for simple self-MLD and multilayer compression bandaging Person educated: Patient Education method: Explanation, Demonstration, Tactile cues, and Handouts Education comprehension: verbalized understanding, returned demonstration, verbal cues required, and needs further education  LYMPHEDEMA SELF-CARE HOME PROGRAM: all to be performed daily and ongoing as LE is a chronic, progressive condition with no cure RLE/RLQ Simple Self-Manual Lymphatic Drainage (MLD): In supine and side lying utilizing bilateral short neck sequence, deep abdominal pathways via diaphragmatic breathing, ipsilateral axillary inguinal  anastomosis, then inguinal LN, and dynamic J strokes from proximal to distal staring at groin and thigh, then knee, then leg segment, and finally  retrograde sweeps back to terminus. Skin care to limit infection risk and facilitate essential skin excursion or optimal lymphatic function Lymphatic Pumping there ex- 2 x daily, in sequence, 10 reps bilaterally. Hole 5 seconds each Multilayer compression wrapping: 1 each 8 and 10 cm wide short stretch bandage applied  using overlapping, gradient techniques starting at base of the and ending at popliteal fossa. Bandages applied over a single layer of 0.4 cm thick Rosidal foam and cotton stockinett underneath all. During Intensive Phase CDT patients undergo short stretch bandaging using gradient techniques one limb at a time, toes to groin During Self-management phase of CDT Pt's utilize appropriate compression garments and HOS devices that meet their individual needs, abilities and lifestyle.   Custom-made gradient compression garments and  HOS devices are medically necessary in this case because they are uniquely sized and shaped to fit the exact dimensions of the affected extremities with deformities, and to provide accurate and consistent gradient compression and containment, essential to optimally managing this patient's symptoms of chronic, progressive lymphedema. Multiple custom compression garments are needed for optimal hygiene to limit infection risk. Custom compression garments should be replaced q 3-6 months When worn consistently for optimal lipo-lymphedema self-management over time.  ASSESSMENT: CLINICAL IMPRESSION: Pt tolerated RLE/RLQ MLD without increased pain.  Completed full sequence in supine and side lying. Pt educated for lymphatic map and  structure arrangement in relation to MLD. Pt taught J stroke and diaphragmatic breathing. Also provided entry level teaching for gradient compression wrapping. Good return, but needs additional edu. Applied knee length , multilayer compression wrap using gradient techniques. Pt instructed to remove if she experiences onset of acute pain or atypical SOB. Cont as per POC.  OBJECTIVE IMPAIRMENTS: decreased knowledge of condition, decreased knowledge of use of DME, chronic progressive BLE swelling, R>L , LE associated pain and skin changes,   ACTIVITY LIMITATIONS: gravity dependent sitting, standing and extended walking exacerbate limb swelling and associated pain limiting functional ambulation and mobility; limb swelling limits Pt's ability to fit street shoes and socks; BLE LE and pain limits social participation; LE limits body image, and  "gets me down" causing feelings of anger, depression or frustration.   PARTICIPATION LIMITATIONS: painting hobby, social participation in community activities  requiring extended sitting, standing, walking  PERSONAL FACTORS: Time since onset of injury/illness/exacerbation and 1-2 comorbidities: (Ca hx w XRT, HTN)  are also affecting patient's  functional outcome.   REHAB POTENTIAL: Good    GOALS: Goals reviewed with patient? Yes  SHORT TERM GOALS: Target date: 4th OT Rx visit  Pt will demonstrate understanding of lymphedema precautions and prevention strategies with modified independence using a printed reference to identify at least 5 precautions and discussing how s/he may implement them into daily life to reduce risk of progression with extra time. Baseline:Max A Goal status: INITIAL  2.  Pt will be able to apply multilayer, thigh length, gradient, compression wraps to one leg at a time with modified assistance (extra time) to decrease limb volume, to limit infection risk, and to limit lymphedema progression.  Baseline: Max A Goal status: INITIAL   LONG TERM GOALS: Target date: 06/20/23  Given this patient's Intake score of TBA/100% on the functional outcomes FOTO tool, patient will experience an increase in function of 3 points to improve basic and instrumental ADLs performance, including lymphedema self-care.  Baseline: TBA Goal status: INITIAL  2.  Given this patient's Intake score of 8.82 % on the Lymphedema Life Impact Scale (LLIS), patient will experience a reduction of at least 5 points in her perceived level of functional impairment resulting from lymphedema to improve functional performance and quality of life (QOL). Baseline: 8.82% Goal status: INITIAL  3.   During Intensive phase CDT Pt will achieve at least 85% compliance with all lymphedema self-care home program components, including daily skin care, compression wraps and /or garments, simple self MLD and lymphatic pumping therex to habituate LE self care protocol  into ADLs for optimal LE self-management over time. Baseline: Max A Goal status: INITIAL  4.  Pt will achieve at least a 10% volume reduction in the LLE to return limb to typical size and shape, to limit infection risk and LE progression, to decrease pain, to improve function. Baseline:  Dependent Goal status: INITIAL  5.  Pt will obtain proper, recommended compression garments/devices and be modified independent with donning/doffing to optimize limb volume reduction and limit LE  progression over time. Baseline:  Goal status: INITIAL  PLAN:  PT FREQUENCY: 2x/week and PRN  PT DURATION: 12 weeks and PRN  PLANNED INTERVENTIONS:  Complete Decongestive Therapy: Therapeutic lymphatic pumping exercises, Manual lymph drainage (MLD), Multilayer, gradient Compression bandaging, Manual therapy, skin care to limit infection risk, scar massage; During self management phase Pt will be fit with appropriate compression garments and devices,  Therapeutic activities Patient/Family education for LE Self Care home program  PLAN FOR NEXT SESSION:  LLE knee length multilayer compression wraps Continue LE self care home program edu- wrapping and simple self-mld  Loel Dubonnet, MS, OTR/L, CLT-LANA 04/04/23 10:17 AM

## 2023-04-05 ENCOUNTER — Ambulatory Visit: Payer: Medicare PPO | Admitting: Occupational Therapy

## 2023-04-05 DIAGNOSIS — I890 Lymphedema, not elsewhere classified: Secondary | ICD-10-CM | POA: Diagnosis not present

## 2023-04-05 NOTE — Therapy (Signed)
OUTPATIENT OCCUPATIONAL THERAPY TREATMENT NOTE  LOWER EXTREMITY LYMPHEDEMA  Patient Name: Barbara Burgess MRN: [id number] DOB:Aug 25, 1946, 77 y.o., female Today's Date: 04/06/2023  END OF SESSION:   OT End of Session - 04/05/23 1312     Visit Number 4    Number of Visits 36    Date for OT Re-Evaluation 06/19/23    OT Start Time 0105    OT Stop Time 0205    OT Time Calculation (min) 60 min    Activity Tolerance Patient tolerated treatment well;No increased pain    Behavior During Therapy WFL for tasks assessed/performed               Past Medical History:  Diagnosis Date   Arthritis    knee and hip,  per [facility].    Bilateral bunions    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HCC)     per [facility].    Colitis    H/O CT scan of chest 11/27/2018   By Roland Rack, per [facility] Patient Packet.    H/O mammogram 11/27/2018   By Pearlie Oyster at [facility], per [facility] New Patient Packet.    History of CT scan of abdomen 11/27/2018   By Roland Rack, per [facility] Patient Packet.    History of upper extremity x-ray 11/27/2018   By Lonny Prude, of Shoulder, per [facility] Patient Packet.    History of x-ray of hip 11/27/2018   By Lonny Prude, of Hip, per [facility] Patient Packet.    Hyperlipemia     per [facility].    Hypertension     per [facility] Packet.    Impingement syndrome of shoulder     per [facility] Patient Packet.    Incontinence    Mild Nighttime per [facility] Patient Packet.   Insomnia    Melanoma (HCC)    right lower leg; right ovary   Osteoarthritis of multiple joints    Rosacea     per [facility] Patient Packet.    Sleep disorder    Past Surgical History:  Procedure Laterality Date   ABDOMINAL HYSTERECTOMY  11/28/1987   Partial per [facility]   ARTHROSCOPIC REPAIR ACL Left 11/28/2003   Left Knee/ Torn Medial Meniscus per [facility] Patient Packet   BREAST BIOPSY     CATARACT EXTRACTION W/ INTRAOCULAR  LENS IMPLANT Bilateral    COLONOSCOPY  11/27/2016   By Liberty Handy,  per [facility] Packet.    COLONOSCOPY  11/27/2017   By Liberty Handy, per [facility] Patient Packet.    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   Procedure: TOTAL KNEE ARTHROPLASTY;  Surgeon: Juanell Fairly, MD;  Location: [facility] ORS;  Service: Orthopedics;  Laterality: Left;   TUBAL LIGATION  1976   Patient Active Problem List   Diagnosis Date Noted   Chronic venous insufficiency 03/25/2023   Lymphedema 12/24/2022   S/P TKR (total knee replacement) using cement, left 12/20/2021   Sesamoiditis 05/06/2020   Hav (hallux abducto valgus), unspecified laterality 01/08/2020   Porokeratosis 01/08/2020   Insomnia  Rosacea    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HCC)    History of melanoma    Hypertension    Hyperlipemia    Osteoarthritis of multiple joints    Arthritis of knee, left 07/14/2016   Foot pain 03/17/2013   Melanoma of skin (HCC) 09/13/2011    PCP: Stann Mainland. Sampson Goon, MD  REFERRING PROVIDER: Sheppard Plumber, NP  REFERRING DIAG: I89.0  THERAPY DIAG:  Lymphedema, not elsewhere classified  Rationale for Evaluation and Treatment: Rehabilitation  ONSET DATE: Onset of intermittent of RLE s/p XRT for R melanoma recurrence in 2012  SUBJECTIVE:                                                                                                                                                                                           SUBJECTIVE STATEMENT:Susan Harmes presents to  OT for modified  Intensive Phase CDT to cancer-related  BLE lymphedema, R>L. Pt  reports she tolerated knee length, RLE, compression wraps during visit  interval without increased pain. She reports she noticed a decrease in limb size after removing wraps. Pt does not rate LE-related leg pain numerically today.  PERTINENT HISTORY:   relevant to LE: Takes NORVASC, HTN, L knee OA, hysterectomy, 1976 Tubal ligation, former smoker;  Oncology History 09/05/2011 Exploratory laparotomy and debulking of right pelvic sidewall mass.~ 2. Bilateral salpingo-oophorectomy.~ 3. Right pelvic lymph node sampling.~~~; Path: Right pelvic sidewall mass and right fallopian tube and ovary, excision; - Metastatic malignant melanoma involving matted lymph nodes with extracapsular; extension, size: 9.0 x 7.5 x 4.5 cm; ; Braf neg;  Stage III or stage IV melanoma (we cannot tell if this is a locally advanced stage III cancer or stage IV) of the right lower leg with metastasis to the right pelvic lymph nodes and ovaries.  Fall 2012: Radiation therapy L groin and LLQ  PAIN:  Are you having pain? Yes: NPRS scale: not rated/10 Pain location: BLE, R>L Pain description: heavy, tight, full, neuropathic pain on top of R foot Aggravating factors: dependent sitting, standing, walking, hot weather Relieving factors: compression, elevation  PRECAUTIONS: Other: Lymphedema precautions; Hx melanoma  HAND DOMINANCE: right   PRIOR LEVEL OF FUNCTION: Independent  PATIENT GOALS: Learn how to best manage my lymphedema to keep it from getting worse   OBJECTIVE:  OBSERVATIONS / OTHER ASSESSMENTS:  Moderate, Stage  II, Cancer-related, Left Lower Extremity/ Left Lower Quadrant Lymphedema   FOTO functional outcome measure: TBA at first OT Rx visit  Lymphedema Life Impact Scale (LLIS): 8.82% (The extent to which LE-related problems affected your life in the last week)   BLE COMPARATIVE LIMB VOLUMETRICS: Initial 03/29/23  LANDMARK RIGHT    R LEG (A-D) 3644.0 ml  R THIGH (E-G) 6111.3 ml  R FULL LIMB (A-G) 9755.2 ml  Limb Volume differential (LVD)  %  Volume change since initial  %  Volume change overall V  (Blank rows = not tested)  LANDMARK LEFT   L LEG (A-D) 4076.7 ml  L THIGH (E-G) 6365.5 ml  L FULL LIMB (A-G) 10443.15 ml  Limb Volume differential (LVD)  LEG LVD= 12.9%, L>R Thigh LVD =4.2 %. L>R Full LIMB LVD = 7.05%, L>R  Volume change since initial %  Volume change overall %  (Blank rows = not tested)     TODAY'S TREATMENT:      . PATIENT EDUCATION: Pt edu for LE self-care home program:  multilayer compression bandaging Person educated: Patient Education method: Programmer, multimedia, Demonstration, Verbal and Tactile cues, and Handouts Education comprehension: verbalized understanding, returned demonstration, verbal cues required, and needs further education  LYMPHEDEMA SELF-CARE HOME PROGRAM: all to be performed daily and ongoing as LE is a chronic, progressive condition with no cure RLE/RLQ Simple Self-Manual Lymphatic Drainage (MLD): In supine and side lying utilizing bilateral short neck sequence, deep abdominal pathways via diaphragmatic breathing, ipsilateral axillary inguinal  anastomosis, then inguinal LN, and dynamic J strokes from proximal to distal staring at groin and thigh, then knee, then leg segment, and finally  retrograde sweeps back to terminus. Skin care to limit infection risk and facilitate essential skin excursion or optimal lymphatic function Lymphatic Pumping there ex- 2 x daily, in sequence, 10 reps bilaterally. Hole 5 seconds each Multilayer compression wrapping: 1 each 8 and 10 cm wide short stretch bandage applied  using overlapping, gradient techniques starting at base of the and ending at popliteal fossa. Bandages applied over a single layer of 0.4 cm thick Rosidal foam and cotton stockinett underneath all. During Intensive Phase CDT patients undergo short stretch bandaging using gradient techniques one limb at a time, toes to groin During Self-management phase of CDT Pt's utilize appropriate compression garments and HOS devices that  meet their individual needs, abilities and lifestyle.   Custom-made gradient compression garments and HOS devices are medically necessary in this case because they are uniquely sized and shaped to fit the exact dimensions of the affected extremities with deformities, and to provide accurate and consistent gradient compression and containment, essential to optimally managing this patient's symptoms of chronic, progressive lymphedema. Multiple custom compression garments are needed for optimal hygiene to limit infection risk. Custom compression garments should be replaced q 3-6 months When worn consistently for optimal lipo-lymphedema self-management over time.  ASSESSMENT: CLINICAL IMPRESSION: Emphasis of visit on Pt edu for compression bandaging. After skilled teaching and multiple opportunities to practice, Pt is able to apply multilayer, short stretch wraps from toes to popliteal fossa using correct gradient techniques with minium assistance. Pt will continue wrapping daily during visit interval. Productive session. Cont as per POC.   OBJECTIVE IMPAIRMENTS: decreased knowledge of condition, decreased knowledge of use of DME, chronic progressive BLE swelling, R>L , LE associated pain and skin changes,   ACTIVITY LIMITATIONS: gravity dependent sitting, standing and extended walking exacerbate limb swelling and associated pain limiting functional ambulation and mobility; limb swelling limits Pt's ability to fit street shoes and socks; BLE LE and pain limits social participation; LE limits body image, and  "gets me down" causing feelings of anger, depression or frustration.   PARTICIPATION LIMITATIONS: painting hobby, social participation in community activities requiring extended sitting, standing, walking  PERSONAL FACTORS: Time  since onset of injury/illness/exacerbation and 1-2 comorbidities: (Ca hx w XRT, HTN)  are also affecting patient's functional outcome.   REHAB POTENTIAL:  Good    GOALS: Goals reviewed with patient? Yes  SHORT TERM GOALS: Target date: 4th OT Rx visit  Pt will demonstrate understanding of lymphedema precautions and prevention strategies with modified independence using a printed reference to identify at least 5 precautions and discussing how s/he may implement them into daily life to reduce risk of progression with extra time. Baseline:Max A Goal status: INITIAL  2.  Pt will be able to apply multilayer, thigh length, gradient, compression wraps to one leg at a time with modified assistance (extra time) to decrease limb volume, to limit infection risk, and to limit lymphedema progression.  Baseline: Max A Goal status: INITIAL   LONG TERM GOALS: Target date: 06/20/23  Given this patient's Intake score of TBA/100% on the functional outcomes FOTO tool, patient will experience an increase in function of 3 points to improve basic and instrumental ADLs performance, including lymphedema self-care.  Baseline: TBA Goal status: INITIAL  2.  Given this patient's Intake score of 8.82 % on the Lymphedema Life Impact Scale (LLIS), patient will experience a reduction of at least 5 points in her perceived level of functional impairment resulting from lymphedema to improve functional performance and quality of life (QOL). Baseline: 8.82% Goal status: INITIAL  3.   During Intensive phase CDT Pt will achieve at least 85% compliance with all lymphedema self-care home program components, including daily skin care, compression wraps and /or garments, simple self MLD and lymphatic pumping therex to habituate LE self care protocol  into ADLs for optimal LE self-management over time. Baseline: Max A Goal status: INITIAL  4.  Pt will achieve at least a 10% volume reduction in the LLE to return limb to typical size and shape, to limit infection risk and LE progression, to decrease pain, to improve function. Baseline: Dependent Goal status: INITIAL  5.  Pt will  obtain proper, recommended compression garments/devices and be modified independent with donning/doffing to optimize limb volume reduction and limit LE  progression over time. Baseline:  Goal status: INITIAL  PLAN:  PT FREQUENCY: 2x/week and PRN  PT DURATION: 12 weeks and PRN  PLANNED INTERVENTIONS:  Complete Decongestive Therapy: Therapeutic lymphatic pumping exercises, Manual lymph drainage (MLD), Multilayer, gradient Compression bandaging, Manual therapy, skin care to limit infection risk, scar massage; During self management phase Pt will be fit with appropriate compression garments and devices,  Therapeutic activities Patient/Family education for LE Self Care home program  PLAN FOR NEXT SESSION:  LLE knee length multilayer compression wraps Continue LE self care home program edu- wrapping and simple self-mld MLD to RLE/RLQ  Loel Dubonnet, MS, OTR/L, CLT-LANA 04/06/23 9:13 AM

## 2023-04-11 ENCOUNTER — Ambulatory Visit: Payer: Medicare PPO | Admitting: Occupational Therapy

## 2023-04-11 DIAGNOSIS — I890 Lymphedema, not elsewhere classified: Secondary | ICD-10-CM

## 2023-04-11 NOTE — Therapy (Signed)
OUTPATIENT OCCUPATIONAL THERAPY TREATMENT NOTE  LOWER EXTREMITY LYMPHEDEMA  Patient Name: Barbara Burgess MRN: [id number] DOB:1946-02-13, 77 y.o., female Today's Date: 04/11/2023  END OF SESSION:   OT End of Session - 04/11/23 1501     Visit Number 5    Number of Visits 36    Date for OT Re-Evaluation 06/19/23    OT Start Time 1005    OT Stop Time 1120    OT Time Calculation (min) 75 min    Activity Tolerance Patient tolerated treatment well;No increased pain    Behavior During Therapy WFL for tasks assessed/performed               Past Medical History:  Diagnosis Date   Arthritis    knee and hip,  per [facility].    Bilateral bunions    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HCC)     per [facility] Patient Packet.    Colitis    H/O CT scan of chest 11/27/2018   By Barbara Burgess, per [facility] Patient Packet.    H/O mammogram 11/27/2018   By Barbara Burgess at [facility], per [facility] New Patient Packet.    History of CT scan of abdomen 11/27/2018   By Barbara Burgess, per [facility] Patient Packet.    History of upper extremity x-ray 11/27/2018   By Lonny Prude, of Shoulder, per [facility] Patient Packet.    History of x-ray of hip 11/27/2018   By Lonny Prude, of Hip, per [facility] Patient Packet.    Hyperlipemia     per [facility] Patient Packet.    Hypertension     per [facility] Patient Packet.    Impingement syndrome of shoulder     per [facility] Patient Packet.    Incontinence    Mild Nighttime per [facility] Patient Packet.   Insomnia    Melanoma (HCC)    right lower leg; right ovary   Osteoarthritis of multiple joints    Rosacea     per [facility] Patient Packet.    Sleep disorder    Past Surgical History:  Procedure Laterality Date   ABDOMINAL HYSTERECTOMY  11/28/1987   Partial per [facility] Packet   ARTHROSCOPIC REPAIR ACL Left 11/28/2003   Left Knee/ Torn Medial Meniscus per [facility] New Patient Packet   BREAST BIOPSY     CATARACT EXTRACTION W/ INTRAOCULAR  LENS IMPLANT Bilateral    COLONOSCOPY  11/27/2016   By Barbara Burgess,  per [facility] Patient Packet.    COLONOSCOPY  11/27/2017   By Barbara Burgess, per [facility] Patient Packet.    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   Procedure: TOTAL KNEE ARTHROPLASTY;  Surgeon: Barbara Fairly, MD;  Location: [facility] ORS;  Service: Orthopedics;  Laterality: Left;   TUBAL LIGATION  1976   Patient Active Problem List   Diagnosis Date Noted   Chronic venous insufficiency 03/25/2023   Lymphedema 12/24/2022   S/P TKR (total knee replacement) using cement, left 12/20/2021   Sesamoiditis 05/06/2020   Hav (hallux abducto valgus), unspecified laterality 01/08/2020   Porokeratosis 01/08/2020   Insomnia  Rosacea    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HCC)    History of melanoma    Hypertension    Hyperlipemia    Osteoarthritis of multiple joints    Arthritis of knee, left 07/14/2016   Foot pain 03/17/2013   Melanoma of skin (HCC) 09/13/2011    PCP: Barbara Mainland. Sampson Goon, MD  REFERRING PROVIDER: Sheppard Plumber, NP  REFERRING DIAG: I89.0  THERAPY DIAG:  Lymphedema, not elsewhere classified  Rationale for Evaluation and Treatment: Rehabilitation  ONSET DATE: Onset of intermittent of RLE s/p XRT for R melanoma recurrence in 2012  SUBJECTIVE:                                                                                                                                                                                           SUBJECTIVE STATEMENT:Barbara Burgess presents to  OT for modified  Intensive Phase CDT to cancer-related  BLE lymphedema, R>L. Pt  reports she tolerated knee length, RLE, compression wraps during visit  interval without increased pain. Pt states LE self management is taking so much of her time she decided to stop swimming for exercise 5 x weekly. She reports she took a local trip on a bus and saw a play recently and her feet and legs both swelling noticeably. Pt does not rate LE-related leg pain numerically today.  PERTINENT HISTORY:   relevant to LE: Takes NORVASC, HTN, L knee OA, hysterectomy, 1976 Tubal ligation, former smoker;  Oncology History 09/05/2011 Exploratory laparotomy and debulking of right pelvic sidewall mass.~ 2. Bilateral salpingo-oophorectomy.~ 3. Right pelvic lymph node sampling.~~~; Path: Right pelvic sidewall mass and right fallopian tube and ovary, excision; - Metastatic malignant melanoma involving matted lymph nodes with extracapsular; extension, size: 9.0 x 7.5 x 4.5 cm; ; Braf neg;  Stage III or stage IV melanoma (we cannot tell if this is a locally advanced stage III cancer or stage IV) of the right lower leg with metastasis to the right pelvic lymph nodes and ovaries.  Fall 2012: Radiation therapy L groin and LLQ  PAIN:  Are you having pain? Yes: NPRS scale: not rated/10 Pain location: BLE, R>L Pain description: heavy, tight, full, neuropathic pain on top of R foot Aggravating factors: dependent sitting, standing, walking, hot weather Relieving factors: compression, elevation  PRECAUTIONS: Other: Lymphedema precautions; Hx melanoma  HAND DOMINANCE: right   PRIOR LEVEL OF FUNCTION: Independent  PATIENT GOALS: Learn how to best manage my lymphedema to keep it from getting worse   OBJECTIVE:  OBSERVATIONS / OTHER ASSESSMENTS:  Moderate, Stage  II, Cancer-related, Left Lower Extremity/ Left Lower Quadrant Lymphedema   FOTO functional outcome measure: TBA at  first OT Rx visit  Lymphedema Life Impact Scale (LLIS): 8.82% (The extent to which LE-related problems affected your life in the last week)   BLE COMPARATIVE LIMB VOLUMETRICS: Initial  03/29/23  LANDMARK RIGHT    R LEG (A-D) 3644.0 ml  R THIGH (E-G) 6111.3 ml  R FULL LIMB (A-G) 9755.2 ml  Limb Volume differential (LVD)  %  Volume change since initial %  Volume change overall V  (Blank rows = not tested)  LANDMARK LEFT   L LEG (A-D) 4076.7 ml  L THIGH (E-G) 6365.5 ml  L FULL LIMB (A-G) 10443.15 ml  Limb Volume differential (LVD)  LEG LVD= 12.9%, L>R Thigh LVD =4.2 %. L>R Full LIMB LVD = 7.05%, L>R  Volume change since initial %  Volume change overall %  (Blank rows = not tested)     TODAY'S TREATMENT:      . PATIENT EDUCATION: Pt edu for LE self-care home program:  multilayer compression bandaging; simple self MLD Person educated: Patient Education method: Programmer, multimedia, Demonstration, Verbal and Tactile cues, and Handouts Education comprehension: verbalized understanding, returned demonstration, verbal cues required, and needs further education  LYMPHEDEMA SELF-CARE HOME PROGRAM: all to be performed daily and ongoing as LE is a chronic, progressive condition with no cure RLE/RLQ Simple Self-Manual Lymphatic Drainage (MLD): In supine and side lying utilizing bilateral short neck sequence, deep abdominal pathways via diaphragmatic breathing, ipsilateral axillary inguinal  anastomosis, then inguinal LN, and dynamic J strokes from proximal to distal staring at groin and thigh, then knee, then leg segment, and finally  retrograde sweeps back to terminus. Skin care to limit infection risk and facilitate essential skin excursion or optimal lymphatic function Lymphatic Pumping there ex- 2 x daily, in sequence, 10 reps bilaterally. Hole 5 seconds each Multilayer compression wrapping: 1 each 8 and 10 cm wide short stretch bandage applied  using overlapping, gradient techniques starting at base of the and ending at popliteal fossa. Bandages applied over a single layer of 0.4 cm thick Rosidal foam and cotton stockinett underneath all. During Intensive Phase CDT patients undergo  short stretch bandaging using gradient techniques one limb at a time, toes to groin During Self-management phase of CDT Pt's utilize appropriate compression garments and HOS devices that meet their individual needs, abilities and lifestyle.   Custom-made gradient compression garments and HOS devices are medically necessary in this case because they are uniquely sized and shaped to fit the exact dimensions of the affected extremities with deformities, and to provide accurate and consistent gradient compression and containment, essential to optimally managing this patient's symptoms of chronic, progressive lymphedema. Multiple custom compression garments are needed for optimal hygiene to limit infection risk. Custom compression garments should be replaced q 3-6 months When worn consistently for optimal lipo-lymphedema self-management over time.  ASSESSMENT: CLINICAL IMPRESSION: Pt and OT discussed plan for exercise     deciding that swimming is so very beneficial to lymphatic pumping that it would be detrimental    to her to quite all swimming for exercise until CDT is completed. Pt and OT compromised the plan and Pt agrees to keep swimming at least 2 x weekly, and maybe more, in conjunction with Intensive Phase CDT. B lower extremities are mildly swollen today. Remainder of session focused on MLE to RLE and RLQ in supine and side lying. Pt edu re lymphatic map in context of MLD . Productive session. Cont as per POC.   OBJECTIVE IMPAIRMENTS: decreased knowledge of condition, decreased knowledge of use of  DME, chronic progressive BLE swelling, R>L , LE associated pain and skin changes,   ACTIVITY LIMITATIONS: gravity dependent sitting, standing and extended walking exacerbate limb swelling and associated pain limiting functional ambulation and mobility; limb swelling limits Pt's ability to fit street shoes and socks; BLE LE and pain limits social participation; LE limits body image, and  "gets me down"  causing feelings of anger, depression or frustration.   PARTICIPATION LIMITATIONS: painting hobby, social participation in community activities requiring extended sitting, standing, walking  PERSONAL FACTORS: Time since onset of injury/illness/exacerbation and 1-2 comorbidities: (Ca hx w XRT, HTN)  are also affecting patient's functional outcome.   REHAB POTENTIAL: Good    GOALS: Goals reviewed with patient? Yes  SHORT TERM GOALS: Target date: 4th OT Rx visit  Pt will demonstrate understanding of lymphedema precautions and prevention strategies with modified independence using a printed reference to identify at least 5 precautions and discussing how s/he may implement them into daily life to reduce risk of progression with extra time. Baseline:Max A Goal status: INITIAL  2.  Pt will be able to apply multilayer, thigh length, gradient, compression wraps to one leg at a time with modified assistance (extra time) to decrease limb volume, to limit infection risk, and to limit lymphedema progression.  Baseline: Max A Goal status: INITIAL   LONG TERM GOALS: Target date: 06/20/23  Given this patient's Intake score of TBA/100% on the functional outcomes FOTO tool, patient will experience an increase in function of 3 points to improve basic and instrumental ADLs performance, including lymphedema self-care.  Baseline: TBA Goal status: INITIAL  2.  Given this patient's Intake score of 8.82 % on the Lymphedema Life Impact Scale (LLIS), patient will experience a reduction of at least 5 points in her perceived level of functional impairment resulting from lymphedema to improve functional performance and quality of life (QOL). Baseline: 8.82% Goal status: INITIAL  3.   During Intensive phase CDT Pt will achieve at least 85% compliance with all lymphedema self-care home program components, including daily skin care, compression wraps and /or garments, simple self MLD and lymphatic pumping therex to  habituate LE self care protocol  into ADLs for optimal LE self-management over time. Baseline: Max A Goal status: INITIAL  4.  Pt will achieve at least a 10% volume reduction in the LLE to return limb to typical size and shape, to limit infection risk and LE progression, to decrease pain, to improve function. Baseline: Dependent Goal status: INITIAL  5.  Pt will obtain proper, recommended compression garments/devices and be modified independent with donning/doffing to optimize limb volume reduction and limit LE  progression over time. Baseline:  Goal status: INITIAL  PLAN:  PT FREQUENCY: 2x/week and PRN  PT DURATION: 12 weeks and PRN  PLANNED INTERVENTIONS:  Complete Decongestive Therapy: Therapeutic lymphatic pumping exercises, Manual lymph drainage (MLD), Multilayer, gradient Compression bandaging, Manual therapy, skin care to limit infection risk, scar massage; During self management phase Pt will be fit with appropriate compression garments and devices,  Therapeutic activities Patient/Family education for LE Self Care home program  PLAN FOR NEXT SESSION:  LLE knee length multilayer compression wraps Continue LE self care home program edu- wrapping and simple self-mld MLD to RLE/RLQ  Loel Dubonnet, MS, OTR/L, CLT-LANA 04/11/23 3:04 PM

## 2023-04-16 ENCOUNTER — Ambulatory Visit: Payer: Medicare PPO | Admitting: Occupational Therapy

## 2023-04-16 ENCOUNTER — Encounter: Payer: Self-pay | Admitting: Occupational Therapy

## 2023-04-16 DIAGNOSIS — I890 Lymphedema, not elsewhere classified: Secondary | ICD-10-CM | POA: Diagnosis not present

## 2023-04-16 NOTE — Therapy (Signed)
OUTPATIENT OCCUPATIONAL THERAPY TREATMENT NOTE  LOWER EXTREMITY LYMPHEDEMA  Patient Name: MYLEA PENDELTON MRN: [id number] DOB:02/05/1946, 77 y.o., female Today's Date: 04/16/2023  END OF SESSION:   OT End of Session - 04/16/23 1357     Visit Number 6    Number of Visits 36    Date for OT Re-Evaluation 06/19/23    OT Start Time 1110    OT Stop Time 1220    OT Time Calculation (min) 70 min    Activity Tolerance Patient tolerated treatment well;No increased pain    Behavior During Therapy WFL for tasks assessed/performed               Past Medical History:  Diagnosis Date   Arthritis    knee and hip,  per [facility].    Bilateral bunions    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HCC)     per [facility] Patient [facility].    Colitis    H/O CT scan of chest 11/27/2018   By Roland Rack, per [facility] Patient Packet.    H/O mammogram 11/27/2018   By Pearlie Oyster at [facility], per [facility] New Patient Packet.    History of CT scan of abdomen 11/27/2018   By Roland Rack, per [facility] Patient Packet.    History of upper extremity x-ray 11/27/2018   By Lonny Prude, of Shoulder, per [facility] Patient Packet.    History of x-ray of hip 11/27/2018   By Lonny Prude, of Hip, per [facility] Patient Packet.    Hyperlipemia     per [facility] Patient Packet.    Hypertension     per [facility] Patient Packet.    Impingement syndrome of shoulder     per [facility] Patient Packet.    Incontinence    Mild Nighttime per [facility] Patient Packet.   Insomnia    Melanoma (HCC)    right lower leg; right ovary   Osteoarthritis of multiple joints    Rosacea     per [facility] Patient Packet.    Sleep disorder    Past Surgical History:  Procedure Laterality Date   ABDOMINAL HYSTERECTOMY  11/28/1987   Partial per [facility] Packet   ARTHROSCOPIC REPAIR ACL Left 11/28/2003   Left Knee/ Torn Medial Meniscus per [facility] New Patient Packet   BREAST BIOPSY     CATARACT EXTRACTION W/ INTRAOCULAR  LENS IMPLANT Bilateral    COLONOSCOPY  11/27/2016   By Liberty Handy,  per [facility] Patient Packet.    COLONOSCOPY  11/27/2017   By Liberty Handy, per [facility] Patient Packet.    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   Procedure: TOTAL KNEE ARTHROPLASTY;  Surgeon: Juanell Fairly, MD;  Location: [facility] ORS;  Service: Orthopedics;  Laterality: Left;   TUBAL LIGATION  1976   Patient Active Problem List   Diagnosis Date Noted   Chronic venous insufficiency 03/25/2023   Lymphedema 12/24/2022   S/P TKR (total knee replacement) using cement, left 12/20/2021   Sesamoiditis 05/06/2020   Hav (hallux abducto valgus), unspecified laterality 01/08/2020   Porokeratosis 01/08/2020   Insomnia  Rosacea    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HCC)    History of melanoma    Hypertension    Hyperlipemia    Osteoarthritis of multiple joints    Arthritis of knee, left 07/14/2016   Foot pain 03/17/2013   Melanoma of skin (HCC) 09/13/2011    PCP: Stann Mainland. Sampson Goon, MD  REFERRING PROVIDER: Sheppard Plumber, NP  REFERRING DIAG: I89.0  THERAPY DIAG:  Lymphedema, not elsewhere classified  Rationale for Evaluation and Treatment: Rehabilitation  ONSET DATE: Onset of intermittent of RLE s/p XRT for R melanoma recurrence in 2012  SUBJECTIVE:                                                                                                                                                                                           SUBJECTIVE STATEMENT:Susan Shepherd presents to  OT for modified  Intensive Phase CDT to cancer-related  BLE lymphedema, R>L. Pt  reports she tolerated knee length, RLE, compression wraps during visit  interval without increased pain. Pt states she resumed swimming 5 x weekly swimming for exercise . She used light duty , knee length compression stocking after pool and before coming to clinical session to conserve physical effort of re bandaging again. Pt also reports she called [employer] requesting how to obtain a Flexitouch device since her OT no longer has a manufacturer's rep. Person she spoke with on the phone said she would get that answer and call Pt with plan.Pt has no new complaints.  PERTINENT HISTORY:   relevant to LE: Takes NORVASC, HTN, L knee OA, hysterectomy, 1976 Tubal ligation, former smoker;  Oncology History 09/05/2011 Exploratory laparotomy and debulking of right pelvic sidewall mass.~ 2. Bilateral salpingo-oophorectomy.~ 3. Right pelvic lymph node sampling.~~~; Path: Right pelvic sidewall mass and right fallopian tube and ovary, excision; - Metastatic malignant melanoma involving matted lymph nodes with extracapsular; extension, size: 9.0 x 7.5 x 4.5 cm; ; Braf neg;  Stage III or stage IV melanoma (we cannot tell if this is a locally advanced stage III cancer or stage IV) of the right lower leg with metastasis to the right pelvic lymph nodes and ovaries.  Fall 2012: Radiation therapy L groin and LLQ  PAIN:  Are you having pain? Yes: NPRS scale: not rated/10 Pain location: BLE, R>L Pain description: heavy, tight, full, neuropathic pain on top of R foot Aggravating factors: dependent sitting, standing, walking, hot weather Relieving factors: compression, elevation  PRECAUTIONS: Other: Lymphedema precautions; Hx melanoma  HAND DOMINANCE: right   PRIOR LEVEL OF FUNCTION: Independent  PATIENT GOALS: Learn how to best manage my lymphedema to keep it from getting worse  OBJECTIVE:  OBSERVATIONS / OTHER ASSESSMENTS:  Moderate, Stage  II, Cancer-related, Left Lower Extremity/ Left Lower Quadrant Lymphedema   FOTO functional outcome measure: TBA at first OT Rx  visit  Lymphedema Life Impact Scale (LLIS): 8.82% (The extent to which LE-related problems affected your life in the last week)   BLE COMPARATIVE LIMB VOLUMETRICS: Initial 03/29/23  LANDMARK RIGHT    R LEG (A-D) 3644.0 ml  R THIGH (E-G) 6111.3 ml  R FULL LIMB (A-G) 9755.2 ml  Limb Volume differential (LVD)  %  Volume change since initial %  Volume change overall V  (Blank rows = not tested)  LANDMARK LEFT   L LEG (A-D) 4076.7 ml  L THIGH (E-G) 6365.5 ml  L FULL LIMB (A-G) 10443.15 ml  Limb Volume differential (LVD)  LEG LVD= 12.9%, L>R Thigh LVD =4.2 %. L>R Full LIMB LVD = 7.05%, L>R  Volume change since initial %  Volume change overall %  (Blank rows = not tested)     TODAY'S TREATMENT:      . PATIENT EDUCATION: Pt edu for LE self-care home program:  multilayer compression bandaging; simple self MLD Person educated: Patient Education method: Programmer, multimedia, Demonstration, Verbal and Tactile cues, and Handouts Education comprehension: verbalized understanding, returned demonstration, verbal cues required, and needs further education  LYMPHEDEMA SELF-CARE HOME PROGRAM: all to be performed daily and ongoing as LE is a chronic, progressive condition with no cure RLE/RLQ Simple Self-Manual Lymphatic Drainage (MLD): In supine and side lying utilizing bilateral short neck sequence, deep abdominal pathways via diaphragmatic breathing, ipsilateral axillary inguinal  anastomosis, then inguinal LN, and dynamic J strokes from proximal to distal staring at groin and thigh, then knee, then leg segment, and finally  retrograde sweeps back to terminus. Skin care to limit infection risk and facilitate essential skin excursion or optimal lymphatic function Lymphatic Pumping there ex- 2 x daily, in sequence, 10 reps bilaterally. Hole 5 seconds each Multilayer compression wrapping: 1 each 8 and 10 cm wide short stretch bandage applied  using overlapping, gradient techniques starting at base of the and  ending at popliteal fossa. Bandages applied over a single layer of 0.4 cm thick Rosidal foam and cotton stockinett underneath all. During Intensive Phase CDT patients undergo short stretch bandaging using gradient techniques one limb at a time, toes to groin During Self-management phase of CDT Pt's utilize appropriate compression garments and HOS devices that meet their individual needs, abilities and lifestyle.   Custom-made gradient compression garments and HOS devices are medically necessary in this case because they are uniquely sized and shaped to fit the exact dimensions of the affected extremities with deformities, and to provide accurate and consistent gradient compression and containment, essential to optimally managing this patient's symptoms of chronic, progressive lymphedema. Multiple custom compression garments are needed for optimal hygiene to limit infection risk. Custom compression garments should be replaced q 3-6 months When worn consistently for optimal lipo-lymphedema self-management over time.  ASSESSMENT: CLINICAL IMPRESSION: Session focused on MLE to RLE and RLQ in supine and side lying as established using ipsilateral IA anastomosis to mobilize lower quadrant swelling.  No volume change observed immediately  after manual therapy. Applied multilayer compression wraps to RLE as established. Response to CDT unclear to date. Cont as per POC.  OBJECTIVE IMPAIRMENTS: decreased knowledge of condition, decreased knowledge of use of DME, chronic progressive BLE swelling, R>L , LE associated pain and skin changes,   ACTIVITY LIMITATIONS: gravity dependent sitting, standing and extended walking exacerbate limb swelling  and associated pain limiting functional ambulation and mobility; limb swelling limits Pt's ability to fit street shoes and socks; BLE LE and pain limits social participation; LE limits body image, and  "gets me down" causing feelings of anger, depression or frustration.    PARTICIPATION LIMITATIONS: painting hobby, social participation in community activities requiring extended sitting, standing, walking  PERSONAL FACTORS: Time since onset of injury/illness/exacerbation and 1-2 comorbidities: (Ca hx w XRT, HTN)  are also affecting patient's functional outcome.   REHAB POTENTIAL: Good    GOALS: Goals reviewed with patient? Yes  SHORT TERM GOALS: Target date: 4th OT Rx visit  Pt will demonstrate understanding of lymphedema precautions and prevention strategies with modified independence using a printed reference to identify at least 5 precautions and discussing how s/he may implement them into daily life to reduce risk of progression with extra time. Baseline:Max A Goal status: INITIAL  2.  Pt will be able to apply multilayer, thigh length, gradient, compression wraps to one leg at a time with modified assistance (extra time) to decrease limb volume, to limit infection risk, and to limit lymphedema progression.  Baseline: Max A Goal status: INITIAL   LONG TERM GOALS: Target date: 06/20/23  Given this patient's Intake score of TBA/100% on the functional outcomes FOTO tool, patient will experience an increase in function of 3 points to improve basic and instrumental ADLs performance, including lymphedema self-care.  Baseline: TBA Goal status: INITIAL  2.  Given this patient's Intake score of 8.82 % on the Lymphedema Life Impact Scale (LLIS), patient will experience a reduction of at least 5 points in her perceived level of functional impairment resulting from lymphedema to improve functional performance and quality of life (QOL). Baseline: 8.82% Goal status: INITIAL  3.   During Intensive phase CDT Pt will achieve at least 85% compliance with all lymphedema self-care home program components, including daily skin care, compression wraps and /or garments, simple self MLD and lymphatic pumping therex to habituate LE self care protocol  into ADLs for optimal  LE self-management over time. Baseline: Max A Goal status: INITIAL  4.  Pt will achieve at least a 10% volume reduction in the LLE to return limb to typical size and shape, to limit infection risk and LE progression, to decrease pain, to improve function. Baseline: Dependent Goal status: INITIAL  5.  Pt will obtain proper, recommended compression garments/devices and be modified independent with donning/doffing to optimize limb volume reduction and limit LE  progression over time. Baseline:  Goal status: INITIAL  PLAN:  PT FREQUENCY: 2x/week and PRN  PT DURATION: 12 weeks and PRN  PLANNED INTERVENTIONS:  Complete Decongestive Therapy: Therapeutic lymphatic pumping exercises, Manual lymph drainage (MLD), Multilayer, gradient Compression bandaging, Manual therapy, skin care to limit infection risk, scar massage; During self management phase Pt will be fit with appropriate compression garments and devices,  Therapeutic activities Patient/Family education for LE Self Care home program  PLAN FOR NEXT SESSION:  LLE knee length multilayer compression wraps Continue LE self care home program edu- wrapping and simple self-mld MLD to RLE/RLQ  Loel Dubonnet, MS, OTR/L, CLT-LANA 04/16/23 3:29 PM

## 2023-04-18 ENCOUNTER — Encounter: Payer: Self-pay | Admitting: Occupational Therapy

## 2023-04-18 ENCOUNTER — Ambulatory Visit: Payer: Medicare PPO | Admitting: Occupational Therapy

## 2023-04-18 ENCOUNTER — Encounter: Payer: Medicare PPO | Admitting: Occupational Therapy

## 2023-04-18 DIAGNOSIS — I890 Lymphedema, not elsewhere classified: Secondary | ICD-10-CM

## 2023-04-18 NOTE — Therapy (Unsigned)
OUTPATIENT OCCUPATIONAL THERAPY TREATMENT NOTE  LOWER EXTREMITY LYMPHEDEMA  Patient Name: Barbara Burgess MRN: [id number] DOB:27-Jan-1946, 77 y.o., female Today's Date: 04/19/2023  END OF SESSION:   OT End of Session - 04/18/23 1505     Visit Number 7    Number of Visits 36    Date for OT Re-Evaluation 06/19/23    OT Start Time 0300    OT Stop Time 0410    OT Time Calculation (min) 70 min    Activity Tolerance Patient tolerated treatment well;No increased pain    Behavior During Therapy WFL for tasks assessed/performed               Past Medical History:  Diagnosis Date   Arthritis    knee and hip,  per [facility].    Bilateral bunions    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HCC)     per [facility] Patient [facility].    Colitis    H/O CT scan of chest 11/27/2018   By Roland Rack, per [facility] Patient Packet.    H/O mammogram 11/27/2018   By Pearlie Oyster at [facility], per [facility] New Patient Packet.    History of CT scan of abdomen 11/27/2018   By Roland Rack, per [facility] Packet.    History of upper extremity x-ray 11/27/2018   By Lonny Prude, of Shoulder, per [facility] Patient Packet.    History of x-ray of hip 11/27/2018   By Lonny Prude, of Hip, per [facility] Patient Packet.    Hyperlipemia     per [facility] Packet.    Hypertension     per [facility] Packet.    Impingement syndrome of shoulder     per [facility] Packet.    Incontinence    Mild Nighttime per [facility] Patient Packet.   Insomnia    Melanoma (HCC)    right lower leg; right ovary   Osteoarthritis of multiple joints    Rosacea     per [facility] Patient Packet.    Sleep disorder    Past Surgical History:  Procedure Laterality Date   ABDOMINAL HYSTERECTOMY  11/28/1987   Partial per [facility]   ARTHROSCOPIC REPAIR ACL Left 11/28/2003   Left Knee/ Torn Medial Meniscus per [facility] Patient Packet   BREAST BIOPSY     CATARACT EXTRACTION W/ INTRAOCULAR  LENS IMPLANT Bilateral    COLONOSCOPY  11/27/2016   By Liberty Handy,  per [facility] Packet.    COLONOSCOPY  11/27/2017   By Liberty Handy, per [facility] Patient Packet.    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   Procedure: TOTAL KNEE ARTHROPLASTY;  Surgeon: Juanell Fairly, MD;  Location: [facility] ORS;  Service: Orthopedics;  Laterality: Left;   TUBAL LIGATION  1976   Patient Active Problem List   Diagnosis Date Noted   Chronic venous insufficiency 03/25/2023   Lymphedema 12/24/2022   S/P TKR (total knee replacement) using cement, left 12/20/2021   Sesamoiditis 05/06/2020   Hav (hallux abducto valgus), unspecified laterality 01/08/2020   Porokeratosis 01/08/2020   Insomnia  Rosacea    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HCC)    History of melanoma    Hypertension    Hyperlipemia    Osteoarthritis of multiple joints    Arthritis of knee, left 07/14/2016   Foot pain 03/17/2013   Melanoma of skin (HCC) 09/13/2011    PCP: Stann Mainland. Sampson Goon, MD  REFERRING PROVIDER: Sheppard Plumber, NP  REFERRING DIAG: I89.0  THERAPY DIAG:  Lymphedema, not elsewhere classified  Rationale for Evaluation and Treatment: Rehabilitation  ONSET DATE: Onset of intermittent of RLE s/p XRT for R melanoma recurrence in 2012  SUBJECTIVE:                                                                                                                                                                                           SUBJECTIVE STATEMENT:Barbara Burgess presents to  OT for modified  Intensive Phase CDT to cancer-related  BLE lymphedema, R>L. Pt  reports she tolerated knee length, RLE, compression wraps during visit  interval without increased pain. Pt states she resumed swimming 5 x weekly swimming for exercise . She used light duty , knee length compression stocking after pool and before coming to clinical session to conserve physical effort of re bandaging again. Pt also reports she feels she has bandaging techniques in hand.  PERTINENT HISTORY:   relevant to LE: Takes NORVASC, HTN, L knee OA, hysterectomy, 1976 Tubal ligation, former smoker;  Oncology History 09/05/2011 Exploratory laparotomy and debulking of right pelvic sidewall mass.~ 2. Bilateral salpingo-oophorectomy.~ 3. Right pelvic lymph node sampling.~~~; Path: Right pelvic sidewall mass and right fallopian tube and ovary, excision; - Metastatic malignant melanoma involving matted lymph nodes with extracapsular; extension, size: 9.0 x 7.5 x 4.5 cm; ; Braf neg;  Stage III or stage IV melanoma (we cannot tell if this is a locally advanced stage III cancer or stage IV) of the right lower leg with metastasis to the right pelvic lymph nodes and ovaries.  Fall 2012: Radiation therapy L groin and LLQ  PAIN:  Are you having pain? Yes: NPRS scale: not rated/10 Pain location: BLE, R>L Pain description: heavy, tight, full, neuropathic pain on top of R foot Aggravating factors: dependent sitting, standing, walking, hot weather Relieving factors: compression, elevation  PRECAUTIONS: Other: Lymphedema precautions; Hx melanoma  HAND DOMINANCE: right   PRIOR LEVEL OF FUNCTION: Independent  PATIENT GOALS: Learn how to best manage my lymphedema to keep it from getting worse   OBJECTIVE:  OBSERVATIONS / OTHER ASSESSMENTS:  Moderate, Stage  II, Cancer-related, Left Lower Extremity/ Left Lower Quadrant Lymphedema   FOTO functional outcome measure: TBA at first OT Rx visit  Lymphedema  Life Impact Scale (LLIS): 8.82% (The extent to which LE-related problems affected your life in the last week)   BLE COMPARATIVE LIMB VOLUMETRICS: Initial  03/29/23  LANDMARK RIGHT    R LEG (A-D) 3644.0 ml  R THIGH (E-G) 6111.3 ml  R FULL LIMB (A-G) 9755.2 ml  Limb Volume differential (LVD)  %  Volume change since initial %  Volume change overall V  (Blank rows = not tested)  LANDMARK LEFT   L LEG (A-D) 4076.7 ml  L THIGH (E-G) 6365.5 ml  L FULL LIMB (A-G) 10443.15 ml  Limb Volume differential (LVD)  LEG LVD= 12.9%, L>R Thigh LVD =4.2 %. L>R Full LIMB LVD = 7.05%, L>R  Volume change since initial %  Volume change overall %  (Blank rows = not tested)     TODAY'S TREATMENT:      . PATIENT EDUCATION: Pt edu for LE self-care home program:  multilayer compression bandaging; simple self MLD Person educated: Patient Education method: Programmer, multimedia, Demonstration, Verbal and Tactile cues, and Handouts Education comprehension: verbalized understanding, returned demonstration, verbal cues required, and needs further education  LYMPHEDEMA SELF-CARE HOME PROGRAM: all to be performed daily and ongoing as LE is a chronic, progressive condition with no cure RLE/RLQ Simple Self-Manual Lymphatic Drainage (MLD): In supine and side lying utilizing bilateral short neck sequence, deep abdominal pathways via diaphragmatic breathing, ipsilateral axillary inguinal  anastomosis, then inguinal LN, and dynamic J strokes from proximal to distal staring at groin and thigh, then knee, then leg segment, and finally  retrograde sweeps back to terminus. Skin care to limit infection risk and facilitate essential skin excursion or optimal lymphatic function Lymphatic Pumping there ex- 2 x daily, in sequence, 10 reps bilaterally. Hole 5 seconds each Multilayer compression wrapping: 1 each 8 and 10 cm wide short stretch bandage applied  using overlapping, gradient techniques starting at base of the and ending at popliteal fossa. Bandages applied over a single layer of 0.4 cm thick Rosidal foam and cotton stockinett underneath all. During Intensive Phase CDT patients undergo  short stretch bandaging using gradient techniques one limb at a time, toes to groin During Self-management phase of CDT Pt's utilize appropriate compression garments and HOS devices that meet their individual needs, abilities and lifestyle.   Custom-made gradient compression garments and HOS devices are medically necessary in this case because they are uniquely sized and shaped to fit the exact dimensions of the affected extremities with deformities, and to provide accurate and consistent gradient compression and containment, essential to optimally managing this patient's symptoms of chronic, progressive lymphedema. Multiple custom compression garments are needed for optimal hygiene to limit infection risk. Custom compression garments should be replaced q 3-6 months When worn consistently for optimal lipo-lymphedema self-management over time.  ASSESSMENT: CLINICAL IMPRESSION: Session focused on MLD to RLE and RLQ in supine and side lying as established using ipsilateral IA anastomosis to mobilize lower quadrant swelling.  Obvious volume reduction observed in leg at end of session today. Applied multilayer compression wraps to RLE as established.. Cont as per POC.  OBJECTIVE IMPAIRMENTS: decreased knowledge of condition, decreased knowledge of use of DME, chronic progressive BLE swelling, R>L , LE associated pain and skin changes,   ACTIVITY LIMITATIONS: gravity dependent sitting, standing and extended walking exacerbate limb swelling and associated pain limiting functional ambulation and mobility; limb swelling limits Pt's ability to fit street shoes and socks; BLE LE and pain limits social participation; LE limits body image, and  "gets me down" causing feelings  of anger, depression or frustration.   PARTICIPATION LIMITATIONS: painting hobby, social participation in community activities requiring extended sitting, standing, walking  PERSONAL FACTORS: Time since onset of injury/illness/exacerbation and  1-2 comorbidities: (Ca hx w XRT, HTN)  are also affecting patient's functional outcome.   REHAB POTENTIAL: Good    GOALS: Goals reviewed with patient? Yes  SHORT TERM GOALS: Target date: 4th OT Rx visit  Pt will demonstrate understanding of lymphedema precautions and prevention strategies with modified independence using a printed reference to identify at least 5 precautions and discussing how s/he may implement them into daily life to reduce risk of progression with extra time. Baseline:Max A Goal status: INITIAL  2.  Pt will be able to apply multilayer, thigh length, gradient, compression wraps to one leg at a time with modified assistance (extra time) to decrease limb volume, to limit infection risk, and to limit lymphedema progression.  Baseline: Max A Goal status: INITIAL   LONG TERM GOALS: Target date: 06/20/23  Given this patient's Intake score of TBA/100% on the functional outcomes FOTO tool, patient will experience an increase in function of 3 points to improve basic and instrumental ADLs performance, including lymphedema self-care.  Baseline: TBA Goal status: INITIAL  2.  Given this patient's Intake score of 8.82 % on the Lymphedema Life Impact Scale (LLIS), patient will experience a reduction of at least 5 points in her perceived level of functional impairment resulting from lymphedema to improve functional performance and quality of life (QOL). Baseline: 8.82% Goal status: INITIAL  3.   During Intensive phase CDT Pt will achieve at least 85% compliance with all lymphedema self-care home program components, including daily skin care, compression wraps and /or garments, simple self MLD and lymphatic pumping therex to habituate LE self care protocol  into ADLs for optimal LE self-management over time. Baseline: Max A Goal status: INITIAL  4.  Pt will achieve at least a 10% volume reduction in the LLE to return limb to typical size and shape, to limit infection risk and LE  progression, to decrease pain, to improve function. Baseline: Dependent Goal status: INITIAL  5.  Pt will obtain proper, recommended compression garments/devices and be modified independent with donning/doffing to optimize limb volume reduction and limit LE  progression over time. Baseline:  Goal status: INITIAL  PLAN:  PT FREQUENCY: 2x/week and PRN  PT DURATION: 12 weeks and PRN  PLANNED INTERVENTIONS:  Complete Decongestive Therapy: Therapeutic lymphatic pumping exercises, Manual lymph drainage (MLD), Multilayer, gradient Compression bandaging, Manual therapy, skin care to limit infection risk, scar massage; During self management phase Pt will be fit with appropriate compression garments and devices,  Therapeutic activities Patient/Family education for LE Self Care home program  PLAN FOR NEXT SESSION:  LLE knee length multilayer compression wraps Continue LE self care home program edu- wrapping and simple self-mld MLD to RLE/RLQ  Loel Dubonnet, MS, OTR/L, CLT-LANA 04/19/23 7:59 AM

## 2023-04-26 ENCOUNTER — Ambulatory Visit: Payer: Medicare PPO | Admitting: Occupational Therapy

## 2023-04-26 DIAGNOSIS — I890 Lymphedema, not elsewhere classified: Secondary | ICD-10-CM | POA: Diagnosis not present

## 2023-04-26 NOTE — Therapy (Addendum)
OUTPATIENT OCCUPATIONAL THERAPY TREATMENT NOTE  LOWER EXTREMITY LYMPHEDEMA  Patient Name: Barbara Burgess MRN: [id number] DOB:September 20, 1946, 77 y.o., female Today's Date: 04/27/2023  END OF SESSION:   OT End of Session - 04/26/23 1613     Visit Number 8    Number of Visits 36    Date for OT Re-Evaluation 06/19/23    OT Start Time 0200    OT Stop Time 0300    OT Time Calculation (min) 60 min    Activity Tolerance Patient tolerated treatment well;No increased pain    Behavior During Therapy WFL for tasks assessed/performed               Past Medical History:  Diagnosis Date   Arthritis    knee and hip,  per [facility].    Bilateral bunions    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HCC)     per [facility] New Patient Packet.    Colitis    H/O CT scan of chest 11/27/2018   By Roland Rack, per [facility] New Patient Packet.    H/O mammogram 11/27/2018   By Pearlie Oyster at [facility], per [facility] New Patient Packet.    History of CT scan of abdomen 11/27/2018   By Roland Rack, per [facility] Patient Packet.    History of upper extremity x-ray 11/27/2018   By Lonny Prude, of Shoulder, per [facility] New Patient Packet.    History of x-ray of hip 11/27/2018   By Lonny Prude, of Hip, per [facility] New Patient Packet.    Hyperlipemia     per [facility] Patient Packet.    Hypertension     per [facility] New Patient Packet.    Impingement syndrome of shoulder     per [facility] New Patient Packet.    Incontinence    Mild Nighttime per [facility] New Patient Packet.   Insomnia    Melanoma (HCC)    right lower leg; right ovary   Osteoarthritis of multiple joints    Rosacea     per [facility] New Patient Packet.    Sleep disorder    Past Surgical History:  Procedure Laterality Date   ABDOMINAL HYSTERECTOMY  11/28/1987   Partial per [facility] Patient Packet   ARTHROSCOPIC REPAIR ACL Left 11/28/2003   Left Knee/ Torn Medial Meniscus per [facility] New Patient Packet   BREAST BIOPSY     CATARACT EXTRACTION W/ INTRAOCULAR  LENS IMPLANT Bilateral    COLONOSCOPY  11/27/2016   By Liberty Handy,  per [facility] New Patient Packet.    COLONOSCOPY  11/27/2017   By Liberty Handy, per [facility] New Patient Packet.    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   Procedure: TOTAL KNEE ARTHROPLASTY;  Surgeon: Juanell Fairly, MD;  Location: [facility] ORS;  Service: Orthopedics;  Laterality: Left;   TUBAL LIGATION  1976   Patient Active Problem List   Diagnosis Date Noted   Chronic venous insufficiency 03/25/2023   Lymphedema 12/24/2022   S/P TKR (total knee replacement) using cement, left 12/20/2021   Sesamoiditis 05/06/2020   Hav (hallux abducto valgus), unspecified laterality 01/08/2020   Porokeratosis 01/08/2020   Insomnia  Rosacea    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HCC)    History of melanoma    Hypertension    Hyperlipemia    Osteoarthritis of multiple joints    Arthritis of knee, left 07/14/2016   Foot pain 03/17/2013   Melanoma of skin (HCC) 09/13/2011    PCP: Stann Mainland. Sampson Goon, MD  REFERRING PROVIDER: Sheppard Plumber, NP  REFERRING DIAG: I89.0  THERAPY DIAG:  Lymphedema, not elsewhere classified [I89.0]  Rationale for Evaluation and Treatment: Rehabilitation  ONSET DATE: Onset of intermittent of RLE s/p XRT for R melanoma recurrence in 2012  SUBJECTIVE:                                                                                                                                                                                           SUBJECTIVE STATEMENT:Barbara Burgess presents to  OT for modified  Intensive Phase CDT to cancer-related  BLE lymphedema, R>L. Pt  reports she tolerated knee length, RLE, compression wraps during  visit interval without increased pain. Pt states she resumed swimming 5 x weekly swimming for exercise . She used light duty , knee length compression stocking after pool and before coming to clinical session to conserve physical effort of re bandaging again. Pt also reports she feels she has bandaging techniques in hand. She states, "This treatment is working. I can see a difference."  PERTINENT HISTORY:   relevant to LE: Takes NORVASC, HTN, L knee OA, hysterectomy, 1976 Tubal ligation, former smoker;  Oncology History 09/05/2011 Exploratory laparotomy and debulking of right pelvic sidewall mass.~ 2. Bilateral salpingo-oophorectomy.~ 3. Right pelvic lymph node sampling.~~~; Path: Right pelvic sidewall mass and right fallopian tube and ovary, excision; - Metastatic malignant melanoma involving matted lymph nodes with extracapsular; extension, size: 9.0 x 7.5 x 4.5 cm; ; Braf neg;  Stage III or stage IV melanoma (we cannot tell if this is a locally advanced stage III cancer or stage IV) of the right lower leg with metastasis to the right pelvic lymph nodes and ovaries.  Fall 2012: Radiation therapy L groin and LLQ  PAIN:  Are you having pain? Yes: NPRS scale: not rated/10 Pain location: BLE, R>L Pain description: heavy, tight, full, neuropathic pain on top of R foot Aggravating factors: dependent sitting, standing, walking, hot weather Relieving factors: compression, elevation  PRECAUTIONS: Other: Lymphedema precautions; Hx melanoma  HAND DOMINANCE: right   PRIOR LEVEL OF FUNCTION: Independent  PATIENT GOALS: Learn how to best manage my lymphedema to keep it from getting worse   OBJECTIVE:  OBSERVATIONS / OTHER ASSESSMENTS:  Moderate, Stage  II, Cancer-related, Left Lower Extremity/ Left Lower Quadrant Lymphedema  FOTO functional outcome measure: TBA at first OT Rx visit  Lymphedema Life Impact Scale (LLIS): 8.82% (The extent to which LE-related problems affected your life in the  last week)   BLE COMPARATIVE LIMB VOLUMETRICS: Initial 03/29/23  LANDMARK RIGHT    R LEG (A-D) 3644.0 ml  R THIGH (E-G) 6111.3 ml  R FULL LIMB (A-G) 9755.2 ml  Limb Volume differential (LVD)  %  Volume change since initial %  Volume change overall V  (Blank rows = not tested)  LANDMARK LEFT   L LEG (A-D) 4076.7 ml  L THIGH (E-G) 6365.5 ml  L FULL LIMB (A-G) 10443.15 ml  Limb Volume differential (LVD)  LEG LVD= 12.9%, L>R Thigh LVD =4.2 %. L>R Full LIMB LVD = 7.05%, L>R  Volume change since initial %  Volume change overall %  (Blank rows = not tested)     TODAY'S TREATMENT:      . PATIENT EDUCATION: Continued Pt/ CG edu for lymphedema self care home program throughout session. Topics include outcome of comparative limb volumetrics- starting limb volume differentials (LVDs), technology and gradient techniques used for short stretch, multilayer compression wrapping, simple self-MLD, therapeutic lymphatic pumping exercises, skin/nail care, LE precautions,. compression garment recommendations and specifications, wear and care schedule and compression garment donning / doffing w assistive devices. Discussed progress towards all OT goals since commencing CDT. All questions answered to the Pt's satisfaction. Good return.  Pt education re advanced vaso-pneumatic "pump" vs basic sequential pneumatic compression device (Flexitouch Plus), including clinical indications, how the basic pneumatic "pump" differs from the advanced Flexitouch device, indications, contraindications and precautions and care and use routines for pneumatic compression devices. Patient's questions were answered throughout trial and handouts and Internet resources given for reference.  Person educated: Patient Education method: Explanation, Demonstration, Verbal and Tactile cues, and Handouts Education comprehension: verbalized understanding, returned demonstration, verbal cues required, and needs further  education  LYMPHEDEMA SELF-CARE HOME PROGRAM: all to be performed daily and ongoing as LE is a chronic, progressive condition with no cure RLE/RLQ Simple Self-Manual Lymphatic Drainage (MLD): In supine and side lying utilizing bilateral short neck sequence, deep abdominal pathways via diaphragmatic breathing, ipsilateral axillary inguinal  anastomosis, then inguinal LN, and dynamic J strokes from proximal to distal staring at groin and thigh, then knee, then leg segment, and finally  retrograde sweeps back to terminus. Skin care to limit infection risk and facilitate essential skin excursion or optimal lymphatic function Lymphatic Pumping there ex- 2 x daily, in sequence, 10 reps bilaterally. Hole 5 seconds each Multilayer compression wrapping: 1 each 8 and 10 cm wide short stretch bandage applied  using overlapping, gradient techniques starting at base of the and ending at popliteal fossa. Bandages applied over a single layer of 0.4 cm thick Rosidal foam and cotton stockinett underneath all. During Intensive Phase CDT patients undergo short stretch bandaging using gradient techniques one limb at a time, toes to groin During Self-management phase of CDT Pt's utilize appropriate compression garments and HOS devices that meet their individual needs, abilities and lifestyle.   Custom-made gradient compression garments and HOS devices are medically necessary in this case because they are uniquely sized and shaped to fit the exact dimensions of the affected extremities with deformities, and to provide accurate and consistent gradient compression and containment, essential to optimally managing this patient's symptoms of chronic, progressive lymphedema. Multiple custom compression garments are needed for optimal hygiene to limit infection risk. Custom compression garments should be replaced q 3-6 months When  worn consistently for optimal lipo-lymphedema self-management over time.  ASSESSMENT: CLINICAL  IMPRESSION: Session focused on MLD to RLE and RLQ in supine and side lying as established using ipsilateral IA anastomosis to mobilize lower quadrant swelling.  Obvious volume reduction observed in leg at end of session today. Applied multilayer compression wraps to RLE as established.. Cont as per POC.  Medical necessity for Advanced , Sequential Pneumatic Flexitouch Device: Continued Pt edu re pneumatic compression devices throughout session. Ms Goepfert will benefit from a Flexitouch Plus advanced device to assist her with long term self-management of stage II, RLE/RLQ lymphedema, including abdomen, 2/2 treatment for endometrial cancer. Ms. Osteen has undergone skilled Occupational Therapy for conservative Complete Decongestive Therapy (CDT) 2 x weekly for greater than 4 weeks with significant signs/symptoms of chronic lymphedema persisting. CDT includes multi-layer compression with short stretch bandages during the Intensive Phase, appropriate compression garments once limb volume reduction plateaus in the self-management phase, therapeutic lymphatic pumping exercise, skin care daily to limit infection risk and progression, and daily manual lymphatic drainage (MLD) and limb elevation. Pt is 100% compliant with all self-management home program components. She used a basic ([id number]) vaso pneumatic compression "pump" (model and serial number unknown)   for several weeks. This basic device provides squeeze and release compression  from the ankle to the proximal thigh only, and does not address this patient's groin and abdominal involvement  2/2 cancer Rx. The basic device failed to control chronic, progressive lymphedema in the right lower extremity and lower quadrant (abdomen and groin). Chronic swelling is unchanged, lymphedema-associated pain and discomfort persists, and high-protein tissue fibrosis is unchanged . Pt will benefit from the Flexitouch PLUS advanced [phone]) sequential pneumatic compression device to  reduce limb volume and fibrosis, to reduce infection risk, and to optimally manage chronic, progressive lymphedema over time at home.   OBJECTIVE IMPAIRMENTS: decreased knowledge of condition, decreased knowledge of use of DME, chronic progressive BLE swelling, R>L , LE associated pain and skin changes,   ACTIVITY LIMITATIONS: gravity dependent sitting, standing and extended walking exacerbate limb swelling and associated pain limiting functional ambulation and mobility; limb swelling limits Pt's ability to fit street shoes and socks; BLE LE and pain limits social participation; LE limits body image, and  "gets me down" causing feelings of anger, depression or frustration.   PARTICIPATION LIMITATIONS: painting hobby, social participation in community activities requiring extended sitting, standing, walking  PERSONAL FACTORS: Time since onset of injury/illness/exacerbation and 1-2 comorbidities: (Ca hx w XRT, HTN)  are also affecting patient's functional outcome.   REHAB POTENTIAL: Good    GOALS: Goals reviewed with patient? Yes  SHORT TERM GOALS: Target date: 4th OT Rx visit  Pt will demonstrate understanding of lymphedema precautions and prevention strategies with modified independence using a printed reference to identify at least 5 precautions and discussing how s/he may implement them into daily life to reduce risk of progression with extra time. Baseline:Max A Goal status: INITIAL  2.  Pt will be able to apply multilayer, thigh length, gradient, compression wraps to one leg at a time with modified assistance (extra time) to decrease limb volume, to limit infection risk, and to limit lymphedema progression.  Baseline: Max A Goal status: INITIAL   LONG TERM GOALS: Target date: 06/20/23  Given this patient's Intake score of TBA/100% on the functional outcomes FOTO tool, patient will experience an increase in function of 3 points to improve basic and instrumental ADLs performance,  including lymphedema self-care.  Baseline: TBA Goal status:  INITIAL  2.  Given this patient's Intake score of 8.82 % on the Lymphedema Life Impact Scale (LLIS), patient will experience a reduction of at least 5 points in her perceived level of functional impairment resulting from lymphedema to improve functional performance and quality of life (QOL). Baseline: 8.82% Goal status: INITIAL  3.   During Intensive phase CDT Pt will achieve at least 85% compliance with all lymphedema self-care home program components, including daily skin care, compression wraps and /or garments, simple self MLD and lymphatic pumping therex to habituate LE self care protocol  into ADLs for optimal LE self-management over time. Baseline: Max A Goal status: INITIAL  4.  Pt will achieve at least a 10% volume reduction in the LLE to return limb to typical size and shape, to limit infection risk and LE progression, to decrease pain, to improve function. Baseline: Dependent Goal status: INITIAL  5.  Pt will obtain proper, recommended compression garments/devices and be modified independent with donning/doffing to optimize limb volume reduction and limit LE  progression over time. Baseline:  Goal status: INITIAL  PLAN:  PT FREQUENCY: 2x/week and PRN  PT DURATION: 12 weeks and PRN  PLANNED INTERVENTIONS:  Complete Decongestive Therapy: Therapeutic lymphatic pumping exercises, Manual lymph drainage (MLD), Multilayer, gradient Compression bandaging, Manual therapy, skin care to limit infection risk, scar massage; During self management phase Pt will be fit with appropriate compression garments and devices,  Therapeutic activities Patient/Family education for LE Self Care home program  PLAN FOR NEXT SESSION:  LLE knee length multilayer compression wraps Continue LE self care home program edu- wrapping and simple self-mld MLD to RLE/RLQ  Loel Dubonnet, MS, OTR/L, CLT-LANA 04/27/23 11:52 AM

## 2023-04-30 ENCOUNTER — Ambulatory Visit: Payer: Medicare PPO | Attending: Nurse Practitioner | Admitting: Occupational Therapy

## 2023-04-30 ENCOUNTER — Encounter: Payer: Self-pay | Admitting: Occupational Therapy

## 2023-04-30 DIAGNOSIS — I890 Lymphedema, not elsewhere classified: Secondary | ICD-10-CM | POA: Insufficient documentation

## 2023-04-30 NOTE — Therapy (Signed)
OUTPATIENT OCCUPATIONAL THERAPY TREATMENT NOTE  LOWER EXTREMITY LYMPHEDEMA  Patient Name: Barbara Burgess MRN: [id number] DOB:May 27, 1946, 77 y.o., female Today's Date: 04/30/2023  END OF SESSION:   OT End of Session - 04/30/23 1013     Visit Number 9    Number of Visits 36    Date for OT Re-Evaluation 06/19/23    OT Start Time 1005    OT Stop Time 1107    OT Time Calculation (min) 62 min    Activity Tolerance Patient tolerated treatment well;No increased pain    Behavior During Therapy WFL for tasks assessed/performed               Past Medical History:  Diagnosis Date   Arthritis    knee and hip,  per [facility].    Bilateral bunions    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HCC)     per [facility] Patient Packet.    Colitis    H/O CT scan of chest 11/27/2018   By Roland Rack, per [facility] Patient Packet.    H/O mammogram 11/27/2018   By Pearlie Oyster at [facility], per [facility] New Patient Packet.    History of CT scan of abdomen 11/27/2018   By Roland Rack, per [facility] Patient Packet.    History of upper extremity x-ray 11/27/2018   By Lonny Prude, of Shoulder, per [facility] Patient Packet.    History of x-ray of hip 11/27/2018   By Lonny Prude, of Hip, per [facility] Patient Packet.    Hyperlipemia     per [facility] Packet.    Hypertension     per [facility] Patient Packet.    Impingement syndrome of shoulder     per [facility] Patient Packet.    Incontinence    Mild Nighttime per [facility] Patient Packet.   Insomnia    Melanoma (HCC)    right lower leg; right ovary   Osteoarthritis of multiple joints    Rosacea     per [facility] Patient Packet.    Sleep disorder    Past Surgical History:  Procedure Laterality Date   ABDOMINAL HYSTERECTOMY  11/28/1987   Partial per [facility] Packet   ARTHROSCOPIC REPAIR ACL Left 11/28/2003   Left Knee/ Torn Medial Meniscus per [facility] Patient Packet   BREAST BIOPSY     CATARACT EXTRACTION W/ INTRAOCULAR  LENS IMPLANT Bilateral    COLONOSCOPY  11/27/2016   By Liberty Handy,  per [facility] Patient Packet.    COLONOSCOPY  11/27/2017   By Liberty Handy, per [facility] Patient Packet.    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   Procedure: TOTAL KNEE ARTHROPLASTY;  Surgeon: Juanell Fairly, MD;  Location: [facility] ORS;  Service: Orthopedics;  Laterality: Left;   TUBAL LIGATION  1976   Patient Active Problem List   Diagnosis Date Noted   Chronic venous insufficiency 03/25/2023   Lymphedema 12/24/2022   S/P TKR (total knee replacement) using cement, left 12/20/2021   Sesamoiditis 05/06/2020   Hav (hallux abducto valgus), unspecified laterality 01/08/2020   Porokeratosis 01/08/2020   Insomnia  Rosacea    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HCC)    History of melanoma    Hypertension    Hyperlipemia    Osteoarthritis of multiple joints    Arthritis of knee, left 07/14/2016   Foot pain 03/17/2013   Melanoma of skin (HCC) 09/13/2011    PCP: Stann Mainland. Sampson Goon, MD  REFERRING PROVIDER: Sheppard Plumber, NP  REFERRING DIAG: I89.0  THERAPY DIAG:  Lymphedema, not elsewhere classified [I89.0]  Rationale for Evaluation and Treatment: Rehabilitation  ONSET DATE: Onset of RLE lymphedema s/p XRT for R melanoma recurrence in 2012  SUBJECTIVE:                                                                                                                                                                                           SUBJECTIVE STATEMENT:Barbara Burgess presents to  OT for modified  Intensive Phase CDT to cancer-related  BLE lymphedema, R>L. Pt  tolerated knee length, RLE, compression wraps during visit interval  without increased pain. Pt reports intermittent "stinging" pain on the top of both feet, which is thought to be neuropathy. Pt states she believes compression makes this worse as it gets more intense as the day goes on.   PERTINENT HISTORY:   relevant to LE: Takes NORVASC, HTN, L knee OA, hysterectomy, 1976 Tubal ligation, former smoker;  Oncology History 09/05/2011 Exploratory laparotomy and debulking of right pelvic sidewall mass.~ 2. Bilateral salpingo-oophorectomy.~ 3. Right pelvic lymph node sampling.~~~; Path: Right pelvic sidewall mass and right fallopian tube and ovary, excision; - Metastatic malignant melanoma involving matted lymph nodes with extracapsular; extension, size: 9.0 x 7.5 x 4.5 cm; ; Braf neg;  Stage III or stage IV melanoma (we cannot tell if this is a locally advanced stage III cancer or stage IV) of the right lower leg with metastasis to the right pelvic lymph nodes and ovaries.  Fall 2012: Radiation therapy L groin and LLQ  PAIN:  Are you having pain? Yes: NPRS scale: not rated/10 Pain location: BLE, R>L Pain description: heavy, tight, full, neuropathic pain on top of R foot Aggravating factors: dependent sitting, standing, walking, hot weather Relieving factors: compression, elevation  PRECAUTIONS: Other: Lymphedema precautions; Hx melanoma  HAND DOMINANCE: right   PRIOR LEVEL OF FUNCTION: Independent  PATIENT GOALS: Learn how to best manage my lymphedema to keep it from getting worse   OBJECTIVE:  OBSERVATIONS / OTHER ASSESSMENTS:  Moderate, Stage  II, Cancer-related, Left Lower Extremity/ Left Lower Quadrant Lymphedema   FOTO functional outcome measure: TBA at first OT Rx visit  Lymphedema Life Impact Scale (LLIS): 8.82% (The extent to which LE-related problems affected your life  in the last week)   BLE COMPARATIVE LIMB VOLUMETRICS: Initial 03/29/23  LANDMARK RIGHT    R LEG (A-D) 3644.0 ml  R THIGH (E-G) 6111.3 ml  R FULL LIMB (A-G) 9755.2 ml   Limb Volume differential (LVD)  %  Volume change since initial %  Volume change overall V  (Blank rows = not tested)  LANDMARK LEFT   L LEG (A-D) 4076.7 ml  L THIGH (E-G) 6365.5 ml  L FULL LIMB (A-G) 10443.15 ml  Limb Volume differential (LVD)  LEG LVD= 12.9%, L>R Thigh LVD =4.2 %. L>R Full LIMB LVD = 7.05%, L>R  Volume change since initial %  Volume change overall %  (Blank rows = not tested)     TODAY'S TREATMENT:   RLE/RLQ MLD utilizing established techniques RLE knee length, multilayer compression wrapping as established  PATIENT EDUCATION: Continued Pt/ CG edu for lymphedema self care home program throughout session. Topics include outcome of comparative limb volumetrics- starting limb volume differentials (LVDs), technology and gradient techniques used for short stretch, multilayer compression wrapping, simple self-MLD, therapeutic lymphatic pumping exercises, skin/nail care, LE precautions,. compression garment recommendations and specifications, wear and care schedule and compression garment donning / doffing w assistive devices. Discussed progress towards all OT goals since commencing CDT. All questions answered to the Pt's satisfaction. Good return. Pt education re advanced vaso-pneumatic "pump" vs basic sequential pneumatic compression device (Flexitouch Plus), including clinical indications, how the basic pneumatic "pump" differs from the advanced Flexitouch device, indications, contraindications and precautions and care and use routines for pneumatic compression devices. Patient's questions were answered throughout trial and handouts and Internet resources given for reference. Person educated: Patient Education method: Explanation, Demonstration, Verbal and Tactile cues, and Handouts Education comprehension: verbalized understanding, returned demonstration, verbal cues required, and needs further education  LYMPHEDEMA SELF-CARE HOME PROGRAM: all to be performed daily and  ongoing as LE is a chronic, progressive condition with no cure RLE/RLQ Simple Self-Manual Lymphatic Drainage (MLD): In supine and side lying utilizing bilateral short neck sequence, deep abdominal pathways via diaphragmatic breathing, ipsilateral axillary inguinal  anastomosis, then inguinal LN, and dynamic J strokes from proximal to distal staring at groin and thigh, then knee, then leg segment, and finally  retrograde sweeps back to terminus. Skin care to limit infection risk and facilitate essential skin excursion or optimal lymphatic function Lymphatic Pumping there ex- 2 x daily, in sequence, 10 reps bilaterally. Hole 5 seconds each Multilayer compression wrapping: 1 each 8 and 10 cm wide short stretch bandage applied  using overlapping, gradient techniques starting at base of the and ending at popliteal fossa. Bandages applied over a single layer of 0.4 cm thick Rosidal foam and cotton stockinett underneath all. During Intensive Phase CDT patients undergo short stretch bandaging using gradient techniques one limb at a time, toes to groin During Self-management phase of CDT Pt's utilize appropriate compression garments and HOS devices that meet their individual needs, abilities and lifestyle.   Custom-made gradient compression garments and HOS devices are medically necessary in this case because they are uniquely sized and shaped to fit the exact dimensions of the affected extremities with deformities, and to provide accurate and consistent gradient compression and containment, essential to optimally managing this patient's symptoms of chronic, progressive lymphedema. Multiple custom compression garments are needed for optimal hygiene to limit infection risk. Custom compression garments should be replaced q 3-6 months When worn consistently for optimal lipo-lymphedema self-management over time.  ASSESSMENT: CLINICAL IMPRESSION: Session focused on MLD to RLE  and RLQ in supine and side lying as  established using ipsilateral IA anastomosis to mobilize lower quadrant lymphatic congestion proximally out of quadrant towards deep abdominal and axillary LN. Applied multilayer compression wraps to RLE as established. Pt denied increased pain on top of R foot at end of session. Cont as per POC.  Medical necessity for Advanced , Sequential Pneumatic Flexitouch Device: Continued Pt edu re pneumatic compression devices throughout session. Barbara Burgess will benefit from a Flexitouch Plus advanced device to assist her with long term self-management of stage II, RLE/RLQ lymphedema, including abdomen, 2/2 treatment for endometrial cancer. Barbara Burgess has undergone skilled Occupational Therapy for conservative Complete Decongestive Therapy (CDT) 2 x weekly for greater than 4 weeks with significant signs/symptoms of chronic lymphedema persisting. CDT includes multi-layer compression with short stretch bandages during the Intensive Phase, appropriate compression garments once limb volume reduction plateaus in the self-management phase, therapeutic lymphatic pumping exercise, skin care daily to limit infection risk and progression, and daily manual lymphatic drainage (MLD) and limb elevation. Pt is 100% compliant with all self-management home program components. She used a basic ([id number]) vaso pneumatic compression "pump" (model and serial number unknown)   for several weeks. This basic device provides squeeze and release compression  from the ankle to the proximal thigh only, and does not address this patient's groin and abdominal involvement  2/2 cancer Rx. The basic device failed to control chronic, progressive lymphedema in the right lower extremity and lower quadrant (abdomen and groin). Chronic swelling is unchanged, lymphedema-associated pain and discomfort persists, and high-protein tissue fibrosis is unchanged . Pt will benefit from the Flexitouch PLUS advanced [id number]) sequential pneumatic compression device to reduce  limb volume and fibrosis, to reduce infection risk, and to optimally manage chronic, progressive lymphedema over time at home.   OBJECTIVE IMPAIRMENTS: decreased knowledge of condition, decreased knowledge of use of DME, chronic progressive BLE swelling, R>L , LE associated pain and skin changes,   ACTIVITY LIMITATIONS: gravity dependent sitting, standing and extended walking exacerbate limb swelling and associated pain limiting functional ambulation and mobility; limb swelling limits Pt's ability to fit street shoes and socks; BLE LE and pain limits social participation; LE limits body image, and  "gets me down" causing feelings of anger, depression or frustration.   PARTICIPATION LIMITATIONS: painting hobby, social participation in community activities requiring extended sitting, standing, walking  PERSONAL FACTORS: Time since onset of injury/illness/exacerbation and 1-2 comorbidities: (Ca hx w XRT, HTN)  are also affecting patient's functional outcome.   REHAB POTENTIAL: Good    GOALS: Goals reviewed with patient? Yes  SHORT TERM GOALS: Target date: 4th OT Rx visit  Pt will demonstrate understanding of lymphedema precautions and prevention strategies with modified independence using a printed reference to identify at least 5 precautions and discussing how s/he may implement them into daily life to reduce risk of progression with extra time. Baseline:Max A Goal status: INITIAL  2.  Pt will be able to apply multilayer, thigh length, gradient, compression wraps to one leg at a time with modified assistance (extra time) to decrease limb volume, to limit infection risk, and to limit lymphedema progression.  Baseline: Max A Goal status: INITIAL   LONG TERM GOALS: Target date: 06/20/23  Given this patient's Intake score of TBA/100% on the functional outcomes FOTO tool, patient will experience an increase in function of 3 points to improve basic and instrumental ADLs performance, including  lymphedema self-care.  Baseline: TBA Goal status: INITIAL  2.  Given this  patient's Intake score of 8.82 % on the Lymphedema Life Impact Scale (LLIS), patient will experience a reduction of at least 5 points in her perceived level of functional impairment resulting from lymphedema to improve functional performance and quality of life (QOL). Baseline: 8.82% Goal status: INITIAL  3.   During Intensive phase CDT Pt will achieve at least 85% compliance with all lymphedema self-care home program components, including daily skin care, compression wraps and /or garments, simple self MLD and lymphatic pumping therex to habituate LE self care protocol  into ADLs for optimal LE self-management over time. Baseline: Max A Goal status: INITIAL  4.  Pt will achieve at least a 10% volume reduction in the LLE to return limb to typical size and shape, to limit infection risk and LE progression, to decrease pain, to improve function. Baseline: Dependent Goal status: INITIAL  5.  Pt will obtain proper, recommended compression garments/devices and be modified independent with donning/doffing to optimize limb volume reduction and limit LE  progression over time. Baseline:  Goal status: INITIAL  PLAN:  PT FREQUENCY: 2x/week and PRN  PT DURATION: 12 weeks and PRN  PLANNED INTERVENTIONS:  Complete Decongestive Therapy: Therapeutic lymphatic pumping exercises, Manual lymph drainage (MLD), Multilayer, gradient Compression bandaging, Manual therapy, skin care to limit infection risk, scar massage; During self management phase Pt will be fit with appropriate compression garments and devices,  Therapeutic activities Patient/Family education for LE Self Care home program  PLAN FOR NEXT SESSION:  LLE knee length multilayer compression wraps Continue LE self care home program edu- wrapping and simple self-mld MLD to RLE/RLQ  Loel Dubonnet, Barbara, OTR/L, CLT-LANA 04/30/23 1:05 PM

## 2023-05-02 ENCOUNTER — Ambulatory Visit: Payer: Medicare PPO | Admitting: Occupational Therapy

## 2023-05-07 ENCOUNTER — Ambulatory Visit: Payer: Medicare PPO | Admitting: Occupational Therapy

## 2023-05-07 ENCOUNTER — Encounter: Payer: Self-pay | Admitting: Occupational Therapy

## 2023-05-07 DIAGNOSIS — I890 Lymphedema, not elsewhere classified: Secondary | ICD-10-CM

## 2023-05-07 NOTE — Therapy (Signed)
OUTPATIENT OCCUPATIONAL THERAPY TREATMENT NOTE AND PROGRESS REPORT  LOWER EXTREMITY LYMPHEDEMA  Patient Name: Barbara Burgess MRN: [id number] DOB:January 22, 1946, 77 y.o., female Today's Date: 05/07/2023  REPORTING PERIOD: 03/21/23 - 05/07/23  END OF SESSION:   OT End of Session - 05/07/23 1010     Visit Number 10    Number of Visits 36    Date for OT Re-Evaluation 06/19/23    OT Start Time 1005    OT Stop Time 1105    OT Time Calculation (min) 60 min    Activity Tolerance Patient tolerated treatment well;No increased pain    Behavior During Therapy WFL for tasks assessed/performed               Past Medical History:  Diagnosis Date   Arthritis    knee and hip,  per [facility].    Bilateral bunions    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HCC)     per [facility] Patient Packet.    Colitis    H/O CT scan of chest 11/27/2018   By Roland Rack, per [facility] Patient Packet.    H/O mammogram 11/27/2018   By Pearlie Oyster at [facility], per [facility] New Patient Packet.    History of CT scan of abdomen 11/27/2018   By Roland Rack, per [facility] Patient Packet.    History of upper extremity x-ray 11/27/2018   By Lonny Prude, of Shoulder, per [facility] New Patient Packet.    History of x-ray of hip 11/27/2018   By Lonny Prude, of Hip, per [facility] New Patient Packet.    Hyperlipemia     per [facility] Patient Packet.    Hypertension     per [facility] Patient Packet.    Impingement syndrome of shoulder     per [facility] Patient Packet.    Incontinence    Mild Nighttime per [facility] New Patient Packet.   Insomnia    Melanoma (HCC)    right lower leg; right ovary   Osteoarthritis of multiple joints    Rosacea     per [facility] Patient Packet.    Sleep disorder    Past Surgical History:  Procedure Laterality Date   ABDOMINAL HYSTERECTOMY  11/28/1987   Partial per [facility] Patient Packet   ARTHROSCOPIC REPAIR ACL Left 11/28/2003   Left Knee/ Torn Medial Meniscus per [facility] New Patient  Packet   BREAST BIOPSY     CATARACT EXTRACTION W/ INTRAOCULAR LENS IMPLANT Bilateral    COLONOSCOPY  11/27/2016   By Liberty Handy,  per [facility] Patient Packet.    COLONOSCOPY  11/27/2017   By Liberty Handy, per [facility] New Patient Packet.    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   Procedure: TOTAL KNEE ARTHROPLASTY;  Surgeon: Juanell Fairly, MD;  Location: [facility] ORS;  Service: Orthopedics;  Laterality: Left;   TUBAL LIGATION  1976   Patient Active Problem List   Diagnosis Date Noted   Chronic venous insufficiency 03/25/2023   Lymphedema 12/24/2022   S/P TKR (total knee replacement) using cement, left 12/20/2021   Sesamoiditis 05/06/2020   Hav (hallux abducto valgus), unspecified laterality 01/08/2020  Porokeratosis 01/08/2020   Insomnia    Rosacea    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HCC)    History of melanoma    Hypertension    Hyperlipemia    Osteoarthritis of multiple joints    Arthritis of knee, left 07/14/2016   Foot pain 03/17/2013   Melanoma of skin (HCC) 09/13/2011    PCP: Stann Mainland. Sampson Goon, MD  REFERRING PROVIDER: Sheppard Plumber, NP  REFERRING DIAG: I89.0  THERAPY DIAG:  Lymphedema, not elsewhere classified [I89.0]  Rationale for Evaluation and Treatment: Rehabilitation  ONSET DATE: Onset of RLE lymphedema s/p XRT for R melanoma recurrence in 2012  SUBJECTIVE:                                                                                                                                                                                           SUBJECTIVE STATEMENT:Barbara Burgess presents to  OT for modified  Intensive Phase CDT to cancer-related  BLE lymphedema, R>L. Pt  tolerated  knee length, RLE, compression wraps during visit interval without increased pain. Pt reports intermittent "stinging" pain on the top of both feet, which is thought to be neuropathy. Pt states she believes compression makes this worse as it gets more intense as the day goes on.   PERTINENT HISTORY:   relevant to LE: Takes NORVASC, HTN, L knee OA, hysterectomy, 1976 Tubal ligation, former smoker;  Oncology History 09/05/2011 Exploratory laparotomy and debulking of right pelvic sidewall mass.~ 2. Bilateral salpingo-oophorectomy.~ 3. Right pelvic lymph node sampling.~~~; Path: Right pelvic sidewall mass and right fallopian tube and ovary, excision; - Metastatic malignant melanoma involving matted lymph nodes with extracapsular; extension, size: 9.0 x 7.5 x 4.5 cm; ; Braf neg;  Stage III or stage IV melanoma (we cannot tell if this is a locally advanced stage III cancer or stage IV) of the right lower leg with metastasis to the right pelvic lymph nodes and ovaries.  Fall 2012: Radiation therapy L groin and LLQ  PAIN:  Are you having pain? Yes: NPRS scale: not rated/10 Pain location: BLE, R>L Pain description: heavy, tight, full, neuropathic pain on top of R foot Aggravating factors: dependent sitting, standing, walking, hot weather Relieving factors: compression, elevation  PRECAUTIONS: Other: Lymphedema precautions; Hx melanoma  HAND DOMINANCE: right   PRIOR LEVEL OF FUNCTION: Independent  PATIENT GOALS: Learn how to best manage my lymphedema to keep it from getting worse   OBJECTIVE:  OBSERVATIONS / OTHER ASSESSMENTS:  Moderate, Stage  II, Cancer-related, Left Lower Extremity/ Left Lower Quadrant Lymphedema   Lymphedema Life Impact Scale (LLIS): 8.82% (The extent to which LE-related problems affected your life in the last  week)   RLE COMPARATIVE LIMB VOLUMETRICS: 10th Visit 05/07/23  LANDMARK RIGHT    R LEG (A-D) 3625.8  ml  R THIGH (E-G) 5896.1 ml  R FULL LIMB (A-G) 9522.0 ml   Limb Volume differential (LVD)  %  Volume change since last measured on 03/29/23 R leg (A-D) DECREASED by 0.5%. R thigh (E-G) DECREASED by 3.5%. RLE full limb volume is DECREASED 2.4%.   Volume change since initial   (Blank rows = not tested)  BLE COMPARATIVE LIMB VOLUMETRICS: Initial 03/29/23  LANDMARK RIGHT    R LEG (A-D) 3644.0 ml  R THIGH (E-G) 6111.3 ml  R FULL LIMB (A-G) 9755.2 ml  Limb Volume differential (LVD)  %  Volume change since initial %  Volume change overall V  (Blank rows = not tested)  LANDMARK LEFT   L LEG (A-D) 4076.7 ml  L THIGH (E-G) 6365.5 ml  L FULL LIMB (A-G) 10443.15 ml  Limb Volume differential (LVD)  LEG LVD= 12.9%, L>R Thigh LVD =4.2 %. L>R Full LIMB LVD = 7.05%, L>R  Volume change since initial %  Volume change overall %  (Blank rows = not tested)     TODAY'S TREATMENT:   RLE/RLQ comparative limb volumetrics and progress review RLE/RLQ MLD utilizing established techniques RLE knee length, multilayer compression wrapping as established  PATIENT EDUCATION: Continued Pt/ CG edu for lymphedema self care home program throughout session. Topics include outcome of comparative limb volumetrics- starting limb volume differentials (LVDs), technology and gradient techniques used for short stretch, multilayer compression wrapping, simple self-MLD, therapeutic lymphatic pumping exercises, skin/nail care, LE precautions,. compression garment recommendations and specifications, wear and care schedule and compression garment donning / doffing w assistive devices. Discussed progress towards all OT goals since commencing CDT. All questions answered to the Pt's satisfaction. Good return. Pt education re advanced vaso-pneumatic "pump" vs basic sequential pneumatic compression device (Flexitouch Plus), including clinical indications, how the basic pneumatic "pump" differs from the advanced Flexitouch device, indications, contraindications and precautions and care and use  routines for pneumatic compression devices. Patient's questions were answered throughout trial and handouts and Internet resources given for reference. Person educated: Patient Education method: Explanation, Demonstration, Verbal and Tactile cues, and Handouts Education comprehension: verbalized understanding, returned demonstration, verbal cues required, and needs further education  LYMPHEDEMA SELF-CARE HOME PROGRAM: all to be performed daily and ongoing as LE is a chronic, progressive condition with no cure RLE/RLQ Simple Self-Manual Lymphatic Drainage (MLD): In supine and side lying utilizing bilateral short neck sequence, deep abdominal pathways via diaphragmatic breathing, ipsilateral axillary inguinal  anastomosis, then inguinal LN, and dynamic J strokes from proximal to distal staring at groin and thigh, then knee, then leg segment, and finally  retrograde sweeps back to terminus. Skin care to limit infection risk and facilitate essential skin excursion or optimal lymphatic function Lymphatic Pumping there ex- 2 x daily, in sequence, 10 reps bilaterally. Hole 5 seconds each Multilayer compression wrapping: 1 each 8 and 10 cm wide short stretch bandage applied  using overlapping, gradient techniques starting at base of the and ending at popliteal fossa. Bandages applied over a single layer of 0.4 cm thick Rosidal foam and cotton stockinett underneath all. During Intensive Phase CDT patients undergo short stretch bandaging using gradient techniques one limb at a time, toes to groin During Self-management phase of CDT Pt's utilize appropriate compression garments and HOS devices that meet their individual needs, abilities and lifestyle.   Custom-made gradient compression garments and HOS devices are medically  necessary in this case because they are uniquely sized and shaped to fit the exact dimensions of the affected extremities with deformities, and to provide accurate and consistent gradient  compression and containment, essential to optimally managing this patient's symptoms of chronic, progressive lymphedema. Multiple custom compression garments are needed for optimal hygiene to limit infection risk. Custom compression garments should be replaced q 3-6 months When worn consistently for optimal lipo-lymphedema self-management over time.  ASSESSMENT: CLINICAL IMPRESSION: RLE comparative limb volumetrics reveal slight decreases in limb  volume for leg, thigh and full limb as follows: R leg (A-D) DECREASED by 0.5%. R thigh (E-G) DECREASED by 3.5%. RLE full limb volume is DECREASED 2.4%. Interestingly the greatest volume decrease has been in the thigh , even though we have not applied thigh length compression to date, and leg volume is essentially unchanged. Please review GOALS sections below for detailed progress to date in detail.    Continued MLD to RLE and RLQ as established with emphasis on de congesting thigh. Applied multilayer compression wraps to RLE as established. Pt denied increased pain on top of R foot at end of session. Cont as per POC.  NOTE: Medical necessity for Advanced , Sequential Pneumatic Flexitouch Device: Continued Pt edu re pneumatic compression devices throughout session. Ms Costella will benefit from a Flexitouch Plus advanced device to assist her with long term self-management of stage II, RLE/RLQ lymphedema, including abdomen, 2/2 treatment for endometrial cancer. Ms. Carsey has undergone skilled Occupational Therapy for conservative Complete Decongestive Therapy (CDT) 2 x weekly for greater than 4 weeks with significant signs/symptoms of chronic lymphedema persisting. CDT includes multi-layer compression with short stretch bandages during the Intensive Phase, appropriate compression garments once limb volume reduction plateaus in the self-management phase, therapeutic lymphatic pumping exercise, skin care daily to limit infection risk and progression, and daily manual  lymphatic drainage (MLD) and limb elevation. Pt is 100% compliant with all self-management home program components. She used a basic ([id number]) vaso pneumatic compression "pump" (model and serial number unknown)   for several weeks. This basic device provides squeeze and release compression  from the ankle to the proximal thigh only, and does not address this patient's groin and abdominal involvement  2/2 cancer Rx. The basic device failed to control chronic, progressive lymphedema in the right lower extremity and lower quadrant (abdomen and groin). Chronic swelling is unchanged, lymphedema-associated pain and discomfort persists, and high-protein tissue fibrosis is unchanged . Pt will benefit from the Flexitouch PLUS advanced [phone]) sequential pneumatic compression device to reduce limb volume and fibrosis, to reduce infection risk, and to optimally manage chronic, progressive lymphedema over time at home.   OBJECTIVE IMPAIRMENTS: decreased knowledge of condition, decreased knowledge of use of DME, chronic progressive BLE swelling, R>L , LE associated pain and skin changes,   ACTIVITY LIMITATIONS: gravity dependent sitting, standing and extended walking exacerbate limb swelling and associated pain limiting functional ambulation and mobility; limb swelling limits Pt's ability to fit street shoes and socks; BLE LE and pain limits social participation; LE limits body image, and  "gets me down" causing feelings of anger, depression or frustration.   PARTICIPATION LIMITATIONS: painting hobby, social participation in community activities requiring extended sitting, standing, walking  PERSONAL FACTORS: Time since onset of injury/illness/exacerbation and 1-2 comorbidities: (Ca hx w XRT, HTN)  are also affecting patient's functional outcome.   REHAB POTENTIAL: Good    GOALS: Goals reviewed with patient? Yes  SHORT TERM GOALS: Target date: 4th OT Rx visit  Pt  will demonstrate understanding of lymphedema  precautions and prevention strategies with modified independence using a printed reference to identify at least 5 precautions and discussing how s/he may implement them into daily life to reduce risk of progression with extra time. Baseline:Max A Goal status: 05/07/23 PROGRESSING- additional time needed to complete  2.  Pt will be able to apply multilayer, thigh length, gradient, compression wraps to one leg at a time with modified assistance (extra time) to decrease limb volume, to limit infection risk, and to limit lymphedema progression.  Baseline: Max A Goal status: 05/07/23 GOAL MET   LONG TERM GOALS: Target date: 06/20/23  1.  Given this patient's Intake score of 8.82 % on the Lymphedema Life Impact Scale (LLIS), patient will experience a reduction of at least 5 points in her perceived level of functional impairment resulting from lymphedema to improve functional performance and quality of life (QOL). Baseline: 8.82% Goal status: 05/07/23 ONGOING  3.   During Intensive phase CDT Pt will achieve at least 85% compliance with all lymphedema self-care home program components, including daily skin care, compression wraps and /or garments, simple self MLD and lymphatic pumping therex to habituate LE self care protocol  into ADLs for optimal LE self-management over time. Baseline: Max A Goal status: 05/07/23 GOAL MET  4.  Pt will achieve at least a 10% volume reduction in the LLE to return limb to typical size and shape, to limit infection risk and LE progression, to decrease pain, to improve function. Baseline: Dependent Goal status: 05/07/23 PROGRESSING  5.  Pt will obtain proper, recommended compression garments/devices and be modified independent with donning/doffing to optimize limb volume reduction and limit LE  progression over time. Baseline:  Goal status: 05/07/23 ONGOING  PLAN:  PT FREQUENCY: 2x/week and PRN  PT DURATION: 12 weeks and PRN  PLANNED INTERVENTIONS:  Complete  Decongestive Therapy: Therapeutic lymphatic pumping exercises, Manual lymph drainage (MLD), Multilayer, gradient Compression bandaging, Manual therapy, skin care to limit infection risk, scar massage; During self management phase Pt will be fit with appropriate compression garments and devices,  Therapeutic activities Patient/Family education for LE Self Care home program  PLAN FOR NEXT SESSION:  LLE knee length multilayer compression wraps Continue LE self care home program edu- wrapping and simple self-mld MLD to RLE/RLQ  Loel Dubonnet, MS, OTR/L, CLT-LANA 05/07/23 11:39 AM

## 2023-05-10 ENCOUNTER — Ambulatory Visit: Payer: Medicare PPO | Admitting: Occupational Therapy

## 2023-05-10 DIAGNOSIS — I890 Lymphedema, not elsewhere classified: Secondary | ICD-10-CM | POA: Diagnosis not present

## 2023-05-10 NOTE — Therapy (Signed)
OUTPATIENT OCCUPATIONAL THERAPY TREATMENT NOTE  LOWER EXTREMITY LYMPHEDEMA  Patient Name: Barbara Burgess MRN: [id number] DOB:17-Nov-1946, 77 y.o., female Today's Date: 05/10/2023  END OF SESSION:   OT End of Session - 05/10/23 1409     Visit Number 11    Number of Visits 36    Date for OT Re-Evaluation 06/19/23    OT Start Time 0104    OT Stop Time 0208    OT Time Calculation (min) 64 min    Activity Tolerance Patient tolerated treatment well;No increased pain    Behavior During Therapy WFL for tasks assessed/performed               Past Medical History:  Diagnosis Date   Arthritis    knee and hip,  per [facility].    Bilateral bunions    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HCC)     per [facility] Patient Packet.    Colitis    H/O CT scan of chest 11/27/2018   By Roland Rack, per [facility] Patient Packet.    H/O mammogram 11/27/2018   By Pearlie Oyster at [facility], per [facility] New Patient Packet.    History of CT scan of abdomen 11/27/2018   By Roland Rack, per [facility] Patient Packet.    History of upper extremity x-ray 11/27/2018   By Lonny Prude, of Shoulder, per [facility] Patient Packet.    History of x-ray of hip 11/27/2018   By Lonny Prude, of Hip, per [facility] New Patient Packet.    Hyperlipemia     per [facility] Patient Packet.    Hypertension     per [facility] New Patient Packet.    Impingement syndrome of shoulder     per [facility] New Patient Packet.    Incontinence    Mild Nighttime per [facility] New Patient Packet.   Insomnia    Melanoma (HCC)    right lower leg; right ovary   Osteoarthritis of multiple joints    Rosacea     per [facility] Patient Packet.    Sleep disorder    Past Surgical History:  Procedure Laterality Date   ABDOMINAL HYSTERECTOMY  11/28/1987   Partial per [facility] Patient Packet   ARTHROSCOPIC REPAIR ACL Left 11/28/2003   Left Knee/ Torn Medial Meniscus per [facility] New Patient Packet   BREAST BIOPSY     CATARACT EXTRACTION W/ INTRAOCULAR  LENS IMPLANT Bilateral    COLONOSCOPY  11/27/2016   By Liberty Handy,  per [facility] Patient Packet.    COLONOSCOPY  11/27/2017   By Liberty Handy, per [facility] Patient Packet.    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   Procedure: TOTAL KNEE ARTHROPLASTY;  Surgeon: Juanell Fairly, MD;  Location: [facility] ORS;  Service: Orthopedics;  Laterality: Left;   TUBAL LIGATION  1976   Patient Active Problem List   Diagnosis Date Noted   Chronic venous insufficiency 03/25/2023   Lymphedema 12/24/2022   S/P TKR (total knee replacement) using cement, left 12/20/2021   Sesamoiditis 05/06/2020   Hav (hallux abducto valgus), unspecified laterality 01/08/2020   Porokeratosis 01/08/2020   Insomnia  Rosacea    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HCC)    History of melanoma    Hypertension    Hyperlipemia    Osteoarthritis of multiple joints    Arthritis of knee, left 07/14/2016   Foot pain 03/17/2013   Melanoma of skin (HCC) 09/13/2011    PCP: Stann Mainland. Sampson Goon, MD  REFERRING PROVIDER: Sheppard Plumber, NP  REFERRING DIAG: I89.0  THERAPY DIAG:  Lymphedema, not elsewhere classified [I89.0]  Rationale for Evaluation and Treatment: Rehabilitation  ONSET DATE: Onset of RLE lymphedema s/p XRT for R melanoma recurrence in 2012  SUBJECTIVE:                                                                                                                                                                                           SUBJECTIVE STATEMENT:Barbara Burgess presents to  OT for modified  Intensive Phase CDT to cancer-related  BLE lymphedema, R>L. Pt  tolerated knee length, RLE, compression wraps during visit interval  without increased pain. Pt reports intermittent "stinging" pain on the top of both feet, which is thought to be neuropathy. Pt states she believes compression makes this worse. Pt states she has an appointment with referring provider and [employer] medical rep tomorrow  to complete trial with basic sequential edema pump.   PERTINENT HISTORY:   relevant to LE: Takes NORVASC, HTN, L knee OA, hysterectomy, 1976 Tubal ligation, former smoker;  Oncology History 09/05/2011 Exploratory laparotomy and debulking of right pelvic sidewall mass.~ 2. Bilateral salpingo-oophorectomy.~ 3. Right pelvic lymph node sampling.~~~; Path: Right pelvic sidewall mass and right fallopian tube and ovary, excision; - Metastatic malignant melanoma involving matted lymph nodes with extracapsular; extension, size: 9.0 x 7.5 x 4.5 cm; ; Braf neg;  Stage III or stage IV melanoma (we cannot tell if this is a locally advanced stage III cancer or stage IV) of the right lower leg with metastasis to the right pelvic lymph nodes and ovaries.  Fall 2012: Radiation therapy L groin and LLQ  PAIN:  Are you having pain? Yes: NPRS scale: not rated/10 Pain location: BLE, R>L Pain description: heavy, tight, full, neuropathic pain on top of R foot Aggravating factors: dependent sitting, standing, walking, hot weather Relieving factors: compression, elevation  PRECAUTIONS: Other: Lymphedema precautions; Hx melanoma  HAND DOMINANCE: right   PRIOR LEVEL OF FUNCTION: Independent  PATIENT GOALS: Learn how to best manage my lymphedema to keep it from getting worse   OBJECTIVE:  OBSERVATIONS / OTHER ASSESSMENTS:  Moderate, Stage  II, Cancer-related, Left Lower Extremity/ Left Lower Quadrant Lymphedema   Lymphedema Life Impact Scale (LLIS): 8.82% (The extent to which LE-related problems affected  your life in the last week)   RLE COMPARATIVE LIMB VOLUMETRICS: 10th Visit 05/07/23  LANDMARK RIGHT    R LEG (A-D) 3625.8  ml  R THIGH (E-G)  5896.1 ml  R FULL LIMB (A-G) 9522.0 ml  Limb Volume differential (LVD)  %  Volume change since last measured on 03/29/23 R leg (A-D) DECREASED by 0.5%. R thigh (E-G) DECREASED by 3.5%. RLE full limb volume is DECREASED 2.4%.   Volume change since initial   (Blank rows = not tested)  BLE COMPARATIVE LIMB VOLUMETRICS: Initial 03/29/23  LANDMARK RIGHT    R LEG (A-D) 3644.0 ml  R THIGH (E-G) 6111.3 ml  R FULL LIMB (A-G) 9755.2 ml  Limb Volume differential (LVD)  %  Volume change since initial %  Volume change overall V  (Blank rows = not tested)  LANDMARK LEFT   L LEG (A-D) 4076.7 ml  L THIGH (E-G) 6365.5 ml  L FULL LIMB (A-G) 10443.15 ml  Limb Volume differential (LVD)  LEG LVD= 12.9%, L>R Thigh LVD =4.2 %. L>R Full LIMB LVD = 7.05%, L>R  Volume change since initial %  Volume change overall %  (Blank rows = not tested)     TODAY'S TREATMENT:    RLE/RLQ MLD utilizing established techniques RLE thigh length, multilayer compression wrap  PATIENT EDUCATION: Continued Pt/ CG edu for lymphedema self care home program throughout session. Topics include outcome of comparative limb volumetrics- starting limb volume differentials (LVDs), technology and gradient techniques used for short stretch, multilayer compression wrapping, simple self-MLD, therapeutic lymphatic pumping exercises, skin/nail care, LE precautions,. compression garment recommendations and specifications, wear and care schedule and compression garment donning / doffing w assistive devices. Discussed progress towards all OT goals since commencing CDT. All questions answered to the Pt's satisfaction. Good return.  FLEXITOUCH PRECAUTIONS: Pt education re precautions related to use of advanced vaso-pneumatic device. Pt verbalized understanding that she should never use device on 2 legs simultaneously and should not use device 2 x on same day to avoid overloading her heart with fluid volume return both at present, and in years to  come should her medical condition change. Pt instructed to remove device immediately should she experience atypical SOP, light headedness, or acute pain. Pt instructed to discontinue pump if she suspects, or has any infection, blood clot, cellulitis, the flu, corona virus, etc. Person educated: Patient Education method: Explanation, Demonstration, Verbal and Tactile cues, and Handouts Education comprehension: verbalized understanding, returned demonstration, verbal cues required, and needs further education  LYMPHEDEMA SELF-CARE HOME PROGRAM: all to be performed daily and ongoing as LE is a chronic, progressive condition with no cure RLE/RLQ Simple Self-Manual Lymphatic Drainage (MLD): In supine and side lying utilizing bilateral short neck sequence, deep abdominal pathways via diaphragmatic breathing, ipsilateral axillary inguinal  anastomosis, then inguinal LN, and dynamic J strokes from proximal to distal staring at groin and thigh, then knee, then leg segment, and finally  retrograde sweeps back to terminus. Skin care to limit infection risk and facilitate essential skin excursion or optimal lymphatic function Lymphatic Pumping there ex- 2 x daily, in sequence, 10 reps bilaterally. Hole 5 seconds each Multilayer compression wrapping: 1 each 8 and 10 cm wide short stretch bandage applied  using overlapping, gradient techniques starting at base of the and ending at popliteal fossa. Bandages applied over a single layer of 0.4 cm thick Rosidal foam and cotton stockinett underneath all. During Intensive Phase CDT patients undergo short stretch bandaging using gradient techniques one limb at  a time, toes to groin During Self-management phase of CDT Pt's utilize appropriate compression garments and HOS devices that meet their individual needs, abilities and lifestyle.   Custom-made gradient compression garments and HOS devices are medically necessary in this case because they are uniquely sized and shaped  to fit the exact dimensions of the affected extremities with deformities, and to provide accurate and consistent gradient compression and containment, essential to optimally managing this patient's symptoms of chronic, progressive lymphedema. Multiple custom compression garments are needed for optimal hygiene to limit infection risk. Custom compression garments should be replaced q 3-6 months When worn consistently for optimal lipo-lymphedema self-management over time.  ASSESSMENT: CLINICAL IMPRESSION:  Continued MLD to RLE and RLQ as established with emphasis on de congesting thigh. Applied multilayer thigh length compression wraps to RLE as established. Provided Pt edu throughout session re LE self care and Flexitouch precautions.. Cont as per POC.  NOTE: Medical necessity for Advanced , Sequential Pneumatic Flexitouch Device: Continued Pt edu re pneumatic compression devices throughout session. Ms Buzzetta will benefit from a Flexitouch Plus advanced device to assist her with long term self-management of stage II, RLE/RLQ lymphedema, including abdomen, 2/2 treatment for endometrial cancer. Ms. Sanroman has undergone skilled Occupational Therapy for conservative Complete Decongestive Therapy (CDT) 2 x weekly for greater than 4 weeks with significant signs/symptoms of chronic lymphedema persisting. CDT includes multi-layer compression with short stretch bandages during the Intensive Phase, appropriate compression garments once limb volume reduction plateaus in the self-management phase, therapeutic lymphatic pumping exercise, skin care daily to limit infection risk and progression, and daily manual lymphatic drainage (MLD) and limb elevation. Pt is 100% compliant with all self-management home program components. She used a basic ([id number]) vaso pneumatic compression "pump" (model and serial number unknown)   for several weeks. This basic device provides squeeze and release compression  from the ankle to the proximal  thigh only, and does not address this patient's groin and abdominal involvement  2/2 cancer Rx. The basic device failed to control chronic, progressive lymphedema in the right lower extremity and lower quadrant (abdomen and groin). Chronic swelling is unchanged, lymphedema-associated pain and discomfort persists, and high-protein tissue fibrosis is unchanged . Pt will benefit from the Flexitouch PLUS advanced [id number]) sequential pneumatic compression device to reduce limb volume and fibrosis, to reduce infection risk, and to optimally manage chronic, progressive lymphedema over time at home.   OBJECTIVE IMPAIRMENTS: decreased knowledge of condition, decreased knowledge of use of DME, chronic progressive BLE swelling, R>L , LE associated pain and skin changes,   ACTIVITY LIMITATIONS: gravity dependent sitting, standing and extended walking exacerbate limb swelling and associated pain limiting functional ambulation and mobility; limb swelling limits Pt's ability to fit street shoes and socks; BLE LE and pain limits social participation; LE limits body image, and  "gets me down" causing feelings of anger, depression or frustration.   PARTICIPATION LIMITATIONS: painting hobby, social participation in community activities requiring extended sitting, standing, walking  PERSONAL FACTORS: Time since onset of injury/illness/exacerbation and 1-2 comorbidities: (Ca hx w XRT, HTN)  are also affecting patient's functional outcome.   REHAB POTENTIAL: Good    GOALS: Goals reviewed with patient? Yes  SHORT TERM GOALS: Target date: 4th OT Rx visit  Pt will demonstrate understanding of lymphedema precautions and prevention strategies with modified independence using a printed reference to identify at least 5 precautions and discussing how s/he may implement them into daily life to reduce risk of progression with extra time. Baseline:Max A  Goal status: 05/07/23 PROGRESSING- additional time needed to complete  2.   Pt will be able to apply multilayer, thigh length, gradient, compression wraps to one leg at a time with modified assistance (extra time) to decrease limb volume, to limit infection risk, and to limit lymphedema progression.  Baseline: Max A Goal status: 05/07/23 GOAL MET   LONG TERM GOALS: Target date: 06/20/23  1.  Given this patient's Intake score of 8.82 % on the Lymphedema Life Impact Scale (LLIS), patient will experience a reduction of at least 5 points in her perceived level of functional impairment resulting from lymphedema to improve functional performance and quality of life (QOL). Baseline: 8.82% Goal status: 05/07/23 ONGOING  3.   During Intensive phase CDT Pt will achieve at least 85% compliance with all lymphedema self-care home program components, including daily skin care, compression wraps and /or garments, simple self MLD and lymphatic pumping therex to habituate LE self care protocol  into ADLs for optimal LE self-management over time. Baseline: Max A Goal status: 05/07/23 GOAL MET  4.  Pt will achieve at least a 10% volume reduction in the LLE to return limb to typical size and shape, to limit infection risk and LE progression, to decrease pain, to improve function. Baseline: Dependent Goal status: 05/07/23 PROGRESSING  5.  Pt will obtain proper, recommended compression garments/devices and be modified independent with donning/doffing to optimize limb volume reduction and limit LE  progression over time. Baseline:  Goal status: 05/07/23 ONGOING  PLAN:  PT FREQUENCY: 2x/week and PRN  PT DURATION: 12 weeks and PRN  PLANNED INTERVENTIONS:  Complete Decongestive Therapy: Therapeutic lymphatic pumping exercises, Manual lymph drainage (MLD), Multilayer, gradient Compression bandaging, Manual therapy, skin care to limit infection risk, scar massage; During self management phase Pt will be fit with appropriate compression garments and devices,  Therapeutic  activities Patient/Family education for LE Self Care home program  PLAN FOR NEXT SESSION:  LLE knee length multilayer compression wraps Continue LE self care home program edu- wrapping and simple self-mld MLD to RLE/RLQ  Loel Dubonnet, MS, OTR/L, CLT-LANA 05/10/23 2:12 PM

## 2023-05-14 ENCOUNTER — Ambulatory Visit: Payer: Medicare PPO | Admitting: Occupational Therapy

## 2023-05-14 ENCOUNTER — Encounter: Payer: Medicare PPO | Admitting: Occupational Therapy

## 2023-05-14 DIAGNOSIS — I890 Lymphedema, not elsewhere classified: Secondary | ICD-10-CM | POA: Diagnosis not present

## 2023-05-14 NOTE — Therapy (Signed)
OUTPATIENT OCCUPATIONAL THERAPY TREATMENT NOTE  LOWER EXTREMITY LYMPHEDEMA  Patient Name: Barbara Burgess MRN: [id number] DOB:23-Oct-1946, 77 y.o., female Today's Date: 05/14/2023  END OF SESSION:   OT End of Session - 05/14/23 1004     Visit Number 12    Number of Visits 36    Date for OT Re-Evaluation 06/19/23    OT Start Time 0955    OT Stop Time 1105    OT Time Calculation (min) 70 min    Activity Tolerance Patient tolerated treatment well;No increased pain    Behavior During Therapy WFL for tasks assessed/performed               Past Medical History:  Diagnosis Date   Arthritis    knee and hip,  per [facility].    Bilateral bunions    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HCC)     per [facility] Patient Packet.    Colitis    H/O CT scan of chest 11/27/2018   By Roland Rack, per [facility] Patient Packet.    H/O mammogram 11/27/2018   By Pearlie Oyster at [facility], per [facility] New Patient Packet.    History of CT scan of abdomen 11/27/2018   By Roland Rack, per [facility] Patient Packet.    History of upper extremity x-ray 11/27/2018   By Lonny Prude, of Shoulder, per [facility] Patient Packet.    History of x-ray of hip 11/27/2018   By Lonny Prude, of Hip, per [facility] Patient Packet.    Hyperlipemia     per [facility] Patient Packet.    Hypertension     per [facility] Patient Packet.    Impingement syndrome of shoulder     per [facility] Patient Packet.    Incontinence    Mild Nighttime per [facility] New Patient Packet.   Insomnia    Melanoma (HCC)    right lower leg; right ovary   Osteoarthritis of multiple joints    Rosacea     per [facility] Patient Packet.    Sleep disorder    Past Surgical History:  Procedure Laterality Date   ABDOMINAL HYSTERECTOMY  11/28/1987   Partial per [facility] Packet   ARTHROSCOPIC REPAIR ACL Left 11/28/2003   Left Knee/ Torn Medial Meniscus per [facility] New Patient Packet   BREAST BIOPSY     CATARACT EXTRACTION W/ INTRAOCULAR  LENS IMPLANT Bilateral    COLONOSCOPY  11/27/2016   By Liberty Handy,  per [facility] Patient Packet.    COLONOSCOPY  11/27/2017   By Liberty Handy, per [facility] Patient Packet.    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   Procedure: TOTAL KNEE ARTHROPLASTY;  Surgeon: Juanell Fairly, MD;  Location: [facility] ORS;  Service: Orthopedics;  Laterality: Left;   TUBAL LIGATION  1976   Patient Active Problem List   Diagnosis Date Noted   Chronic venous insufficiency 03/25/2023   Lymphedema 12/24/2022   S/P TKR (total knee replacement) using cement, left 12/20/2021   Sesamoiditis 05/06/2020   Hav (hallux abducto valgus), unspecified laterality 01/08/2020   Porokeratosis 01/08/2020   Insomnia  Rosacea    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HCC)    History of melanoma    Hypertension    Hyperlipemia    Osteoarthritis of multiple joints    Arthritis of knee, left 07/14/2016   Foot pain 03/17/2013   Melanoma of skin (HCC) 09/13/2011    PCP: Stann Mainland. Sampson Goon, MD  REFERRING PROVIDER: Sheppard Plumber, NP  REFERRING DIAG: I89.0  THERAPY DIAG:  Lymphedema, not elsewhere classified [I89.0]  Rationale for Evaluation and Treatment: Rehabilitation  ONSET DATE: Onset of RLE lymphedema s/p XRT for R melanoma recurrence in 2012  SUBJECTIVE:                                                                                                                                                                                           SUBJECTIVE STATEMENT:Barbara Burgess presents to  OT for Intensive Phase CDT to cancer-related  BLE lymphedema, R>L. Pt  tolerated thigh length, RLE, compression wraps during visit interval without  increased pain, but does report some queasiness and some lightheadedness immediately after applying wraps at home. Pt denies SOB  when wearing or applying thigh length compression. Pt also concerned about mild redness circumferentially at R mid leg. Pt denies signs/ symptoms of infection or other known illness.   PERTINENT HISTORY:   relevant to LE: Takes NORVASC, HTN, L knee OA, hysterectomy, 1976 Tubal ligation, former smoker; CVI dx 2024  Oncology History 09/05/2011 Exploratory laparotomy and debulking of right pelvic sidewall mass.~ 2. Bilateral salpingo-oophorectomy.~ 3. Right pelvic lymph node sampling.~~~; Path: Right pelvic sidewall mass and right fallopian tube and ovary, excision; - Metastatic malignant melanoma involving matted lymph nodes with extracapsular; extension, size: 9.0 x 7.5 x 4.5 cm; ; Braf neg;  Stage III or stage IV melanoma (we cannot tell if this is a locally advanced stage III cancer or stage IV) of the right lower leg with metastasis to the right pelvic lymph nodes and ovaries.  Fall 2012: Radiation therapy L groin and LLQ  PAIN:  Are you having pain? Yes: NPRS scale: not rated/10 Pain location: BLE, R>L Pain description: heavy, tight, full, neuropathic pain on top of R foot Aggravating factors: dependent sitting, standing, walking, hot weather Relieving factors: compression, elevation  PRECAUTIONS: Other: Lymphedema precautions; Hx melanoma  HAND DOMINANCE: right   PRIOR LEVEL OF FUNCTION: Independent  PATIENT GOALS: Learn how to best manage my lymphedema to keep it from getting worse   OBJECTIVE:  OBSERVATIONS / OTHER ASSESSMENTS:  Moderate, Stage  II, Cancer-related, Left Lower Extremity/ Left Lower Quadrant Lymphedema   Lymphedema Life Impact Scale (LLIS): 8.82% (The extent to which LE-related problems affected your  life in the last week)   RLE COMPARATIVE LIMB VOLUMETRICS: 10th Visit 05/07/23  LANDMARK RIGHT    R LEG (A-D) 3625.8  ml  R THIGH  (E-G) 5896.1 ml  R FULL LIMB (A-G) 9522.0 ml  Limb Volume differential (LVD)  %  Volume change since last measured on 03/29/23 R leg (A-D) DECREASED by 0.5%. R thigh (E-G) DECREASED by 3.5%. RLE full limb volume is DECREASED 2.4%.   Volume change since initial   (Blank rows = not tested)  BLE COMPARATIVE LIMB VOLUMETRICS: Initial 03/29/23  LANDMARK RIGHT    R LEG (A-D) 3644.0 ml  R THIGH (E-G) 6111.3 ml  R FULL LIMB (A-G) 9755.2 ml  Limb Volume differential (LVD)  %  Volume change since initial %  Volume change overall V  (Blank rows = not tested)  LANDMARK LEFT   L LEG (A-D) 4076.7 ml  L THIGH (E-G) 6365.5 ml  L FULL LIMB (A-G) 10443.15 ml  Limb Volume differential (LVD)  LEG LVD= 12.9%, L>R Thigh LVD =4.2 %. L>R Full LIMB LVD = 7.05%, L>R  Volume change since initial %  Volume change overall %  (Blank rows = not tested)     TODAY'S TREATMENT:   Pt edu for simple self MLD RLE/RLQ MLD utilizing established techniques RLE thigh length, multilayer compression wrap  PATIENT EDUCATION:  Continued Pt/ CG edu for lymphedema self care home program throughout session. Topics include outcome of comparative limb volumetrics- starting limb volume differentials (LVDs), technology and gradient techniques used for short stretch, multilayer compression wrapping, simple self-MLD, therapeutic lymphatic pumping exercises, skin/nail care, LE precautions,. compression garment recommendations and specifications, wear and care schedule and compression garment donning / doffing w assistive devices. Discussed progress towards all OT goals since commencing CDT. All questions answered to the Pt's satisfaction. Good return.  FLEXITOUCH PRECAUTIONS: Pt education re precautions related to use of advanced vaso-pneumatic device. Pt verbalized understanding that she should never use device on 2 legs simultaneously and should not use device 2 x on same day to avoid overloading her heart with fluid volume return  both at present, and in years to come should her medical condition change. Pt instructed to remove device immediately should she experience atypical SOP, light headedness, or acute pain. Pt instructed to discontinue pump if she suspects, or has any infection, blood clot, cellulitis, the flu, corona virus, etc. Person educated: Patient Education method: Explanation, Demonstration, Verbal and Tactile cues, and Handouts Education comprehension: verbalized understanding, returned demonstration, verbal cues required, and needs further education  LYMPHEDEMA SELF-CARE HOME PROGRAM: all to be performed daily and ongoing as LE is a chronic, progressive condition with no cure RLE/RLQ Simple Self-Manual Lymphatic Drainage (MLD): In supine and side lying utilizing bilateral short neck sequence, deep abdominal pathways via diaphragmatic breathing, ipsilateral axillary inguinal  anastomosis, then inguinal LN, and dynamic J strokes from proximal to distal staring at groin and thigh, then knee, then leg segment, and finally  retrograde sweeps back to terminus. Skin care to limit infection risk and facilitate essential skin excursion or optimal lymphatic function Lymphatic Pumping there ex- 2 x daily, in sequence, 10 reps bilaterally. Hole 5 seconds each Multilayer compression wrapping: 1 each 8 and 10 cm wide short stretch bandage applied  using overlapping, gradient techniques starting at base of the and ending at popliteal fossa. Bandages applied over a single layer of 0.4 cm thick Rosidal foam and cotton stockinett underneath all. During Intensive Phase CDT patients undergo short stretch bandaging using  gradient techniques one limb at a time, toes to groin During Self-management phase of CDT Pt's utilize appropriate compression garments and HOS devices that meet their individual needs, abilities and lifestyle.   Custom-made gradient compression garments and HOS devices are medically necessary in this case because  they are uniquely sized and shaped to fit the exact dimensions of the affected extremities with deformities, and to provide accurate and consistent gradient compression and containment, essential to optimally managing this patient's symptoms of chronic, progressive lymphedema. Multiple custom compression garments are needed for optimal hygiene to limit infection risk. Custom compression garments should be replaced q 3-6 months When worn consistently for optimal lipo-lymphedema self-management over time.  ASSESSMENT: CLINICAL IMPRESSION:  Emphasis of self care training today on lymphatic structure and  functional anastomoses to explain alternative MLD pathways, including ipsilateral IA anastomosis, to offload congested RLQ/RLE. Typical lymphatic function of the body, including watershed map also used to explain proximal to distal lymphatic return and how Flexitouch incorporates this model, vs "squeeze and release" of basic pneumatic device. Good return. Pt verbalized understanding and will practice between visits. Continued MLD to RLE and RLQ as established with emphasis on de congesting thigh. Applied multilayer thigh length compression wraps to RLE as established.  Cont as per POC.  NOTE: Medical necessity for Advanced , Sequential Pneumatic Flexitouch Device: Continued Pt edu re pneumatic compression devices throughout session. Ms Kleinman will benefit from a Flexitouch Plus advanced device to assist her with long term self-management of stage II, RLE/RLQ lymphedema, including abdomen, 2/2 treatment for endometrial cancer. Ms. Tryba has undergone skilled Occupational Therapy for conservative Complete Decongestive Therapy (CDT) 2 x weekly for greater than 4 weeks with significant signs/symptoms of chronic lymphedema persisting. CDT includes multi-layer compression with short stretch bandages during the Intensive Phase, appropriate compression garments once limb volume reduction plateaus in the self-management  phase, therapeutic lymphatic pumping exercise, skin care daily to limit infection risk and progression, and daily manual lymphatic drainage (MLD) and limb elevation. Pt is 100% compliant with all self-management home program components. She used a basic ([id number]) vaso pneumatic compression "pump" (model and serial number unknown)   for several weeks. This basic device provides squeeze and release compression  from the ankle to the proximal thigh only, and does not address this patient's groin and abdominal involvement  2/2 cancer Rx. The basic device failed to control chronic, progressive lymphedema in the right lower extremity and lower quadrant (abdomen and groin). Chronic swelling is unchanged, lymphedema-associated pain and discomfort persists, and high-protein tissue fibrosis is unchanged . Pt will benefit from the Flexitouch PLUS advanced [phone]) sequential pneumatic compression device to reduce limb volume and fibrosis, to reduce infection risk, and to optimally manage chronic, progressive lymphedema over time at home.   OBJECTIVE IMPAIRMENTS: decreased knowledge of condition, decreased knowledge of use of DME, chronic progressive BLE swelling, R>L , LE associated pain and skin changes,   ACTIVITY LIMITATIONS: gravity dependent sitting, standing and extended walking exacerbate limb swelling and associated pain limiting functional ambulation and mobility; limb swelling limits Pt's ability to fit street shoes and socks; BLE LE and pain limits social participation; LE limits body image, and  "gets me down" causing feelings of anger, depression or frustration.   PARTICIPATION LIMITATIONS: painting hobby, social participation in community activities requiring extended sitting, standing, walking  PERSONAL FACTORS: Time since onset of injury/illness/exacerbation and 1-2 comorbidities: (Ca hx w XRT, HTN)  are also affecting patient's functional outcome.   REHAB POTENTIAL: Good  GOALS: Goals reviewed  with patient? Yes  SHORT TERM GOALS: Target date: 4th OT Rx visit  Pt will demonstrate understanding of lymphedema precautions and prevention strategies with modified independence using a printed reference to identify at least 5 precautions and discussing how s/he may implement them into daily life to reduce risk of progression with extra time. Baseline:Max A Goal status: 05/07/23 PROGRESSING- additional time needed to complete  2.  Pt will be able to apply multilayer, thigh length, gradient, compression wraps to one leg at a time with modified assistance (extra time) to decrease limb volume, to limit infection risk, and to limit lymphedema progression.  Baseline: Max A Goal status: 05/07/23 GOAL MET   LONG TERM GOALS: Target date: 06/20/23  1.  Given this patient's Intake score of 8.82 % on the Lymphedema Life Impact Scale (LLIS), patient will experience a reduction of at least 5 points in her perceived level of functional impairment resulting from lymphedema to improve functional performance and quality of life (QOL). Baseline: 8.82% Goal status: 05/07/23 ONGOING  3.   During Intensive phase CDT Pt will achieve at least 85% compliance with all lymphedema self-care home program components, including daily skin care, compression wraps and /or garments, simple self MLD and lymphatic pumping therex to habituate LE self care protocol  into ADLs for optimal LE self-management over time. Baseline: Max A Goal status: 05/07/23 GOAL MET  4.  Pt will achieve at least a 10% volume reduction in the LLE to return limb to typical size and shape, to limit infection risk and LE progression, to decrease pain, to improve function. Baseline: Dependent Goal status: 05/07/23 PROGRESSING  5.  Pt will obtain proper, recommended compression garments/devices and be modified independent with donning/doffing to optimize limb volume reduction and limit LE  progression over time. Baseline:  Goal status: 05/07/23  ONGOING  PLAN:  PT FREQUENCY: 2x/week and PRN  PT DURATION: 12 weeks and PRN  PLANNED INTERVENTIONS:  Complete Decongestive Therapy: Therapeutic lymphatic pumping exercises, Manual lymph drainage (MLD), Multilayer, gradient Compression bandaging, Manual therapy, skin care to limit infection risk, scar massage; During self management phase Pt will be fit with appropriate compression garments and devices,  Therapeutic activities Patient/Family education for LE Self Care home program  PLAN FOR NEXT SESSION:  LLE knee length multilayer compression wraps Continue LE self care home program edu- wrapping and simple self-mld MLD to RLE/RLQ  Loel Dubonnet, MS, OTR/L, CLT-LANA 05/14/23 11:14 AM

## 2023-05-15 ENCOUNTER — Ambulatory Visit (INDEPENDENT_AMBULATORY_CARE_PROVIDER_SITE_OTHER): Payer: Medicare PPO | Admitting: Nurse Practitioner

## 2023-05-15 ENCOUNTER — Encounter (INDEPENDENT_AMBULATORY_CARE_PROVIDER_SITE_OTHER): Payer: Self-pay | Admitting: Nurse Practitioner

## 2023-05-15 VITALS — BP 132/72 | HR 66 | Resp 16 | Wt 178.0 lb

## 2023-05-15 DIAGNOSIS — I10 Essential (primary) hypertension: Secondary | ICD-10-CM

## 2023-05-15 DIAGNOSIS — E785 Hyperlipidemia, unspecified: Secondary | ICD-10-CM

## 2023-05-15 DIAGNOSIS — I890 Lymphedema, not elsewhere classified: Secondary | ICD-10-CM | POA: Diagnosis not present

## 2023-05-15 NOTE — Progress Notes (Incomplete)
Subjective:    Patient ID: Barbara Burgess, female    DOB: 1946-01-24, 77 y.o.   MRN: [id number] Chief Complaint  Patient presents with  . Follow-up    Lymphedema follow up    HPI  Review of Systems  Cardiovascular:  Positive for leg swelling.  All other systems reviewed and are negative.      Objective:   Physical Exam Vitals reviewed.  HENT:     Head: Normocephalic.  Cardiovascular:     Rate and Rhythm: Normal rate.  Pulmonary:     Effort: Pulmonary effort is normal.  Skin:    General: Skin is warm and dry.  Neurological:     Mental Status: She is alert and oriented to person, place, and time.  Psychiatric:        Mood and Affect: Mood normal.        Behavior: Behavior normal.        Thought Content: Thought content normal.        Judgment: Judgment normal.     BP 132/72 (BP Location: Left Arm)   Pulse 66   Resp 16   Wt 178 lb (80.7 kg)   BMI 30.55 kg/m   Past Medical History:  Diagnosis Date  . Arthritis    knee and hip,  per [facility].   . Bilateral bunions   . 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HCC)     per [facility].   . Colitis   . H/O CT scan of chest 11/27/2018   By Roland Rack, per [facility].   . H/O mammogram 11/27/2018   By [facility]lie [facility]yster at [facility], per [facility] New Patient Packet.   Marland Kitchen History of CT scan of abdomen 11/27/2018   By Roland Rack, per [facility].   Marland Kitchen History of upper extremity x-ray 11/27/2018   By Lonny Prude, of Shoulder, per [facility].   Marland Kitchen History of x-ray of hip 11/27/2018   By Lonny Prude, of Hip, per [facility].   . Hyperlipemia     per [facility].   . Hypertension     per [facility].   . Impingement syndrome of shoulder     per [facility].   . Incontinence    Mild Nighttime per [facility].  . Insomnia   . Melanoma (HCC)    right lower leg; right ovary  . Osteoarthritis of  multiple joints   . Rosacea     per [facility].   . Sleep disorder     Social History   Socioeconomic History  . Marital status: Widowed    Spouse name: Not on file  . Number of children: 2  . Years of education: Not on file  . Highest education level: Not on file  Occupational History  . Occupation: Production designer, theatre/television/film    Comment: Retired  Tobacco Use  . Smoking status: Former    Packs/day: 1    Types: Cigarettes    Quit date: 2006    Years since quitting: 18.4  . Smokeless tobacco: Never  Vaping Use  . Vaping Use: Never used  Substance and Sexual Activity  . Alcohol use: Yes    Comment: 2 a week  . Drug use: Never  . Sexual activity: Not on file  Other Topics Concern  . Not on file  Social History Narrative   Husband was [employer]  spent time overseas teaching   Son in [place]   Daughter in [place]         Has living will   Son is health care POA----daughter is alternate   Would accept resuscitation attempts   Doesn't want tube feeds if cognitively unaware      Per [facility] New Patient Packet, abstracted 12/09/19      Diet: Patient states that she tries to have a diet rich in fruit, vegetables, and whole grains. But needs to modify diet because of colitis.       Caffeine: Yes      Married, if yes what year: Widowed/ Married in 1968      Do you live in a house, apartment, assisted living, [place], trailer, ect: Villa at [employer] [facility], 1 person      Pets: 1 Dog   Highest Level of Education completed? B.A.      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       Exercise: Yes, daily walking          Living Will: Yes   DNR: Yes   POA/HPOA: Yes       Functional Status:   Do you have difficulty bathing or dressing yourself? No    Do you have difficulty preparing food or eating? No   Do you have difficulty managing your medications? No    Do you have  difficulty managing your finances? No    Do you have difficulty affording your medications? No   Social Determinants of Corporate investment banker Strain: Not on file  Food Insecurity: Not on file  Transportation Needs: Not on file  Physical Activity: Not on file  Stress: Not on file  Social Connections: Not on file  Intimate Partner Violence: Not on file    Past Surgical History:  Procedure Laterality Date  . ABDOMINAL HYSTERECTOMY  11/28/1987   Partial per [facility] New Patient [facility]  . ARTHROSCOPIC REPAIR ACL Left 11/28/2003   Left Knee/ Torn Medial Meniscus per [facility] New Patient Packet  . BREAST BIOPSY    . CATARACT EXTRACTION W/ INTRAOCULAR LENS IMPLANT Bilateral   . COLONOSCOPY  11/27/2016   By Liberty Handy,  per [facility].   . COLONOSCOPY  11/27/2017   By Liberty Handy, per [facility] Patient Packet.   Marland Kitchen HYSTEROTOMY  11/28/1987   per [facility]  . KNEE SURGERY    . MELANOMA EXCISION  11/27/1998   Right Shin per [facility] Patient Packet  . OOPHORECTOMY  11/27/2010   along with melanoma mass per [facility] New Patient Packet  . TONSILLECTOMY  11/27/1950   per [facility] New Patient Packet  . 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 TUBAL LIGATION  1976    Family History  Problem Relation Age of Onset  . Alzheimer's disease Mother   . Lung cancer Maternal Grandfather   . Heart attack Paternal Grandfather   . Diabetes Neg Hx     Allergies  Allergen Reactions  . Other Diarrhea  . Atorvastatin     Elevated Liver Enzymes.        Latest Ref Rng & Units 12/22/2021    6:13 AM 12/21/2021    4:11 AM 12/20/2021    3:01 PM  CBC  WBC 4.0 - 10.5 K/uL  9.4  15.6  14.2   Hemoglobin 12.0 - 15.0 g/dL 16.1  09.6  04.5   Hematocrit 36.0 - 46.0 % 30.0  34.1  39.7   Platelets 150 - 400 K/uL 191  220  242       CMP     Component  Value Date/Time   NA 138 12/21/2021 0411   NA 140 08/12/2020 0000   K 4.7 12/21/2021 0411   CL 108 12/21/2021 0411   CO2 24 12/21/2021 0411   GLUCOSE 157 (H) 12/21/2021 0411   BUN 14 12/21/2021 0411   BUN 13 08/12/2020 0000   CREATININE 0.66 12/21/2021 0411   CALCIUM 8.6 (L) 12/21/2021 0411   ALBUMIN 4.2 08/12/2020 0000   AST 36 (A) 08/12/2020 0000   ALT 57 (A) 08/12/2020 0000   ALKPHOS 85 08/12/2020 0000   GFRNONAA >60 12/21/2021 0411     No results found.     Assessment & Plan:   1. Lymphedema Recommend:   No surgery or intervention at this point in time.      The patient has been wearing compression for 1 year with no or little benefit.  The patient has been exercising daily for more than 12 weeks. The patient has been elevating and taking OTC pain medications for more than 12 weeks.  None of these have have eliminated the pain related to the lymphedema or the discomfort regarding excessive swelling and venous congestion.  She continues to have a progressive worsening of her lymphedema as well as a functional decline in ambulation, mobility and balance despite these conservative therapy techniques.   I have reviewed my discussion with the patient regarding lymphedema and why it  causes symptoms.  Patient will continue wearing graduated compression on a daily basis. The patient should put the compression on first thing in the morning and removing them in the evening. The patient should not sleep in the compression.    In addition, behavioral modification throughout the day will be continued.  This will include frequent elevation (such as in a recliner), use of over the counter pain medications as needed and exercise such as walking.   The systemic causes for chronic edema such as liver, kidney and cardiac etiologies do not appear to have significant changed over the past year.     The patient has chronic , severe lymphedema with hyperpigmentation of the skin and has done MLD,  skin care, medication, diet, exercise, elevation and compression for >12 weeks with no improvement.  She has used a basic vasopneumatic compression pump (W0981) during this timeframe and it has not addressed the patient's groin and abdominal involvement due to cancer treatments.  He has failed to control chronic, progressive lymphedema in her right lower extremity and lower quadrant, including her abdomen and groin.  I am recommending an E0652 Pneumatic Compression Device, as I feel she will benefit from this..  The patient still has stage 2 lymphedema and therefore, I believe that a lymph pump is needed to improve the control of the patient's lymphedema and improve the quality of life.  Additionally, a lymph pump is warranted because it will reduce the risk of cellulitis and ulceration in the future, as well as to prevent further functional decline.   Patient should follow-up in six months   2. Primary hypertension Continue antihypertensive medications as already ordered, these medications have been reviewed and there are no changes at this time.  3. Hyperlipidemia, unspecified hyperlipidemia type Continue statin as ordered and reviewed,  no changes at this time   Current Outpatient Medications on File Prior to Visit  Medication Sig Dispense Refill  . amLODipine (NORVASC) 2.5 MG tablet Take 2.5 mg by mouth daily.    Marland Kitchen azelastine (ASTELIN) 0.1 % nasal spray Place 2 sprays into both nostrils daily.    Marland Kitchen CALCIUM CARBONATE-VITAMIN D PO Take 2 tablets by mouth daily.    Marland Kitchen escitalopram (LEXAPRO) 10 MG tablet Take 10 mg by mouth daily.    . meloxicam (MOBIC) 7.5 MG tablet Take 7.5 mg by mouth daily.    . mesalamine (LIALDA) 1.2 g EC tablet Take 1.2 g by mouth in the morning and at bedtime.    . metroNIDAZOLE (METROCREAM) 0.75 % cream Apply 1 application topically daily.     . minoxidil (LONITEN) 2.5 MG tablet Take 2.5 mg by mouth daily.    . mirabegron ER (MYRBETRIQ) 25 MG TB24 tablet Take 25 mg by  mouth daily.    Marland Kitchen omeprazole (PRILOSEC) 20 MG capsule Take 20 mg by mouth as needed.    . rosuvastatin (CRESTOR) 10 MG tablet Take 1 tablet (10 mg total) by mouth daily. (Patient taking differently: Take 10 mg by mouth every evening.) 90 tablet 3  . traZODone (DESYREL) 100 MG tablet Take 1 tablet (100 mg total) by mouth at bedtime. 90 tablet 3   No current facility-administered medications on file prior to visit.    There are no Patient Instructions on file for this visit. No follow-ups on file.   Georgiana Spinner, NP

## 2023-05-15 NOTE — Progress Notes (Signed)
Subjective:    Patient ID: Barbara Burgess, female    DOB: 11/12/1946, 77 y.o.   MRN: [id number] Chief Complaint  Patient presents with  . Follow-up    Lymphedema follow up    HPI  Review of Systems     Objective:   Physical Exam  BP 132/72 (BP Location: Left Arm)   Pulse 66   Resp 16   Wt 178 lb (80.7 kg)   BMI 30.55 kg/m   Past Medical History:  Diagnosis Date  . Arthritis    knee and hip,  per [facility] New Patient Packet.   . Bilateral bunions   . 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HCC)     per [facility] New Patient Packet.   . Colitis   . H/O CT scan of chest 11/27/2018   By Roland Rack, per [facility] New Patient Packet.   . H/O mammogram 11/27/2018   By [facility]lie [facility]yster at [facility], per [facility] New Patient Packet.   Marland Kitchen History of CT scan of abdomen 11/27/2018   By Roland Rack, per [facility] New Patient Packet.   Marland Kitchen History of upper extremity x-ray 11/27/2018   By Lonny Prude, of Shoulder, per [facility] New Patient Packet.   Marland Kitchen History of x-ray of hip 11/27/2018   By Lonny Prude, of Hip, per [facility] Patient Packet.   . Hyperlipemia     per [facility] Patient Packet.   . Hypertension     per [facility] Patient Packet.   . Impingement syndrome of shoulder     per [facility] Packet.   . Incontinence    Mild Nighttime per [facility] Patient Packet.  . Insomnia   . Melanoma (HCC)    right lower leg; right ovary  . Osteoarthritis of multiple joints   . Rosacea     per [facility] Patient Packet.   . Sleep disorder     Social History   Socioeconomic History  . Marital status: Widowed    Spouse name: Not on file  . Number of children: 2  . Years of education: Not on file  . Highest education level: Not on file  Occupational History  . Occupation: Production designer, theatre/television/film    Comment: Retired  Tobacco Use  . Smoking status: Former    Packs/day: 1    Types: Cigarettes    Quit date: 2006    Years since quitting: 18.4  . Smokeless tobacco: Never  Vaping Use  . Vaping Use: Never used   Substance and Sexual Activity  . Alcohol use: Yes    Comment: 2 a week  . Drug use: Never  . Sexual activity: Not on file  Other Topics Concern  . 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 But needs to modify diet because of colitis.       Caffeine: Yes      Married, if yes what year: Widowed/ Married in 1968      Do you live in a house, apartment, assisted  living, condo, trailer, ect: Villa at [employer], 1 person      Pets: 1 Dog   Highest Level of Education completed? B.A.      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       Exercise: Yes, daily walking          Living Will: Yes   DNR: Yes   POA/HPOA: Yes       Functional Status:   Do you have difficulty bathing or dressing yourself? No    Do you have difficulty preparing food or eating? No   Do you have difficulty managing your medications? No    Do you have difficulty managing your finances? No    Do you have difficulty affording your medications? No   Social Determinants of Corporate investment banker Strain: Not on file  Food Insecurity: Not on file  Transportation Needs: Not on file  Physical Activity: Not on file  Stress: Not on file  Social Connections: Not on file  Intimate Partner Violence: Not on file    Past Surgical History:  Procedure Laterality Date  . ABDOMINAL HYSTERECTOMY  11/28/1987   Partial per [facility] New Patient Packet  . ARTHROSCOPIC REPAIR ACL Left 11/28/2003   Left Knee/ Torn  Medial Meniscus per [facility] New Patient Packet  . BREAST BIOPSY    . CATARACT EXTRACTION W/ INTRAOCULAR LENS IMPLANT Bilateral   . COLONOSCOPY  11/27/2016   By Liberty Handy,  per [facility] Patient Packet.   . COLONOSCOPY  11/27/2017   By Liberty Handy, per [facility] New Patient Packet.   Marland Kitchen HYSTEROTOMY  11/28/1987   per [facility] New Patient Packet  . KNEE SURGERY    . MELANOMA EXCISION  11/27/1998   Right Shin per [facility] New Patient Packet  . OOPHORECTOMY  11/27/2010   along with melanoma mass per [facility] New Patient Packet  . TONSILLECTOMY  11/27/1950   per [facility] New Patient Packet  . 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 TUBAL LIGATION  1976    Family History  Problem Relation Age of Onset  . Alzheimer's disease Mother   . Lung cancer Maternal Grandfather   . Heart attack Paternal Grandfather   . Diabetes Neg Hx     Allergies  Allergen Reactions  . Other Diarrhea  . Atorvastatin     Elevated Liver Enzymes.        Latest Ref Rng & Units 12/22/2021    6:13 AM 12/21/2021    4:11 AM 12/20/2021    3:01 PM  CBC  WBC 4.0 - 10.5 K/uL 9.4  15.6  14.2   Hemoglobin 12.0 - 15.0 g/dL 16.1  09.6  04.5   Hematocrit 36.0 - 46.0 % 30.0  34.1  39.7   Platelets 150 - 400 K/uL 191  220  242       CMP     Component Value Date/Time   NA 138 12/21/2021 0411   NA 140 08/12/2020 0000   K 4.7 12/21/2021 0411   CL 108 12/21/2021 0411   CO2 24 12/21/2021 0411   GLUCOSE 157 (H) 12/21/2021 0411   BUN 14 12/21/2021 0411   BUN 13 08/12/2020 0000   CREATININE 0.66 12/21/2021 0411   CALCIUM 8.6 (L) 12/21/2021 0411   ALBUMIN 4.2 08/12/2020 0000   AST  36 (A) 08/12/2020 0000   ALT 57 (A) 08/12/2020 0000   ALKPHOS 85 08/12/2020 0000   GFRNONAA >60 12/21/2021 0411     No results found.     Assessment & Plan:   1. Lymphedema Recommend:   No surgery or intervention at  this point in time.      The patient has been wearing compression for 1 year with no or little benefit.  The patient has been exercising daily for more than 12 weeks. The patient has been elevating and taking OTC pain medications for more than 12 weeks.  None of these have have eliminated the pain related to the lymphedema or the discomfort regarding excessive swelling and venous congestion.  She continues to have a progressive worsening of her lymphedema as well as a functional decline in ambulation, mobility and balance despite these conservative therapy techniques.   I have reviewed my discussion with the patient regarding lymphedema and why it  causes symptoms.  Patient will continue wearing graduated compression on a daily basis. The patient should put the compression on first thing in the morning and removing them in the evening. The patient should not sleep in the compression.    In addition, behavioral modification throughout the day will be continued.  This will include frequent elevation (such as in a recliner), use of over the counter pain medications as needed and exercise such as walking.   The systemic causes for chronic edema such as liver, kidney and cardiac etiologies do not appear to have significant changed over the past year.     The patient has chronic , severe lymphedema with hyperpigmentation of the skin and has done MLD, skin care, medication, diet, exercise, elevation and compression for >12 weeks with no improvement.  She has used a basic vasopneumatic compression pump (Z6109) during this timeframe and it has not addressed the patient's groin and abdominal involvement due to cancer treatments.  He has failed to control chronic, progressive lymphedema in her right lower extremity and lower quadrant, including her abdomen and groin.  I am recommending an E0652 Pneumatic Compression Device, as I feel she will benefit from this..  The patient still has stage 2 lymphedema and  therefore, I believe that a lymph pump is needed to improve the control of the patient's lymphedema and improve the quality of life.  Additionally, a lymph pump is warranted because it will reduce the risk of cellulitis and ulceration in the future, as well as to prevent further functional decline.   Patient should follow-up in six months   2. Primary hypertension Continue antihypertensive medications as already ordered, these medications have been reviewed and there are no changes at this time.  3. Hyperlipidemia, unspecified hyperlipidemia type Continue statin as ordered and reviewed, no changes at this time   Current Outpatient Medications on File Prior to Visit  Medication Sig Dispense Refill  . amLODipine (NORVASC) 2.5 MG tablet Take 2.5 mg by mouth daily.    Marland Kitchen azelastine (ASTELIN) 0.1 % nasal spray Place 2 sprays into both nostrils daily.    Marland Kitchen CALCIUM CARBONATE-VITAMIN D PO Take 2 tablets by mouth daily.    Marland Kitchen escitalopram (LEXAPRO) 10 MG tablet Take 10 mg by mouth daily.    . meloxicam (MOBIC) 7.5 MG tablet Take 7.5 mg by mouth daily.    . mesalamine (LIALDA) 1.2 g EC tablet Take 1.2 g by mouth in the morning and at bedtime.    . metroNIDAZOLE (METROCREAM) 0.75 % cream Apply 1  application topically daily.     . minoxidil (LONITEN) 2.5 MG tablet Take 2.5 mg by mouth daily.    . mirabegron ER (MYRBETRIQ) 25 MG TB24 tablet Take 25 mg by mouth daily.    Marland Kitchen omeprazole (PRILOSEC) 20 MG capsule Take 20 mg by mouth as needed.    . rosuvastatin (CRESTOR) 10 MG tablet Take 1 tablet (10 mg total) by mouth daily. (Patient taking differently: Take 10 mg by mouth every evening.) 90 tablet 3  . traZODone (DESYREL) 100 MG tablet Take 1 tablet (100 mg total) by mouth at bedtime. 90 tablet 3   No current facility-administered medications on file prior to visit.    There are no Patient Instructions on file for this visit. No follow-ups on file.   Georgiana Spinner, NP

## 2023-05-16 ENCOUNTER — Encounter: Payer: Medicare PPO | Admitting: Occupational Therapy

## 2023-05-16 ENCOUNTER — Encounter (INDEPENDENT_AMBULATORY_CARE_PROVIDER_SITE_OTHER): Payer: Self-pay | Admitting: Nurse Practitioner

## 2023-05-18 ENCOUNTER — Ambulatory Visit: Payer: Medicare PPO | Admitting: Occupational Therapy

## 2023-05-18 DIAGNOSIS — I890 Lymphedema, not elsewhere classified: Secondary | ICD-10-CM | POA: Diagnosis not present

## 2023-05-18 NOTE — Therapy (Signed)
OUTPATIENT OCCUPATIONAL THERAPY TREATMENT NOTE  LOWER EXTREMITY LYMPHEDEMA  Patient Name: Barbara Burgess MRN: [id number] DOB:1945/12/26, 77 y.o., female Today's Date: 05/18/2023  END OF SESSION:   OT End of Session - 05/18/23 1130     Visit Number 14    Number of Visits 36    Date for OT Re-Evaluation 06/19/23    OT Start Time 1100    OT Stop Time 1115    OT Time Calculation (min) 15 min    Activity Tolerance Patient tolerated treatment well;No increased pain    Behavior During Therapy WFL for tasks assessed/performed               Past Medical History:  Diagnosis Date   Arthritis    knee and hip,  per [facility].    Bilateral bunions    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HCC)     per [facility] New Patient Packet.    Colitis    H/O CT scan of chest 11/27/2018   By Roland Rack, per [facility] Patient Packet.    H/O mammogram 11/27/2018   By Pearlie Oyster at [facility], per [facility] New Patient Packet.    History of CT scan of abdomen 11/27/2018   By Roland Rack, per [facility] Patient Packet.    History of upper extremity x-ray 11/27/2018   By Lonny Prude, of Shoulder, per [facility] New Patient Packet.    History of x-ray of hip 11/27/2018   By Lonny Prude, of Hip, per [facility] New Patient Packet.    Hyperlipemia     per [facility] Patient Packet.    Hypertension     per [facility] Patient Packet.    Impingement syndrome of shoulder     per [facility] New Patient Packet.    Incontinence    Mild Nighttime per [facility] New Patient Packet.   Insomnia    Melanoma (HCC)    right lower leg; right ovary   Osteoarthritis of multiple joints    Rosacea     per [facility] New Patient Packet.    Sleep disorder    Past Surgical History:  Procedure Laterality Date   ABDOMINAL HYSTERECTOMY  11/28/1987   Partial per [facility] New Patient Packet   ARTHROSCOPIC REPAIR ACL Left 11/28/2003   Left Knee/ Torn Medial Meniscus per [facility] New Patient Packet   BREAST BIOPSY     CATARACT EXTRACTION W/ INTRAOCULAR  LENS IMPLANT Bilateral    COLONOSCOPY  11/27/2016   By Liberty Handy,  per [facility] New Patient Packet.    COLONOSCOPY  11/27/2017   By Liberty Handy, per [facility] New Patient Packet.    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   Procedure: TOTAL KNEE ARTHROPLASTY;  Surgeon: Juanell Fairly, MD;  Location: [facility] ORS;  Service: Orthopedics;  Laterality: Left;   TUBAL LIGATION  1976   Patient Active Problem List   Diagnosis Date Noted   Chronic venous insufficiency 03/25/2023   Lymphedema 12/24/2022   S/P TKR (total knee replacement) using cement, left 12/20/2021   Sesamoiditis 05/06/2020   Hav (hallux abducto valgus), unspecified laterality 01/08/2020   Porokeratosis 01/08/2020   Insomnia  Rosacea    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HCC)    History of melanoma    Hypertension    Hyperlipemia    Osteoarthritis of multiple joints    Arthritis of knee, left 07/14/2016   Foot pain 03/17/2013   Melanoma of skin (HCC) 09/13/2011    PCP: Stann Mainland. Sampson Goon, MD  REFERRING PROVIDER: Sheppard Plumber, NP  REFERRING DIAG: I89.0  THERAPY DIAG:  Lymphedema, not elsewhere classified [I89.0]  Rationale for Evaluation and Treatment: Rehabilitation  ONSET DATE: Onset of RLE lymphedema s/p XRT for R melanoma recurrence in 2012  SUBJECTIVE:                                                                                                                                                                                           SUBJECTIVE STATEMENT:Barbara Burgess presents to  OT for Intensive Phase CDT to cancer-related  BLE lymphedema, R>L. Pt reports R leg redness and increased swelling is worse today. OT messaged PCP in at  end of session in hopes someone at his office close by may be able to have a look and confirm/ rule out cellulitis.  PERTINENT HISTORY:   relevant to LE: Takes NORVASC, HTN, L knee OA, hysterectomy, 1976 Tubal ligation, former smoker; CVI dx 2024  Oncology History 09/05/2011 Exploratory laparotomy and debulking of right pelvic sidewall mass.~ 2. Bilateral salpingo-oophorectomy.~ 3. Right pelvic lymph node sampling.~~~; Path: Right pelvic sidewall mass and right fallopian tube and ovary, excision; - Metastatic malignant melanoma involving matted lymph nodes with extracapsular; extension, size: 9.0 x 7.5 x 4.5 cm; ; Braf neg;  Stage III or stage IV melanoma (we cannot tell if this is a locally advanced stage III cancer or stage IV) of the right lower leg with metastasis to the right pelvic lymph nodes and ovaries.  Fall 2012: Radiation therapy L groin and LLQ  PAIN:  Are you having pain? Yes: NPRS scale: not rated/10 Pain location: BLE, R>L Pain description: heavy, tight, full, neuropathic pain on top of R foot Aggravating factors: dependent sitting, standing, walking, hot weather Relieving factors: compression, elevation  PRECAUTIONS: Other: Lymphedema precautions; Hx melanoma  HAND DOMINANCE: right   PRIOR LEVEL OF FUNCTION: Independent  PATIENT GOALS: Learn how to best manage my lymphedema to keep it from getting worse   OBJECTIVE:  OBSERVATIONS / OTHER ASSESSMENTS:  Moderate, Stage  II, Cancer-related, Left Lower Extremity/ Left Lower Quadrant Lymphedema   Lymphedema Life Impact Scale (LLIS): 8.82% (The extent to which LE-related problems affected your life in the last week)   RLE COMPARATIVE LIMB VOLUMETRICS: 10th Visit 05/07/23  [facility] RIGHT    R LEG (  A-D) 3625.8  ml  R THIGH (E-G) 5896.1 ml  R FULL LIMB (A-G) 9522.0 ml  Limb Volume differential (LVD)  %  Volume change since last measured on 03/29/23 R leg (A-D) DECREASED by 0.5%. R thigh (E-G) DECREASED by 3.5%. RLE  full limb volume is DECREASED 2.4%.   Volume change since initial   (Blank rows = not tested)  BLE COMPARATIVE LIMB VOLUMETRICS: Initial 03/29/23  [facility] RIGHT    R LEG (A-D) 3644.0 ml  R THIGH (E-G) 6111.3 ml  R FULL LIMB (A-G) 9755.2 ml  Limb Volume differential (LVD)  %  Volume change since initial %  Volume change overall V  (Blank rows = not tested)  [facility] LEFT   L LEG (A-D) 4076.7 ml  L THIGH (E-G) 6365.5 ml  L FULL LIMB (A-G) 10443.15 ml  Limb Volume differential (LVD)  LEG LVD= 12.9%, L>R Thigh LVD =4.2 %. L>R Full LIMB LVD = 7.05%, L>R  Volume change since initial %  Volume change overall %  (Blank rows = not tested)     TODAY'S TREATMENT:   No RLE/RLQ  MLD 2/2 suspected R leg cellulitis No R leg compression wraps 2/2 suspected cellulitis  PATIENT EDUCATION:  Continued Pt/ CG edu for lymphedema self care home program throughout session. Topics include outcome of comparative limb volumetrics- starting limb volume differentials (LVDs), technology and gradient techniques used for short stretch, multilayer compression wrapping, simple self-MLD, therapeutic lymphatic pumping exercises, skin/nail care, LE precautions,. compression garment recommendations and specifications, wear and care schedule and compression garment donning / doffing w assistive devices. Discussed progress towards all OT goals since commencing CDT. All questions answered to the Pt's satisfaction. Good return.  FLEXITOUCH PRECAUTIONS: Pt education re precautions related to use of advanced vaso-pneumatic device. Pt verbalized understanding that she should never use device on 2 legs simultaneously and should not use device 2 x on same day to avoid overloading her heart with fluid volume return both at present, and in years to come should her medical condition change. Pt instructed to remove device immediately should she experience atypical SOP, light headedness, or acute pain. Pt instructed to discontinue  pump if she suspects, or has any infection, blood clot, cellulitis, the flu, corona virus, etc. Person educated: Patient Education method: Explanation, Demonstration, Verbal and Tactile cues, and Handouts Education comprehension: verbalized understanding, returned demonstration, verbal cues required, and needs further education  LYMPHEDEMA SELF-CARE HOME PROGRAM: all to be performed daily and ongoing as LE is a chronic, progressive condition with no cure RLE/RLQ Simple Self-Manual Lymphatic Drainage (MLD): In supine and side lying utilizing bilateral short neck sequence, deep abdominal pathways via diaphragmatic breathing, ipsilateral axillary inguinal  anastomosis, then inguinal LN, and dynamic J strokes from proximal to distal staring at groin and thigh, then knee, then leg segment, and finally  retrograde sweeps back to terminus. Skin care to limit infection risk and facilitate essential skin excursion or optimal lymphatic function Lymphatic Pumping there ex- 2 x daily, in sequence, 10 reps bilaterally. Hole 5 seconds each Multilayer compression wrapping: 1 each 8 and 10 cm wide short stretch bandage applied  using overlapping, gradient techniques starting at base of the and ending at popliteal fossa. Bandages applied over a single layer of 0.4 cm thick Rosidal foam and cotton stockinett underneath all. During Intensive Phase CDT patients undergo short stretch bandaging using gradient techniques one limb at a time, toes to groin During Self-management phase of CDT Pt's utilize appropriate compression garments and HOS  devices that meet their individual needs, abilities and lifestyle.   Custom-made gradient compression garments and HOS devices are medically necessary in this case because they are uniquely sized and shaped to fit the exact dimensions of the affected extremities with deformities, and to provide accurate and consistent gradient compression and containment, essential to optimally managing  this patient's symptoms of chronic, progressive lymphedema. Multiple custom compression garments are needed for optimal hygiene to limit infection risk. Custom compression garments should be replaced q 3-6 months When worn consistently for optimal lipo-lymphedema self-management over time.  ASSESSMENT: CLINICAL IMPRESSION:  Barbara Burgess presenting with slowly worsening redness, puffy swelling and warmth in R leg this morning. Pt has had slight and stable symptoms for several days, but today progression of infection symptoms are more obvious. Pt encouraged to see her PCP, referring MD, or even Urgent Care to confirm, or / rule out suspected cellulitis in order to limit progression and also avoid a possibly hours long wait at the  ED over this weekend. OT messaged PCP at end of session in hopes someone at his office very near to the lymphedema clinic close may be able to have a look at her leg after our visit. Pt verbalized understanding of lymphedema precautions. Cont as per POC.  NOTE: Medical necessity for Advanced , Sequential Pneumatic Flexitouch Device: Continued Pt edu re pneumatic compression devices throughout session. Barbara Burgess will benefit from a Flexitouch Plus advanced device to assist her with long term self-management of stage II, RLE/RLQ lymphedema, including abdomen, 2/2 treatment for endometrial cancer. Barbara Burgess has undergone skilled Occupational Therapy for conservative Complete Decongestive Therapy (CDT) 2 x weekly for greater than 4 weeks with significant signs/symptoms of chronic lymphedema persisting. CDT includes multi-layer compression with short stretch bandages during the Intensive Phase, appropriate compression garments once limb volume reduction plateaus in the self-management phase, therapeutic lymphatic pumping exercise, skin care daily to limit infection risk and progression, and daily manual lymphatic drainage (MLD) and limb elevation. Pt is 100% compliant with all self-management  home program components. She used a basic ([id number]) vaso pneumatic compression "pump" (model and serial number unknown)   for several weeks. This basic device provides squeeze and release compression  from the ankle to the proximal thigh only, and does not address this patient's groin and abdominal involvement  2/2 cancer Rx. The basic device failed to control chronic, progressive lymphedema in the right lower extremity and lower quadrant (abdomen and groin). Chronic swelling is unchanged, lymphedema-associated pain and discomfort persists, and high-protein tissue fibrosis is unchanged . Pt will benefit from the Flexitouch PLUS advanced [phone]) sequential pneumatic compression device to reduce limb volume and fibrosis, to reduce infection risk, and to optimally manage chronic, progressive lymphedema over time at home.   OBJECTIVE IMPAIRMENTS: decreased knowledge of condition, decreased knowledge of use of DME, chronic progressive BLE swelling, R>L , LE associated pain and skin changes,   ACTIVITY LIMITATIONS: gravity dependent sitting, standing and extended walking exacerbate limb swelling and associated pain limiting functional ambulation and mobility; limb swelling limits Pt's ability to fit street shoes and socks; BLE LE and pain limits social participation; LE limits body image, and  "gets me down" causing feelings of anger, depression or frustration.   PARTICIPATION LIMITATIONS: painting hobby, social participation in community activities requiring extended sitting, standing, walking  PERSONAL FACTORS: Time since onset of injury/illness/exacerbation and 1-2 comorbidities: (Ca hx w XRT, HTN)  are also affecting patient's functional outcome.   REHAB POTENTIAL: Good  GOALS: Goals reviewed with patient? Yes  SHORT TERM GOALS: Target date: 4th OT Rx visit  Pt will demonstrate understanding of lymphedema precautions and prevention strategies with modified independence using a printed reference to  identify at least 5 precautions and discussing how s/he may implement them into daily life to reduce risk of progression with extra time. Baseline:Max A Goal status: 05/07/23 PROGRESSING- additional time needed to complete  2.  Pt will be able to apply multilayer, thigh length, gradient, compression wraps to one leg at a time with modified assistance (extra time) to decrease limb volume, to limit infection risk, and to limit lymphedema progression.  Baseline: Max A Goal status: 05/07/23 GOAL MET   LONG TERM GOALS: Target date: 06/20/23  1.  Given this patient's Intake score of 8.82 % on the Lymphedema Life Impact Scale (LLIS), patient will experience a reduction of at least 5 points in her perceived level of functional impairment resulting from lymphedema to improve functional performance and quality of life (QOL). Baseline: 8.82% Goal status: 05/07/23 ONGOING  3.   During Intensive phase CDT Pt will achieve at least 85% compliance with all lymphedema self-care home program components, including daily skin care, compression wraps and /or garments, simple self MLD and lymphatic pumping therex to habituate LE self care protocol  into ADLs for optimal LE self-management over time. Baseline: Max A Goal status: 05/07/23 GOAL MET  4.  Pt will achieve at least a 10% volume reduction in the LLE to return limb to typical size and shape, to limit infection risk and LE progression, to decrease pain, to improve function. Baseline: Dependent Goal status: 05/07/23 PROGRESSING  5.  Pt will obtain proper, recommended compression garments/devices and be modified independent with donning/doffing to optimize limb volume reduction and limit LE  progression over time. Baseline:  Goal status: 05/07/23 ONGOING  PLAN:  PT FREQUENCY: 2x/week and PRN  PT DURATION: 12 weeks and PRN  PLANNED INTERVENTIONS:  Complete Decongestive Therapy: Therapeutic lymphatic pumping exercises, Manual lymph drainage (MLD),  Multilayer, gradient Compression bandaging, Manual therapy, skin care to limit infection risk, scar massage; During self management phase Pt will be fit with appropriate compression garments and devices,  Therapeutic activities Patient/Family education for LE Self Care home program  PLAN FOR NEXT SESSION:  LLE knee length multilayer compression wraps Continue LE self care home program edu- wrapping and simple self-mld MLD to RLE/RLQ  Loel Dubonnet, Barbara, OTR/L, CLT-LANA 05/18/23 11:31 AM

## 2023-05-21 ENCOUNTER — Ambulatory Visit: Payer: Medicare PPO | Admitting: Occupational Therapy

## 2023-05-21 ENCOUNTER — Encounter: Payer: Self-pay | Admitting: Occupational Therapy

## 2023-05-21 DIAGNOSIS — I890 Lymphedema, not elsewhere classified: Secondary | ICD-10-CM

## 2023-05-21 NOTE — Therapy (Signed)
OUTPATIENT OCCUPATIONAL THERAPY TREATMENT NOTE  LOWER EXTREMITY LYMPHEDEMA  Patient Name: Barbara Burgess MRN: [id number] DOB:21-Nov-1946, 77 y.o., female Today's Date: 05/21/2023  END OF SESSION:   OT End of Session - 05/21/23 1010     Visit Number 15    Number of Visits 36    Date for OT Re-Evaluation 06/19/23    OT Start Time 1000    OT Stop Time 1115    OT Time Calculation (min) 75 min    Activity Tolerance Patient tolerated treatment well;No increased pain    Behavior During Therapy WFL for tasks assessed/performed               Past Medical History:  Diagnosis Date   Arthritis    knee and hip,  per [facility] Patient Packet.    Bilateral bunions    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HCC)     per [facility] New Patient Packet.    Colitis    H/O CT scan of chest 11/27/2018   By Roland Rack, per [facility] New Patient Packet.    H/O mammogram 11/27/2018   By Pearlie Oyster at [facility], per [facility] New Patient Packet.    History of CT scan of abdomen 11/27/2018   By Roland Rack, per [facility] New Patient Packet.    History of upper extremity x-ray 11/27/2018   By Lonny Prude, of Shoulder, per [facility] New Patient Packet.    History of x-ray of hip 11/27/2018   By Lonny Prude, of Hip, per [facility] New Patient Packet.    Hyperlipemia     per [facility] New Patient Packet.    Hypertension     per [facility] New Patient Packet.    Impingement syndrome of shoulder     per [facility] New Patient Packet.    Incontinence    Mild Nighttime per [facility] New Patient Packet.   Insomnia    Melanoma (HCC)    right lower leg; right ovary   Osteoarthritis of multiple joints    Rosacea     per [facility] New Patient Packet.    Sleep disorder    Past Surgical History:  Procedure Laterality Date   ABDOMINAL HYSTERECTOMY  11/28/1987   Partial per [facility] New Patient Packet   ARTHROSCOPIC REPAIR ACL Left 11/28/2003   Left Knee/ Torn Medial Meniscus per [facility] New Patient Packet   BREAST BIOPSY     CATARACT EXTRACTION W/ INTRAOCULAR  LENS IMPLANT Bilateral    COLONOSCOPY  11/27/2016   By Liberty Handy,  per [facility] New Patient Packet.    COLONOSCOPY  11/27/2017   By Liberty Handy, per [facility] New Patient Packet.    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   Procedure: TOTAL KNEE ARTHROPLASTY;  Surgeon: Juanell Fairly, MD;  Location: [facility] ORS;  Service: Orthopedics;  Laterality: Left;   TUBAL LIGATION  1976   Patient Active Problem List   Diagnosis Date Noted   Chronic venous insufficiency 03/25/2023   Lymphedema 12/24/2022   S/P TKR (total knee replacement) using cement, left 12/20/2021   Sesamoiditis 05/06/2020   Hav (hallux abducto valgus), unspecified laterality 01/08/2020   Porokeratosis 01/08/2020   Insomnia  Rosacea    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HCC)    History of melanoma    Hypertension    Hyperlipemia    Osteoarthritis of multiple joints    Arthritis of knee, left 07/14/2016   Foot pain 03/17/2013   Melanoma of skin (HCC) 09/13/2011    PCP: Stann Mainland. Sampson Goon, MD  REFERRING PROVIDER: Sheppard Plumber, NP  REFERRING DIAG: I89.0  THERAPY DIAG:  Lymphedema, not elsewhere classified [I89.0]  Rationale for Evaluation and Treatment: Rehabilitation  ONSET DATE: Onset of RLE lymphedema s/p XRT for R melanoma recurrence in 2012  SUBJECTIVE:                                                                                                                                                                                           SUBJECTIVE STATEMENT:Susan Delsanto presents to  OT for Intensive Phase CDT to cancer-related  BLE lymphedema, R>L. Pt reports she was indeed able to see her PCOP after our last session when we were  concerned for cellulitis in the RLE. Pt tells me she started taking prescribed Keflex on Friday, 6/21, "but I don't see a lot of difference yet". LE-related pain in the R lower extremity is unchanged this morning. Pt denies pain associated with Cellulitis.  PERTINENT HISTORY:   relevant to LE: Takes NORVASC, HTN, L knee OA, hysterectomy, 1976 Tubal ligation, former smoker; CVI dx 2024  Oncology History 09/05/2011 Exploratory laparotomy and debulking of right pelvic sidewall mass.~ 2. Bilateral salpingo-oophorectomy.~ 3. Right pelvic lymph node sampling.~~~; Path: Right pelvic sidewall mass and right fallopian tube and ovary, excision; - Metastatic malignant melanoma involving matted lymph nodes with extracapsular; extension, size: 9.0 x 7.5 x 4.5 cm; ; Braf neg;  Stage III or stage IV melanoma (we cannot tell if this is a locally advanced stage III cancer or stage IV) of the right lower leg with metastasis to the right pelvic lymph nodes and ovaries.  Fall 2012: Radiation therapy L groin and LLQ  PAIN:  Are you having pain? Yes: NPRS scale: not rated/10 Pain location: BLE, R>L Pain description: heavy, tight, full, neuropathic pain on top of R foot Aggravating factors: dependent sitting, standing, walking, hot weather Relieving factors: compression, elevation  PRECAUTIONS: Other: Lymphedema precautions; Hx melanoma  HAND DOMINANCE: right   PRIOR LEVEL OF FUNCTION: Independent  PATIENT GOALS: Learn how to best manage my lymphedema to keep it from getting worse   OBJECTIVE:  OBSERVATIONS / OTHER ASSESSMENTS:  Moderate, Stage  II, Cancer-related, Left Lower Extremity/ Left Lower Quadrant Lymphedema   Lymphedema Life Impact Scale (LLIS): 8.82% (The extent to which LE-related problems affected your life  in the last week)   RLE COMPARATIVE LIMB VOLUMETRICS: 10 th Visit 05/07/23  [facility] RIGHT    R LEG (A-D) 3625.8  ml  R THIGH (E-G) 5896.1 ml  R FULL LIMB (A-G) 9522.0 ml  Limb  Volume differential (LVD)  %  Volume change since last measured on 03/29/23 R leg (A-D) DECREASED by 0.5%. R thigh (E-G) DECREASED by 3.5%. RLE full limb volume is DECREASED 2.4%.   Volume change since initial   (Blank rows = not tested)  BLE COMPARATIVE LIMB VOLUMETRICS: Initial 03/29/23  [facility] RIGHT    R LEG (A-D) 3644.0 ml  R THIGH (E-G) 6111.3 ml  R FULL LIMB (A-G) 9755.2 ml  Limb Volume differential (LVD)  %  Volume change since initial %  Volume change overall V  (Blank rows = not tested)  [facility] LEFT   L LEG (A-D) 4076.7 ml  L THIGH (E-G) 6365.5 ml  L FULL LIMB (A-G) 10443.15 ml  Limb Volume differential (LVD)  LEG LVD= 12.9%, L>R Thigh LVD =4.2 %. L>R Full LIMB LVD = 7.05%, L>R  Volume change since initial %  Volume change overall %  (Blank rows = not tested)     TODAY'S TREATMENT:   No RLE/RLQ  MLD 2/2 suspected R leg cellulitis No R leg compression wraps 2/2 suspected cellulitis  PATIENT EDUCATION:  Continued Pt/ CG edu for lymphedema self care home program throughout session. Topics include outcome of comparative limb volumetrics- starting limb volume differentials (LVDs), technology and gradient techniques used for short stretch, multilayer compression wrapping, simple self-MLD, therapeutic lymphatic pumping exercises, skin/nail care, LE precautions,. compression garment recommendations and specifications, wear and care schedule and compression garment donning / doffing w assistive devices. Discussed progress towards all OT goals since commencing CDT. All questions answered to the Pt's satisfaction. Good return.  FLEXITOUCH PRECAUTIONS: Pt education re precautions related to use of advanced vaso-pneumatic device. Pt verbalized understanding that she should never use device on 2 legs simultaneously and should not use device 2 x on same day to avoid overloading her heart with fluid volume return both at present, and in years to come should her medical condition  change. Pt instructed to remove device immediately should she experience atypical SOP, light headedness, or acute pain. Pt instructed to discontinue pump if she suspects, or has any infection, blood clot, cellulitis, the flu, corona virus, etc. Person educated: Patient Education method: Explanation, Demonstration, Verbal and Tactile cues, and Handouts Education comprehension: verbalized understanding, returned demonstration, verbal cues required, and needs further education  LYMPHEDEMA SELF-CARE HOME PROGRAM: all to be performed daily and ongoing as LE is a chronic, progressive condition with no cure RLE/RLQ Simple Self-Manual Lymphatic Drainage (MLD): In supine and side lying utilizing bilateral short neck sequence, deep abdominal pathways via diaphragmatic breathing, ipsilateral axillary inguinal  anastomosis, then inguinal LN, and dynamic J strokes from proximal to distal staring at groin and thigh, then knee, then leg segment, and finally  retrograde sweeps back to terminus. Skin care to limit infection risk and facilitate essential skin excursion or optimal lymphatic function Lymphatic Pumping there ex- 2 x daily, in sequence, 10 reps bilaterally. Hole 5 seconds each Multilayer compression wrapping: 1 each 8 and 10 cm wide short stretch bandage applied  using overlapping, gradient techniques starting at base of the and ending at popliteal fossa. Bandages applied over a single layer of 0.4 cm thick Rosidal foam and cotton stockinett underneath all. During Intensive Phase CDT patients undergo short stretch bandaging using  gradient techniques one limb at a time, toes to groin During Self-management phase of CDT Pt's utilize appropriate compression garments and HOS devices that meet their individual needs, abilities and lifestyle.   Custom-made gradient compression garments and HOS devices are medically necessary in this case because they are uniquely sized and shaped to fit the exact dimensions of the  affected extremities with deformities, and to provide accurate and consistent gradient compression and containment, essential to optimally managing this patient's symptoms of chronic, progressive lymphedema. Multiple custom compression garments are needed for optimal hygiene to limit infection risk. Custom compression garments should be replaced q 3-6 months When worn consistently for optimal lipo-lymphedema self-management over time.  ASSESSMENT: CLINICAL IMPRESSION: Reviewed Flexitouch device precautions with Pt, including discontinuing use with signs/ symptoms of limb cellulitis, or any systemic infection. Pt confirms that she has been on oral Keflex for > 72 hours. We resumed RLE CDT today based on doctor's treatment not to resume LE care. R leg continues to present with mild redness and warmth and a distinct superior border circumferentially. Pebbled texture has resolved. No skin breaks, cuticle tear, insect bite, infected hair, or other obvious point of entry for infection is observed. MLD utilizing alternative axillary pathway was well tolerated without pain, and Pt requested full length thigh high compression wraps after manual therapy denying pain. Pt decided to postpone Flexitouch trial until infection is resolved. Cont as per POC.  NOTE: Medical necessity for Advanced , Sequential Pneumatic Flexitouch Device: Continued Pt edu re pneumatic compression devices throughout session. Ms Wisecup will benefit from a Flexitouch Plus advanced device to assist her with long term self-management of stage II, RLE/RLQ lymphedema, including abdomen, 2/2 treatment for endometrial cancer. Ms. Steinke has undergone skilled Occupational Therapy for conservative Complete Decongestive Therapy (CDT) 2 x weekly for greater than 4 weeks with significant signs/symptoms of chronic lymphedema persisting. CDT includes multi-layer compression with short stretch bandages during the Intensive Phase, appropriate compression garments  once limb volume reduction plateaus in the self-management phase, therapeutic lymphatic pumping exercise, skin care daily to limit infection risk and progression, and daily manual lymphatic drainage (MLD) and limb elevation. Pt is 100% compliant with all self-management home program components. She used a basic ([id number]) vaso pneumatic compression "pump" (model and serial number unknown)   for several weeks. This basic device provides squeeze and release compression  from the ankle to the proximal thigh only, and does not address this patient's groin and abdominal involvement  2/2 cancer Rx. The basic device failed to control chronic, progressive lymphedema in the right lower extremity and lower quadrant (abdomen and groin). Chronic swelling is unchanged, lymphedema-associated pain and discomfort persists, and high-protein tissue fibrosis is unchanged . Pt will benefit from the Flexitouch PLUS advanced [id number]) sequential pneumatic compression device to reduce limb volume and fibrosis, to reduce infection risk, and to optimally manage chronic, progressive lymphedema over time at home.   OBJECTIVE IMPAIRMENTS: decreased knowledge of condition, decreased knowledge of use of DME, chronic progressive BLE swelling, R>L , LE associated pain and skin changes,   ACTIVITY LIMITATIONS: gravity dependent sitting, standing and extended walking exacerbate limb swelling and associated pain limiting functional ambulation and mobility; limb swelling limits Pt's ability to fit street shoes and socks; BLE LE and pain limits social participation; LE limits body image, and  "gets me down" causing feelings of anger, depression or frustration.   PARTICIPATION LIMITATIONS: painting hobby, social participation in community activities requiring extended sitting, standing, walking  PERSONAL FACTORS: Time  since onset of injury/illness/exacerbation and 1-2 comorbidities: (Ca hx w XRT, HTN)  are also affecting patient's functional outcome.    REHAB POTENTIAL: Good    GOALS: Goals reviewed with patient? Yes  SHORT TERM GOALS: Target date: 4th OT Rx visit  Pt will demonstrate understanding of lymphedema precautions and prevention strategies with modified independence using a printed reference to identify at least 5 precautions and discussing how s/he may implement them into daily life to reduce risk of progression with extra time. Baseline:Max A Goal status: 05/07/23 PROGRESSING- additional time needed to complete  2.  Pt will be able to apply multilayer, thigh length, gradient, compression wraps to one leg at a time with modified assistance (extra time) to decrease limb volume, to limit infection risk, and to limit lymphedema progression.  Baseline: Max A Goal status: 05/07/23 GOAL MET   LONG TERM GOALS: Target date: 06/20/23  1.  Given this patient's Intake score of 8.82 % on the Lymphedema Life Impact Scale (LLIS), patient will experience a reduction of at least 5 points in her perceived level of functional impairment resulting from lymphedema to improve functional performance and quality of life (QOL). Baseline: 8.82% Goal status: 05/07/23 ONGOING  3.   During Intensive phase CDT Pt will achieve at least 85% compliance with all lymphedema self-care home program components, including daily skin care, compression wraps and /or garments, simple self MLD and lymphatic pumping therex to habituate LE self care protocol  into ADLs for optimal LE self-management over time. Baseline: Max A Goal status: 05/07/23 GOAL MET  4.  Pt will achieve at least a 10% volume reduction in the LLE to return limb to typical size and shape, to limit infection risk and LE progression, to decrease pain, to improve function. Baseline: Dependent Goal status: 05/07/23 PROGRESSING  5.  Pt will obtain proper, recommended compression garments/devices and be modified independent with donning/doffing to optimize limb volume reduction and limit LE   progression over time. Baseline:  Goal status: 05/07/23 ONGOING  PLAN:  PT FREQUENCY: 2x/week and PRN  PT DURATION: 12 weeks and PRN  PLANNED INTERVENTIONS:  Complete Decongestive Therapy: Therapeutic lymphatic pumping exercises, Manual lymph drainage (MLD), Multilayer, gradient Compression bandaging, Manual therapy, skin care to limit infection risk, scar massage; During self management phase Pt will be fit with appropriate compression garments and devices,  Therapeutic activities Patient/Family education for LE Self Care home program  PLAN FOR NEXT SESSION:  LLE knee length multilayer compression wraps Continue LE self care home program edu- wrapping and simple self-mld MLD to RLE/RLQ  Loel Dubonnet, MS, OTR/L, CLT-LANA 05/21/23 12:18 PM

## 2023-05-23 ENCOUNTER — Encounter: Payer: Self-pay | Admitting: Occupational Therapy

## 2023-05-23 ENCOUNTER — Ambulatory Visit: Payer: Medicare PPO | Admitting: Occupational Therapy

## 2023-05-23 DIAGNOSIS — I890 Lymphedema, not elsewhere classified: Secondary | ICD-10-CM

## 2023-05-23 NOTE — Therapy (Signed)
OUTPATIENT OCCUPATIONAL THERAPY TREATMENT NOTE  LOWER EXTREMITY LYMPHEDEMA  Patient Name: Barbara Burgess MRN: [id number] DOB:[birth year], 77 y.o., female Today's Date: 05/23/2023  END OF SESSION:   OT End of Session - 05/23/23 1214     Visit Number 16    Number of Visits 36    Date for OT Re-Evaluation 06/19/23    OT Start Time 1000    OT Stop Time 1115    OT Time Calculation (min) 75 min    Activity Tolerance Patient tolerated treatment well;No increased pain    Behavior During Therapy WFL for tasks assessed/performed               Past Medical History:  Diagnosis Date   Arthritis    knee and hip,  per [facility] Patient [facility].    Bilateral bunions    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HCC)     per [facility] New Patient Packet.    Colitis    H/O CT scan of chest 11/27/2018   By Roland Rack, per [facility] New Patient Packet.    H/O mammogram 11/27/2018   By Pearlie Oyster at [facility], per [facility] New Patient Packet.    History of CT scan of abdomen 11/27/2018   By Roland Rack, per [facility] Patient Packet.    History of upper extremity x-ray 11/27/2018   By Lonny Prude, of Shoulder, per [facility] New Patient Packet.    History of x-ray of hip 11/27/2018   By Lonny Prude, of Hip, per [facility] New Patient Packet.    Hyperlipemia     per [facility] Patient Packet.    Hypertension     per [facility] Patient Packet.    Impingement syndrome of shoulder     per [facility] New Patient Packet.    Incontinence    Mild Nighttime per [facility] New Patient Packet.   Insomnia    Melanoma (HCC)    right lower leg; right ovary   Osteoarthritis of multiple joints    Rosacea     per [facility] New Patient Packet.    Sleep disorder    Past Surgical History:  Procedure Laterality Date   ABDOMINAL HYSTERECTOMY  11/28/1987   Partial per [facility] Patient Packet   ARTHROSCOPIC REPAIR ACL Left 11/28/2003   Left Knee/ Torn Medial Meniscus per [facility] New Patient Packet   BREAST BIOPSY     CATARACT EXTRACTION W/ INTRAOCULAR  LENS IMPLANT Bilateral    COLONOSCOPY  11/27/2016   By Liberty Handy,  per [facility] Patient Packet.    COLONOSCOPY  11/27/2017   By Liberty Handy, per [facility] New Patient Packet.    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   Procedure: TOTAL KNEE ARTHROPLASTY;  Surgeon: Juanell Fairly, MD;  Location: [facility] ORS;  Service: Orthopedics;  Laterality: Left;   TUBAL LIGATION  1976   Patient Active Problem List   Diagnosis Date Noted   Chronic venous insufficiency 03/25/2023   Lymphedema 12/24/2022   S/P TKR (total knee replacement) using cement, left 12/20/2021   Sesamoiditis 05/06/2020   Hav (hallux abducto valgus), unspecified laterality 01/08/2020   Porokeratosis 01/08/2020   Insomnia  Rosacea    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HCC)    History of melanoma    Hypertension    Hyperlipemia    Osteoarthritis of multiple joints    Arthritis of knee, left 07/14/2016   Foot pain 03/17/2013   Melanoma of skin (HCC) 09/13/2011    PCP: Stann Mainland. Sampson Goon, MD  REFERRING PROVIDER: Sheppard Plumber, NP  REFERRING DIAG: I89.0  THERAPY DIAG:  Lymphedema, not elsewhere classified [I89.0]  Rationale for Evaluation and Treatment: Rehabilitation  ONSET DATE: Onset of RLE lymphedema s/p XRT for R melanoma recurrence in 2012  SUBJECTIVE:                                                                                                                                                                                           SUBJECTIVE STATEMENT:Barbara Burgess presents to  OT for Intensive Phase CDT to cancer-related  BLE lymphedema, R>L. Pt started taking prescribed Keflex on Friday, 6/21, but signs/symptoms of infection  persist. Redness and swelling are no worse either, but obvious upper border is visible circumferentially below the knee. Pt denies LE related pain. She also denies infection related pain.   PERTINENT HISTORY:   relevant to LE: Takes NORVASC, HTN, L knee OA, hysterectomy, 1976 Tubal ligation, former smoker; CVI dx 2024  Oncology History 09/05/2011 Exploratory laparotomy and debulking of right pelvic sidewall mass.~ 2. Bilateral salpingo-oophorectomy.~ 3. Right pelvic lymph node sampling.~~~; Path: Right pelvic sidewall mass and right fallopian tube and ovary, excision; - Metastatic malignant melanoma involving matted lymph nodes with extracapsular; extension, size: 9.0 x 7.5 x 4.5 cm; ; Braf neg;  Stage III or stage IV melanoma (we cannot tell if this is a locally advanced stage III cancer or stage IV) of the right lower leg with metastasis to the right pelvic lymph nodes and ovaries.  Fall 2012: Radiation therapy L groin and LLQ  PAIN:  Are you having pain? Yes: NPRS scale: not rated/10 Pain location: BLE, R>L Pain description: heavy, tight, full, neuropathic pain on top of R foot Aggravating factors: dependent sitting, standing, walking, hot weather Relieving factors: compression, elevation  PRECAUTIONS: Other: Lymphedema precautions; Hx melanoma  HAND DOMINANCE: right   PRIOR LEVEL OF FUNCTION: Independent  PATIENT GOALS: Learn how to best manage my lymphedema to keep it from getting worse   OBJECTIVE:  OBSERVATIONS / OTHER ASSESSMENTS:  Moderate, Stage  II, Cancer-related, Left Lower Extremity/ Left Lower Quadrant Lymphedema   Lymphedema Life Impact Scale (LLIS): 8.82% (The extent to which LE-related problems affected your life in the last week)   RLE COMPARATIVE LIMB VOLUMETRICS: 10 th Visit 05/07/23  [facility] RIGHT  R LEG (A-D) 3625.8  ml  R THIGH (E-G) 5896.1 ml  R FULL LIMB (A-G) 9522.0 ml  Limb Volume differential (LVD)  %  Volume change since last measured on  03/29/23 R leg (A-D) DECREASED by 0.5%. R thigh (E-G) DECREASED by 3.5%. RLE full limb volume is DECREASED 2.4%.   Volume change since initial   (Blank rows = not tested)  BLE COMPARATIVE LIMB VOLUMETRICS: Initial 03/29/23  [facility] RIGHT    R LEG (A-D) 3644.0 ml  R THIGH (E-G) 6111.3 ml  R FULL LIMB (A-G) 9755.2 ml  Limb Volume differential (LVD)  %  Volume change since initial %  Volume change overall V  (Blank rows = not tested)  [facility] LEFT   L LEG (A-D) 4076.7 ml  L THIGH (E-G) 6365.5 ml  L FULL LIMB (A-G) 10443.15 ml  Limb Volume differential (LVD)  LEG LVD= 12.9%, L>R Thigh LVD =4.2 %. L>R Full LIMB LVD = 7.05%, L>R  Volume change since initial %  Volume change overall %  (Blank rows = not tested)     TODAY'S TREATMENT:   Pt been on Keflex for  5-6 days, so we continued w RLE RLQ MLD, as established. RLE thigh length multilayer gradient compression wraps   PATIENT EDUCATION:  Continued Pt/ CG edu for lymphedema self care home program throughout session. Topics include outcome of comparative limb volumetrics- starting limb volume differentials (LVDs), technology and gradient techniques used for short stretch, multilayer compression wrapping, simple self-MLD, therapeutic lymphatic pumping exercises, skin/nail care, LE precautions,. compression garment recommendations and specifications, wear and care schedule and compression garment donning / doffing w assistive devices. Discussed progress towards all OT goals since commencing CDT. All questions answered to the Pt's satisfaction. Good return.  FLEXITOUCH PRECAUTIONS: Pt education re precautions related to use of advanced vaso-pneumatic device. Pt verbalized understanding that she should never use device on 2 legs simultaneously and should not use device 2 x on same day to avoid overloading her heart with fluid volume return both at present, and in years to come should her medical condition change. Pt instructed to remove  device immediately should she experience atypical SOP, light headedness, or acute pain. Pt instructed to discontinue pump if she suspects, or has any infection, blood clot, cellulitis, the flu, corona virus, etc. Person educated: Patient Education method: Explanation, Demonstration, Verbal and Tactile cues, and Handouts Education comprehension: verbalized understanding, returned demonstration, verbal cues required, and needs further education  LYMPHEDEMA SELF-CARE HOME PROGRAM: all to be performed daily and ongoing as LE is a chronic, progressive condition with no cure RLE/RLQ Simple Self-Manual Lymphatic Drainage (MLD): In supine and side lying utilizing bilateral short neck sequence, deep abdominal pathways via diaphragmatic breathing, ipsilateral axillary inguinal  anastomosis, then inguinal LN, and dynamic J strokes from proximal to distal staring at groin and thigh, then knee, then leg segment, and finally  retrograde sweeps back to terminus. Skin care to limit infection risk and facilitate essential skin excursion or optimal lymphatic function Lymphatic Pumping there ex- 2 x daily, in sequence, 10 reps bilaterally. Hole 5 seconds each Multilayer compression wrapping: 1 each 8 and 10 cm wide short stretch bandage applied  using overlapping, gradient techniques starting at base of the and ending at popliteal fossa. Bandages applied over a single layer of 0.4 cm thick Rosidal foam and cotton stockinett underneath all. During Intensive Phase CDT patients undergo short stretch bandaging using gradient techniques one limb at a time, toes to groin During Self-management  phase of CDT Pt's utilize appropriate compression garments and HOS devices that meet their individual needs, abilities and lifestyle.   Custom-made gradient compression garments and HOS devices are medically necessary in this case because they are uniquely sized and shaped to fit the exact dimensions of the affected extremities with  deformities, and to provide accurate and consistent gradient compression and containment, essential to optimally managing this patient's symptoms of chronic, progressive lymphedema. Multiple custom compression garments are needed for optimal hygiene to limit infection risk. Custom compression garments should be replaced q 3-6 months When worn consistently for optimal lipo-lymphedema self-management over time.  ASSESSMENT: CLINICAL IMPRESSION:  R leg continues to present with mild redness and warmth and a distinct superior border circumferentially. Pebbled texture has resolved. No skin breakdown or irritation is observed. Continued MLD utilizing alternative axillary pathway was well tolerated without pain, and Pt requested full length thigh high compression wraps after manual therapy denying pain. Pt decided to postpone Flexitouch trial until infection is resolved. Continue to monitor skin condition carefully. Cont as per POC.  NOTE: Medical necessity for Advanced , Sequential Pneumatic Flexitouch Device: Continued Pt edu re pneumatic compression devices throughout session. Ms Theriault will benefit from a Flexitouch Plus advanced device to assist her with long term self-management of stage II, RLE/RLQ lymphedema, including abdomen, 2/2 treatment for endometrial cancer. Ms. Shrestha has undergone skilled Occupational Therapy for conservative Complete Decongestive Therapy (CDT) 2 x weekly for greater than 4 weeks with significant signs/symptoms of chronic lymphedema persisting. CDT includes multi-layer compression with short stretch bandages during the Intensive Phase, appropriate compression garments once limb volume reduction plateaus in the self-management phase, therapeutic lymphatic pumping exercise, skin care daily to limit infection risk and progression, and daily manual lymphatic drainage (MLD) and limb elevation. Pt is 100% compliant with all self-management home program components. She used a basic ([id number])  vaso pneumatic compression "pump" (model and serial number unknown)   for several weeks. This basic device provides squeeze and release compression  from the ankle to the proximal thigh only, and does not address this patient's groin and abdominal involvement  2/2 cancer Rx. The basic device failed to control chronic, progressive lymphedema in the right lower extremity and lower quadrant (abdomen and groin). Chronic swelling is unchanged, lymphedema-associated pain and discomfort persists, and high-protein tissue fibrosis is unchanged . Pt will benefit from the Flexitouch PLUS advanced [phone]) sequential pneumatic compression device to reduce limb volume and fibrosis, to reduce infection risk, and to optimally manage chronic, progressive lymphedema over time at home.   OBJECTIVE IMPAIRMENTS: decreased knowledge of condition, decreased knowledge of use of DME, chronic progressive BLE swelling, R>L , LE associated pain and skin changes,   ACTIVITY LIMITATIONS: gravity dependent sitting, standing and extended walking exacerbate limb swelling and associated pain limiting functional ambulation and mobility; limb swelling limits Pt's ability to fit street shoes and socks; BLE LE and pain limits social participation; LE limits body image, and  "gets me down" causing feelings of anger, depression or frustration.   PARTICIPATION LIMITATIONS: painting hobby, social participation in community activities requiring extended sitting, standing, walking  PERSONAL FACTORS: Time since onset of injury/illness/exacerbation and 1-2 comorbidities: (Ca hx w XRT, HTN)  are also affecting patient's functional outcome.   REHAB POTENTIAL: Good    GOALS: Goals reviewed with patient? Yes  SHORT TERM GOALS: Target date: 4th OT Rx visit  Pt will demonstrate understanding of lymphedema precautions and prevention strategies with modified independence using a printed reference to  identify at least 5 precautions and discussing how  s/he may implement them into daily life to reduce risk of progression with extra time. Baseline:Max A Goal status: 05/07/23 PROGRESSING- additional time needed to complete  2.  Pt will be able to apply multilayer, thigh length, gradient, compression wraps to one leg at a time with modified assistance (extra time) to decrease limb volume, to limit infection risk, and to limit lymphedema progression.  Baseline: Max A Goal status: 05/07/23 GOAL MET   LONG TERM GOALS: Target date: 06/20/23  1.  Given this patient's Intake score of 8.82 % on the Lymphedema Life Impact Scale (LLIS), patient will experience a reduction of at least 5 points in her perceived level of functional impairment resulting from lymphedema to improve functional performance and quality of life (QOL). Baseline: 8.82% Goal status: 05/07/23 ONGOING  3.   During Intensive phase CDT Pt will achieve at least 85% compliance with all lymphedema self-care home program components, including daily skin care, compression wraps and /or garments, simple self MLD and lymphatic pumping therex to habituate LE self care protocol  into ADLs for optimal LE self-management over time. Baseline: Max A Goal status: 05/07/23 GOAL MET  4.  Pt will achieve at least a 10% volume reduction in the LLE to return limb to typical size and shape, to limit infection risk and LE progression, to decrease pain, to improve function. Baseline: Dependent Goal status: 05/07/23 PROGRESSING  5.  Pt will obtain proper, recommended compression garments/devices and be modified independent with donning/doffing to optimize limb volume reduction and limit LE  progression over time. Baseline:  Goal status: 05/07/23 ONGOING  PLAN:  PT FREQUENCY: 2x/week and PRN  PT DURATION: 12 weeks and PRN  PLANNED INTERVENTIONS:  Complete Decongestive Therapy: Therapeutic lymphatic pumping exercises, Manual lymph drainage (MLD), Multilayer, gradient Compression bandaging, Manual therapy,  skin care to limit infection risk, scar massage; During self management phase Pt will be fit with appropriate compression garments and devices,  Therapeutic activities Patient/Family education for LE Self Care home program  PLAN FOR NEXT SESSION:  LLE knee length multilayer compression wraps Continue LE self care home program edu- wrapping and simple self-mld MLD to RLE/RLQ  Loel Dubonnet, MS, OTR/L, CLT-LANA 05/23/23 12:22 PM

## 2023-05-28 ENCOUNTER — Ambulatory Visit: Payer: Medicare PPO | Attending: Nurse Practitioner | Admitting: Occupational Therapy

## 2023-05-28 DIAGNOSIS — I890 Lymphedema, not elsewhere classified: Secondary | ICD-10-CM

## 2023-05-28 NOTE — Therapy (Signed)
OUTPATIENT OCCUPATIONAL THERAPY TREATMENT NOTE  LOWER EXTREMITY LYMPHEDEMA  Patient Name: Barbara Burgess MRN: [id number] DOB:1946-09-03, 77 y.o., female Today's Date: 05/28/2023  END OF SESSION:   OT End of Session - 05/28/23 1015     Visit Number 17    Number of Visits 36    Date for OT Re-Evaluation 06/19/23    OT Start Time 1004    OT Stop Time 1130    OT Time Calculation (min) 86 min    Activity Tolerance Patient tolerated treatment well;No increased pain    Behavior During Therapy WFL for tasks assessed/performed               Past Medical History:  Diagnosis Date   Arthritis    knee and hip,  per [facility] Patient [facility].    Bilateral bunions    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HCC)     per [facility] Patient Packet.    Colitis    H/O CT scan of chest 11/27/2018   By Roland Rack, per [facility] Patient Packet.    H/O mammogram 11/27/2018   By Pearlie Oyster at [facility], per [facility] New Patient Packet.    History of CT scan of abdomen 11/27/2018   By Roland Rack, per [facility] Patient Packet.    History of upper extremity x-ray 11/27/2018   By Lonny Prude, of Shoulder, per [facility] New Patient Packet.    History of x-ray of hip 11/27/2018   By Lonny Prude, of Hip, per [facility] Patient Packet.    Hyperlipemia     per [facility] Patient Packet.    Hypertension     per [facility] Patient Packet.    Impingement syndrome of shoulder     per [facility] New Patient Packet.    Incontinence    Mild Nighttime per [facility] New Patient Packet.   Insomnia    Melanoma (HCC)    right lower leg; right ovary   Osteoarthritis of multiple joints    Rosacea     per [facility] New Patient Packet.    Sleep disorder    Past Surgical History:  Procedure Laterality Date   ABDOMINAL HYSTERECTOMY  11/28/1987   Partial per [facility] Patient Packet   ARTHROSCOPIC REPAIR ACL Left 11/28/2003   Left Knee/ Torn Medial Meniscus per [facility] New Patient Packet   BREAST BIOPSY     CATARACT EXTRACTION W/ INTRAOCULAR  LENS IMPLANT Bilateral    COLONOSCOPY  11/27/2016   By Liberty Handy,  per [facility] Patient Packet.    COLONOSCOPY  11/27/2017   By Liberty Handy, per [facility] Patient Packet.    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   Procedure: TOTAL KNEE ARTHROPLASTY;  Surgeon: Juanell Fairly, MD;  Location: [facility] ORS;  Service: Orthopedics;  Laterality: Left;   TUBAL LIGATION  1976   Patient Active Problem List   Diagnosis Date Noted   Chronic venous insufficiency 03/25/2023   Lymphedema 12/24/2022   S/P TKR (total knee replacement) using cement, left 12/20/2021   Sesamoiditis 05/06/2020   Hav (hallux abducto valgus), unspecified laterality 01/08/2020   Porokeratosis 01/08/2020   Insomnia  Rosacea    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HCC)    History of melanoma    Hypertension    Hyperlipemia    Osteoarthritis of multiple joints    Arthritis of knee, left 07/14/2016   Foot pain 03/17/2013   Melanoma of skin (HCC) 09/13/2011    PCP: Stann Mainland. Sampson Goon, MD  REFERRING PROVIDER: Sheppard Plumber, NP  REFERRING DIAG: I89.0  THERAPY DIAG:  Lymphedema, not elsewhere classified [I89.0]  Rationale for Evaluation and Treatment: Rehabilitation  ONSET DATE: Onset of RLE lymphedema s/p XRT for R melanoma recurrence in 2012  SUBJECTIVE:                                                                                                                                                                                           SUBJECTIVE STATEMENT:Barbara Burgess presents to  OT for Intensive Phase CDT to cancer-related  BLE lymphedema, R>L. R leg redness and swelling remain with little change. Skin temperature is not elevated  by gross assessment. Pt denies pain. Obvious upper border visible circumferentially below the knee is unchanged. Pt asks when we shift from compression wrapping back to compression garments.  PERTINENT HISTORY:   relevant to LE: Takes NORVASC, HTN, L knee OA, hysterectomy, 1976 Tubal ligation, former smoker; CVI dx 2024  Oncology History 09/05/2011 Exploratory laparotomy and debulking of right pelvic sidewall mass.~ 2. Bilateral salpingo-oophorectomy.~ 3. Right pelvic lymph node sampling.~~~; Path: Right pelvic sidewall mass and right fallopian tube and ovary, excision; - Metastatic malignant melanoma involving matted lymph nodes with extracapsular; extension, size: 9.0 x 7.5 x 4.5 cm; ; Braf neg;  Stage III or stage IV melanoma (we cannot tell if this is a locally advanced stage III cancer or stage IV) of the right lower leg with metastasis to the right pelvic lymph nodes and ovaries.  Fall 2012: Radiation therapy L groin and LLQ  PAIN:  Are you having pain? No   PRECAUTIONS: Other: Lymphedema precautions; Hx melanoma  HAND DOMINANCE: right   PRIOR LEVEL OF FUNCTION: Independent  PATIENT GOALS: Learn how to best manage my lymphedema to keep it from getting worse   OBJECTIVE:  OBSERVATIONS / OTHER ASSESSMENTS:  Moderate, Stage  II, Cancer-related, Left Lower Extremity/ Left Lower Quadrant Lymphedema   Lymphedema Life Impact Scale (LLIS): 8.82% (The extent to which LE-related problems affected your life in the last week)   RLE COMPARATIVE LIMB VOLUMETRICS: 10 th Visit 05/07/23  [facility] RIGHT    R LEG (A-D) 3625.8  ml  R THIGH (E-G) 5896.1 ml  R FULL LIMB (A-G) 9522.0 ml  Limb Volume differential (LVD)  %  Volume change  since last measured on 03/29/23 R leg (A-D) DECREASED by 0.5%. R thigh (E-G) DECREASED by 3.5%. RLE full limb volume is DECREASED 2.4%.   Volume change since initial   (Blank rows = not tested)  BLE COMPARATIVE LIMB VOLUMETRICS: Initial 03/29/23  [facility]  RIGHT    R LEG (A-D) 3644.0 ml  R THIGH (E-G) 6111.3 ml  R FULL LIMB (A-G) 9755.2 ml  Limb Volume differential (LVD)  %  Volume change since initial %  Volume change overall V  (Blank rows = not tested)  [facility] LEFT   L LEG (A-D) 4076.7 ml  L THIGH (E-G) 6365.5 ml  L FULL LIMB (A-G) 10443.15 ml  Limb Volume differential (LVD)  LEG LVD= 12.9%, L>R Thigh LVD =4.2 %. L>R Full LIMB LVD = 7.05%, L>R  Volume change since initial %  Volume change overall %  (Blank rows = not tested)     TODAY'S TREATMENT:   RLE RLQ MLD, as established. RLE thigh length multilayer gradient compression wraps Compression garment education and anatomical measurements   PATIENT EDUCATION:  Continued Pt/ CG edu for lymphedema self care home program throughout session. Topics include outcome of comparative limb volumetrics- starting limb volume differentials (LVDs), technology and gradient techniques used for short stretch, multilayer compression wrapping, simple self-MLD, therapeutic lymphatic pumping exercises, skin/nail care, LE precautions,. compression garment recommendations and specifications, wear and care schedule and compression garment donning / doffing w assistive devices. Discussed progress towards all OT goals since commencing CDT. All questions answered to the Pt's satisfaction. Good return.  FLEXITOUCH PRECAUTIONS: Pt education re precautions related to use of advanced vaso-pneumatic device. Pt verbalized understanding that she should never use device on 2 legs simultaneously and should not use device 2 x on same day to avoid overloading her heart with fluid volume return both at present, and in years to come should her medical condition change. Pt instructed to remove device immediately should she experience atypical SOP, light headedness, or acute pain. Pt instructed to discontinue pump if she suspects, or has any infection, blood clot, cellulitis, the flu, corona virus, etc. Person educated:  Patient Education method: Explanation, Demonstration, Verbal and Tactile cues, and Handouts Education comprehension: verbalized understanding, returned demonstration, verbal cues required, and needs further education  LYMPHEDEMA SELF-CARE HOME PROGRAM: all to be performed daily and ongoing as LE is a chronic, progressive condition with no cure RLE/RLQ Simple Self-Manual Lymphatic Drainage (MLD): In supine and side lying utilizing bilateral short neck sequence, deep abdominal pathways via diaphragmatic breathing, ipsilateral axillary inguinal  anastomosis, then inguinal LN, and dynamic J strokes from proximal to distal staring at groin and thigh, then knee, then leg segment, and finally  retrograde sweeps back to terminus. Skin care to limit infection risk and facilitate essential skin excursion or optimal lymphatic function Lymphatic Pumping there ex- 2 x daily, in sequence, 10 reps bilaterally. Hole 5 seconds each Multilayer compression wrapping: 1 each 8 and 10 cm wide short stretch bandage applied  using overlapping, gradient techniques starting at base of the and ending at popliteal fossa. Bandages applied over a single layer of 0.4 cm thick Rosidal foam and cotton stockinett underneath all. During Intensive Phase CDT patients undergo short stretch bandaging using gradient techniques one limb at a time, toes to groin During Self-management phase of CDT Pt's utilize appropriate compression garments and HOS devices that meet their individual needs, abilities and lifestyle.   Custom-made gradient compression garments and HOS devices are medically necessary in this case because  they are uniquely sized and shaped to fit the exact dimensions of the affected extremities with deformities, and to provide accurate and consistent gradient compression and containment, essential to optimally managing this patient's symptoms of chronic, progressive lymphedema. Multiple custom compression garments are needed for  optimal hygiene to limit infection risk. Custom compression garments should be replaced q 3-6 months When worn consistently for optimal lipo-lymphedema self-management over time.  ASSESSMENT: CLINICAL IMPRESSION:  R leg redness and swelling remain with little change. Skin temperature is not elevated by gross assessment. Pt denies pain. Obvious upper border visible circumferentially below the knee is unchanged. Continued MLD utilizing alternative axillary pathway was well tolerated without pain, and Pt requested full length thigh high compression wraps after manual therapy denying pain. Provided Pt edu re compression garment / device options and recommendations based on her unique clinical needs and lifestyle. Opting for off-the-shelf, ccl 1 (20-30 mmHg), Juzo SOFT pantyhose , circular knit, closed toe. Completed anatomical measurements and faxed to DME vendor after session. Pt educated on process of procuring garments through vendor and steps for accessing insurance coverage. Fit garments ASAP.Cont as per POC.   NOTE: Medical necessity for Advanced , Sequential Pneumatic Flexitouch Device: Continued Pt edu re pneumatic compression devices throughout session. Ms Gobbi will benefit from a Flexitouch Plus advanced device to assist her with long term self-management of stage II, RLE/RLQ lymphedema, including abdomen, 2/2 treatment for endometrial cancer. Ms. Shonka has undergone skilled Occupational Therapy for conservative Complete Decongestive Therapy (CDT) 2 x weekly for greater than 4 weeks with significant signs/symptoms of chronic lymphedema persisting. CDT includes multi-layer compression with short stretch bandages during the Intensive Phase, appropriate compression garments once limb volume reduction plateaus in the self-management phase, therapeutic lymphatic pumping exercise, skin care daily to limit infection risk and progression, and daily manual lymphatic drainage (MLD) and limb elevation. Pt is 100%  compliant with all self-management home program components. She used a basic ([id number]) vaso pneumatic compression "pump" (model and serial number unknown)   for several weeks. This basic device provides squeeze and release compression  from the ankle to the proximal thigh only, and does not address this patient's groin and abdominal involvement  2/2 cancer Rx. The basic device failed to control chronic, progressive lymphedema in the right lower extremity and lower quadrant (abdomen and groin). Chronic swelling is unchanged, lymphedema-associated pain and discomfort persists, and high-protein tissue fibrosis is unchanged . Pt will benefit from the Flexitouch PLUS advanced [phone]) sequential pneumatic compression device to reduce limb volume and fibrosis, to reduce infection risk, and to optimally manage chronic, progressive lymphedema over time at home.   OBJECTIVE IMPAIRMENTS: decreased knowledge of condition, decreased knowledge of use of DME, chronic progressive BLE swelling, R>L , LE associated pain and skin changes,   ACTIVITY LIMITATIONS: gravity dependent sitting, standing and extended walking exacerbate limb swelling and associated pain limiting functional ambulation and mobility; limb swelling limits Pt's ability to fit street shoes and socks; BLE LE and pain limits social participation; LE limits body image, and  "gets me down" causing feelings of anger, depression or frustration.   PARTICIPATION LIMITATIONS: painting hobby, social participation in community activities requiring extended sitting, standing, walking  PERSONAL FACTORS: Time since onset of injury/illness/exacerbation and 1-2 comorbidities: (Ca hx w XRT, HTN)  are also affecting patient's functional outcome.   REHAB POTENTIAL: Good    GOALS: Goals reviewed with patient? Yes  SHORT TERM GOALS: Target date: 4th OT Rx visit  Pt will demonstrate understanding  of lymphedema precautions and prevention strategies with modified  independence using a printed reference to identify at least 5 precautions and discussing how s/he may implement them into daily life to reduce risk of progression with extra time. Baseline:Max A Goal status: 05/07/23 PROGRESSING- additional time needed to complete  2.  Pt will be able to apply multilayer, thigh length, gradient, compression wraps to one leg at a time with modified assistance (extra time) to decrease limb volume, to limit infection risk, and to limit lymphedema progression.  Baseline: Max A Goal status: 05/07/23 GOAL MET   LONG TERM GOALS: Target date: 06/20/23  1.  Given this patient's Intake score of 8.82 % on the Lymphedema Life Impact Scale (LLIS), patient will experience a reduction of at least 5 points in her perceived level of functional impairment resulting from lymphedema to improve functional performance and quality of life (QOL). Baseline: 8.82% Goal status: 05/07/23 ONGOING  3.   During Intensive phase CDT Pt will achieve at least 85% compliance with all lymphedema self-care home program components, including daily skin care, compression wraps and /or garments, simple self MLD and lymphatic pumping therex to habituate LE self care protocol  into ADLs for optimal LE self-management over time. Baseline: Max A Goal status: 05/07/23 GOAL MET  4.  Pt will achieve at least a 10% volume reduction in the LLE to return limb to typical size and shape, to limit infection risk and LE progression, to decrease pain, to improve function. Baseline: Dependent Goal status: 05/07/23 PROGRESSING  5.  Pt will obtain proper, recommended compression garments/devices and be modified independent with donning/doffing to optimize limb volume reduction and limit LE  progression over time. Baseline:  Goal status: 05/07/23 ONGOING  PLAN:  PT FREQUENCY: 2x/week and PRN  PT DURATION: 12 weeks and PRN  PLANNED INTERVENTIONS:  Complete Decongestive Therapy: Therapeutic lymphatic pumping  exercises, Manual lymph drainage (MLD), Multilayer, gradient Compression bandaging, Manual therapy, skin care to limit infection risk, scar massage; During self management phase Pt will be fit with appropriate compression garments and devices,  Therapeutic activities Patient/Family education for LE Self Care home program  PLAN FOR NEXT SESSION:  LLE knee length multilayer compression wraps MLD to RLE/RLQ LLIS before next Progress report  Loel Dubonnet, MS, OTR/L, CLT-LANA 05/28/23 11:38 AM

## 2023-05-30 ENCOUNTER — Ambulatory Visit: Payer: Medicare PPO | Admitting: Occupational Therapy

## 2023-05-30 DIAGNOSIS — I890 Lymphedema, not elsewhere classified: Secondary | ICD-10-CM | POA: Diagnosis not present

## 2023-06-04 ENCOUNTER — Encounter (INDEPENDENT_AMBULATORY_CARE_PROVIDER_SITE_OTHER): Payer: Self-pay

## 2023-06-05 ENCOUNTER — Ambulatory Visit
Admission: RE | Admit: 2023-06-05 | Discharge: 2023-06-05 | Disposition: A | Discharge status: Home / Self Care | Payer: Medicare PPO | Source: Ambulatory Visit | Attending: Infectious Diseases | Admitting: Infectious Diseases

## 2023-06-05 DIAGNOSIS — Z1231 Encounter for screening mammogram for malignant neoplasm of breast: Secondary | ICD-10-CM | POA: Diagnosis present

## 2023-06-05 NOTE — Therapy (Unsigned)
OUTPATIENT OCCUPATIONAL THERAPY TREATMENT NOTE  LOWER EXTREMITY LYMPHEDEMA  Patient Name: Barbara Burgess MRN: [id number] DOB:26-May-1946, 77 y.o., female Today's Date: 06/05/2023  END OF SESSION:   .o     Past Medical History:  Diagnosis Date   Arthritis    knee and hip,  per [facility] New Patient Packet.    Bilateral bunions    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HCC)     per [facility] New Patient Packet.    Colitis    H/O CT scan of chest 11/27/2018   By Roland Rack, per [facility] New Patient Packet.    H/O mammogram 11/27/2018   By Pearlie Oyster at [facility], per [facility] New Patient Packet.    History of CT scan of abdomen 11/27/2018   By Roland Rack, per [facility] New Patient Packet.    History of upper extremity x-ray 11/27/2018   By Lonny Prude, of Shoulder, per [facility] New Patient Packet.    History of x-ray of hip 11/27/2018   By Lonny Prude, of Hip, per [facility] New Patient Packet.    Hyperlipemia     per [facility] New Patient Packet.    Hypertension     per [facility] New Patient Packet.    Impingement syndrome of shoulder     per [facility] New Patient Packet.    Incontinence    Mild Nighttime per [facility] New Patient Packet.   Insomnia    Melanoma (HCC)    right lower leg; right ovary   Osteoarthritis of multiple joints    Rosacea     per [facility] New Patient Packet.    Sleep disorder    Past Surgical History:  Procedure Laterality Date   ABDOMINAL HYSTERECTOMY  11/28/1987   Partial per [facility] New Patient Packet   ARTHROSCOPIC REPAIR ACL Left 11/28/2003   Left Knee/ Torn Medial Meniscus per [facility] New Patient Packet   BREAST BIOPSY     CATARACT EXTRACTION W/ INTRAOCULAR LENS IMPLANT Bilateral    COLONOSCOPY  11/27/2016   By Liberty Handy,  per [facility] New Patient Packet.    COLONOSCOPY  11/27/2017   By Liberty Handy, per [facility] New Patient Packet.    HYSTEROTOMY  11/28/1987   per [facility] New Patient Packet   KNEE SURGERY     MELANOMA EXCISION  11/27/1998   Right Shin per [facility] New Patient Packet   OOPHORECTOMY   11/27/2010   along with melanoma mass per [facility] New Patient Packet   TONSILLECTOMY  11/27/1950   per [facility] New Patient Packet   TOTAL KNEE ARTHROPLASTY Left 12/20/2021   Procedure: TOTAL KNEE ARTHROPLASTY;  Surgeon: Juanell Fairly, MD;  Location: [facility] ORS;  Service: Orthopedics;  Laterality: Left;   TUBAL LIGATION  1976   Patient Active Problem List   Diagnosis Date Noted   Chronic venous insufficiency 03/25/2023   Lymphedema 12/24/2022   S/P TKR (total knee replacement) using cement, 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 Sampson Goon, MD  REFERRING PROVIDER: Sheppard Plumber, NP  REFERRING DIAG: I89.0  THERAPY DIAG:  Lymphedema, not elsewhere classified [I89.0]  Rationale for Evaluation and Treatment:  Rehabilitation  ONSET DATE: Onset of RLE lymphedema s/p XRT for R melanoma recurrence in 2012  SUBJECTIVE:                                                                                                                                                                                           SUBJECTIVE STATEMENT:Susan Geesaman presents to  OT for Intensive Phase CDT to cancer-related  BLE lymphedema, R>L. R leg redness and swelling remain with little change. Skin temperature is not elevated by gross assessment. Pt denies pain. Obvious upper border visible circumferentially below the knee is unchanged. Pt asks when we shift from compression wrapping back to compression garments.  PERTINENT HISTORY:   relevant to LE: Takes NORVASC, HTN, L knee OA, hysterectomy, 1976 Tubal ligation, former smoker; CVI dx 2024  Oncology  History 09/05/2011 Exploratory laparotomy and debulking of right pelvic sidewall mass.~ 2. Bilateral salpingo-oophorectomy.~ 3. Right pelvic lymph node sampling.~~~; Path: Right pelvic sidewall mass and right fallopian tube and ovary, excision; - Metastatic malignant melanoma involving matted lymph nodes with extracapsular; extension, size: 9.0 x 7.5 x 4.5 cm; ; Braf neg;  Stage III or stage IV melanoma (we cannot tell if this is a locally advanced stage III cancer or stage IV) of the right lower leg with metastasis to the right pelvic lymph nodes and ovaries.  Fall 2012: Radiation therapy L groin and LLQ  PAIN:  Are you having pain? No   PRECAUTIONS: Other: Lymphedema precautions; Hx melanoma  HAND DOMINANCE: right   PRIOR LEVEL OF FUNCTION: Independent  PATIENT GOALS: Learn how to best manage my lymphedema to keep it from getting worse   OBJECTIVE:  OBSERVATIONS / OTHER ASSESSMENTS:  Moderate, Stage  II, Cancer-related, Left Lower Extremity/ Left Lower Quadrant Lymphedema   Lymphedema Life Impact Scale (LLIS): 8.82% (The extent to which LE-related problems affected your life in the last week)   RLE COMPARATIVE LIMB VOLUMETRICS: 10 th Visit 05/07/23  [facility] RIGHT    R LEG (A-D) 3625.8  ml  R THIGH (E-G) 5896.1 ml  R FULL LIMB (A-G) 9522.0 ml  Limb Volume differential (LVD)  %  Volume change since last measured on 03/29/23 R leg (A-D) DECREASED by 0.5%. R thigh (E-G) DECREASED by 3.5%. RLE full limb volume is DECREASED 2.4%.   Volume change since initial   (Blank rows = not tested)  BLE COMPARATIVE LIMB VOLUMETRICS: Initial 03/29/23  [facility] RIGHT    R LEG (A-D) 3644.0 ml  R THIGH (E-G) 6111.3 ml  R FULL LIMB (A-G) 9755.2 ml  Limb Volume differential (LVD)  %  Volume change since initial %  Volume change overall V  (Blank rows = not tested)  [facility] LEFT   L LEG (A-D) 4076.7 ml  L THIGH (E-G) 6365.5 ml  L FULL LIMB (A-G) 10443.15 ml  Limb Volume differential  (LVD)  LEG LVD= 12.9%, L>R Thigh LVD =4.2 %. L>R Full LIMB LVD = 7.05%, L>R  Volume change since initial %  Volume change overall %  (Blank rows = not tested)     TODAY'S TREATMENT:   RLE RLQ MLD, as established. RLE thigh length multilayer gradient compression wraps Compression garment education and anatomical measurements   PATIENT EDUCATION:  Continued Pt/ CG edu for lymphedema self care home program throughout session. Topics include outcome of comparative limb volumetrics- starting limb volume differentials (LVDs), technology and gradient techniques used for short stretch, multilayer compression wrapping, simple self-MLD, therapeutic lymphatic pumping exercises, skin/nail care, LE precautions,. compression garment recommendations and specifications, wear and care schedule and compression garment donning / doffing w assistive devices. Discussed progress towards all OT goals since commencing CDT. All questions answered to the Pt's satisfaction. Good return.  FLEXITOUCH PRECAUTIONS: Pt education re precautions related to use of advanced vaso-pneumatic device. Pt verbalized understanding that she should never use device on 2 legs simultaneously and should not use device 2 x on same day to avoid overloading her heart with fluid volume return both at present, and in years to come should her medical condition change. Pt instructed to remove device immediately should she experience atypical SOP, light headedness, or acute pain. Pt instructed to discontinue pump if she suspects, or has any infection, blood clot, cellulitis, the flu, corona virus, etc. Person educated: Patient Education method: Explanation, Demonstration, Verbal and Tactile cues, and Handouts Education comprehension: verbalized understanding, returned demonstration, verbal cues required, and needs further education  LYMPHEDEMA SELF-CARE HOME PROGRAM: all to be performed daily and ongoing as LE is a chronic, progressive condition  with no cure RLE/RLQ Simple Self-Manual Lymphatic Drainage (MLD): In supine and side lying utilizing bilateral short neck sequence, deep abdominal pathways via diaphragmatic breathing, ipsilateral axillary inguinal  anastomosis, then inguinal LN, and dynamic J strokes from proximal to distal staring at groin and thigh, then knee, then leg segment, and finally  retrograde sweeps back to terminus. Skin care to limit infection risk and facilitate essential skin excursion or optimal lymphatic function Lymphatic Pumping there ex- 2 x daily, in sequence, 10 reps bilaterally. Hole 5 seconds each Multilayer compression wrapping: 1 each 8 and 10 cm wide short stretch bandage applied  using overlapping, gradient techniques starting at base of the and ending at popliteal fossa. Bandages applied over a single layer of 0.4 cm thick Rosidal foam and cotton stockinett underneath all. During Intensive Phase CDT patients undergo short stretch bandaging using gradient techniques one limb at a time, toes to groin During Self-management phase of CDT Pt's utilize appropriate compression garments and HOS devices that meet their individual needs, abilities and lifestyle.   Custom-made gradient compression garments and HOS devices are medically necessary in this case because they are uniquely sized and shaped to fit the exact dimensions of the affected extremities with deformities, and to provide accurate and consistent gradient compression and containment, essential to optimally managing this patient's symptoms of chronic, progressive lymphedema. Multiple custom compression garments are needed for optimal hygiene to limit infection risk. Custom compression garments should be replaced q 3-6 months When worn consistently for optimal lipo-lymphedema self-management over time.  ASSESSMENT: CLINICAL IMPRESSION:  R leg redness and swelling remain with little  change. Skin temperature is not elevated by gross assessment. Pt denies pain.  Obvious upper border visible circumferentially below the knee is unchanged. Continued MLD utilizing alternative axillary pathway was well tolerated without pain, and Pt requested full length thigh high compression wraps after manual therapy denying pain. Provided Pt edu re compression garment / device options and recommendations based on her unique clinical needs and lifestyle. Opting for off-the-shelf, ccl 1 (20-30 mmHg), Juzo SOFT pantyhose , circular knit, closed toe. Completed anatomical measurements and faxed to DME vendor after session. Pt educated on process of procuring garments through vendor and steps for accessing insurance coverage. Fit garments ASAP.Cont as per POC.   NOTE: Medical necessity for Advanced , Sequential Pneumatic Flexitouch Device: Continued Pt edu re pneumatic compression devices throughout session. Ms Savard will benefit from a Flexitouch Plus advanced device to assist her with long term self-management of stage II, RLE/RLQ lymphedema, including abdomen, 2/2 treatment for endometrial cancer. Ms. Serfass has undergone skilled Occupational Therapy for conservative Complete Decongestive Therapy (CDT) 2 x weekly for greater than 4 weeks with significant signs/symptoms of chronic lymphedema persisting. CDT includes multi-layer compression with short stretch bandages during the Intensive Phase, appropriate compression garments once limb volume reduction plateaus in the self-management phase, therapeutic lymphatic pumping exercise, skin care daily to limit infection risk and progression, and daily manual lymphatic drainage (MLD) and limb elevation. Pt is 100% compliant with all self-management home program components. She used a basic ([id number]) vaso pneumatic compression "pump" (model and serial number unknown)   for several weeks. This basic device provides squeeze and release compression  from the ankle to the proximal thigh only, and does not address this patient's groin and abdominal  involvement  2/2 cancer Rx. The basic device failed to control chronic, progressive lymphedema in the right lower extremity and lower quadrant (abdomen and groin). Chronic swelling is unchanged, lymphedema-associated pain and discomfort persists, and high-protein tissue fibrosis is unchanged . Pt will benefit from the Flexitouch PLUS advanced [phone]) sequential pneumatic compression device to reduce limb volume and fibrosis, to reduce infection risk, and to optimally manage chronic, progressive lymphedema over time at home.   OBJECTIVE IMPAIRMENTS: decreased knowledge of condition, decreased knowledge of use of DME, chronic progressive BLE swelling, R>L , LE associated pain and skin changes,   ACTIVITY LIMITATIONS: gravity dependent sitting, standing and extended walking exacerbate limb swelling and associated pain limiting functional ambulation and mobility; limb swelling limits Pt's ability to fit street shoes and socks; BLE LE and pain limits social participation; LE limits body image, and  "gets me down" causing feelings of anger, depression or frustration.   PARTICIPATION LIMITATIONS: painting hobby, social participation in community activities requiring extended sitting, standing, walking  PERSONAL FACTORS: Time since onset of injury/illness/exacerbation and 1-2 comorbidities: (Ca hx w XRT, HTN)  are also affecting patient's functional outcome.   REHAB POTENTIAL: Good    GOALS: Goals reviewed with patient? Yes  SHORT TERM GOALS: Target date: 4th OT Rx visit  Pt will demonstrate understanding of lymphedema precautions and prevention strategies with modified independence using a printed reference to identify at least 5 precautions and discussing how s/he may implement them into daily life to reduce risk of progression with extra time. Baseline:Max A Goal status: 05/07/23 PROGRESSING- additional time needed to complete  2.  Pt will be able to apply multilayer, thigh length, gradient,  compression wraps to one leg at a time with modified assistance (extra time) to decrease limb volume, to limit infection  risk, and to limit lymphedema progression.  Baseline: Max A Goal status: 05/07/23 GOAL MET   LONG TERM GOALS: Target date: 06/20/23  1.  Given this patient's Intake score of 8.82 % on the Lymphedema Life Impact Scale (LLIS), patient will experience a reduction of at least 5 points in her perceived level of functional impairment resulting from lymphedema to improve functional performance and quality of life (QOL). Baseline: 8.82% Goal status: 05/07/23 ONGOING  3.   During Intensive phase CDT Pt will achieve at least 85% compliance with all lymphedema self-care home program components, including daily skin care, compression wraps and /or garments, simple self MLD and lymphatic pumping therex to habituate LE self care protocol  into ADLs for optimal LE self-management over time. Baseline: Max A Goal status: 05/07/23 GOAL MET  4.  Pt will achieve at least a 10% volume reduction in the LLE to return limb to typical size and shape, to limit infection risk and LE progression, to decrease pain, to improve function. Baseline: Dependent Goal status: 05/07/23 PROGRESSING  5.  Pt will obtain proper, recommended compression garments/devices and be modified independent with donning/doffing to optimize limb volume reduction and limit LE  progression over time. Baseline:  Goal status: 05/07/23 ONGOING  PLAN:  PT FREQUENCY: 2x/week and PRN  PT DURATION: 12 weeks and PRN  PLANNED INTERVENTIONS:  Complete Decongestive Therapy: Therapeutic lymphatic pumping exercises, Manual lymph drainage (MLD), Multilayer, gradient Compression bandaging, Manual therapy, skin care to limit infection risk, scar massage; During self management phase Pt will be fit with appropriate compression garments and devices,  Therapeutic activities Patient/Family education for LE Self Care home program  PLAN FOR NEXT  SESSION:  LLE knee length multilayer compression wraps MLD to RLE/RLQ LLIS before next Progress report  Loel Dubonnet, MS, OTR/L, CLT-LANA 06/05/23 2:29 PM

## 2023-06-06 ENCOUNTER — Ambulatory Visit: Payer: Medicare PPO | Admitting: Occupational Therapy

## 2023-06-06 ENCOUNTER — Encounter: Payer: Self-pay | Admitting: Occupational Therapy

## 2023-06-06 DIAGNOSIS — I890 Lymphedema, not elsewhere classified: Secondary | ICD-10-CM | POA: Diagnosis not present

## 2023-06-06 NOTE — Therapy (Signed)
OUTPATIENT OCCUPATIONAL THERAPY TREATMENT NOTE  LOWER EXTREMITY LYMPHEDEMA  Patient Name: Barbara Burgess MRN: [id number] DOB:11-Dec-1945, 77 y.o., female Today's Date: 06/06/2023  END OF SESSION:   OT End of Session - 06/06/23 1246     Visit Number 19    Number of Visits 36    Date for OT Re-Evaluation 06/19/23    OT Start Time 1000    OT Stop Time 1115    OT Time Calculation (min) 75 min    Activity Tolerance Patient tolerated treatment well;No increased pain    Behavior During Therapy WFL for tasks assessed/performed                    Past Medical History:  Diagnosis Date   Arthritis    knee and hip,  per [facility].    Bilateral bunions    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HCC)     per [facility] Patient Packet.    Colitis    H/O CT scan of chest 11/27/2018   By Roland Rack, per [facility] Patient Packet.    H/O mammogram 11/27/2018   By Pearlie Oyster at [facility], per [facility] New Patient Packet.    History of CT scan of abdomen 11/27/2018   By Roland Rack, per [facility] Patient Packet.    History of upper extremity x-ray 11/27/2018   By Lonny Prude, of Shoulder, per [facility] New Patient Packet.    History of x-ray of hip 11/27/2018   By Lonny Prude, of Hip, per [facility] New Patient Packet.    Hyperlipemia     per [facility] Patient Packet.    Hypertension     per [facility] New Patient Packet.    Impingement syndrome of shoulder     per [facility] New Patient Packet.    Incontinence    Mild Nighttime per [facility] New Patient Packet.   Insomnia    Melanoma (HCC)    right lower leg; right ovary   Osteoarthritis of multiple joints    Rosacea     per [facility] New Patient Packet.    Sleep disorder    Past Surgical History:  Procedure Laterality Date   ABDOMINAL HYSTERECTOMY  11/28/1987   Partial per [facility] Patient Packet   ARTHROSCOPIC REPAIR ACL Left 11/28/2003   Left Knee/ Torn Medial Meniscus per [facility] New Patient Packet   BREAST BIOPSY     CATARACT EXTRACTION W/  INTRAOCULAR LENS IMPLANT Bilateral    COLONOSCOPY  11/27/2016   By Liberty Handy,  per [facility] Patient Packet.    COLONOSCOPY  11/27/2017   By Liberty Handy, per [facility] Patient Packet.    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   Procedure: TOTAL KNEE ARTHROPLASTY;  Surgeon: Juanell Fairly, MD;  Location: [facility] ORS;  Service: Orthopedics;  Laterality: Left;   TUBAL LIGATION  1976   Patient Active Problem List   Diagnosis Date Noted   Chronic venous insufficiency 03/25/2023   Lymphedema 12/24/2022   S/P TKR (total knee replacement) using cement, left 12/20/2021   Sesamoiditis 05/06/2020   Hav (hallux abducto valgus), unspecified laterality 01/08/2020   Porokeratosis 01/08/2020  Insomnia    Rosacea    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HCC)    History of melanoma    Hypertension    Hyperlipemia    Osteoarthritis of multiple joints    Arthritis of knee, left 07/14/2016   Foot pain 03/17/2013   Melanoma of skin (HCC) 09/13/2011    PCP: Stann Mainland. Sampson Goon, MD  REFERRING PROVIDER: Sheppard Plumber, NP  REFERRING DIAG: I89.0  THERAPY DIAG:  Lymphedema, not elsewhere classified [I89.0]  Rationale for Evaluation and Treatment: Rehabilitation  ONSET DATE: Onset of RLE lymphedema s/p XRT for R melanoma recurrence in 2012  SUBJECTIVE:                                                                                                                                                                                           SUBJECTIVE STATEMENT:Barbara Burgess presents to  OT for Intensive Phase CDT to cancer-related  BLE lymphedema, R>L. R leg venous stasis remains visible, but limb swelling and redness are  reduced slightly. Pt denies LE-related pain this morning. She reports she has Flexitouch trial this afternoon at home with Programmer, applications.  PERTINENT HISTORY:   relevant to LE: Takes NORVASC, HTN, L knee OA, hysterectomy, 1976 Tubal ligation, former smoker; CVI dx 2024  Oncology History 09/05/2011 Exploratory laparotomy and debulking of right pelvic sidewall mass.~ 2. Bilateral salpingo-oophorectomy.~ 3. Right pelvic lymph node sampling.~~~; Path: Right pelvic sidewall mass and right fallopian tube and ovary, excision; - Metastatic malignant melanoma involving matted lymph nodes with extracapsular; extension, size: 9.0 x 7.5 x 4.5 cm; ; Braf neg;  Stage III or stage IV melanoma (we cannot tell if this is a locally advanced stage III cancer or stage IV) of the right lower leg with metastasis to the right pelvic lymph nodes and ovaries.  Fall 2012: Radiation therapy L groin and LLQ  PAIN:  Are you having pain? No   PRECAUTIONS: Other: Lymphedema precautions; Hx melanoma  HAND DOMINANCE: right   PRIOR LEVEL OF FUNCTION: Independent  PATIENT GOALS: Learn how to best manage my lymphedema to keep it from getting worse   OBJECTIVE:  OBSERVATIONS / OTHER ASSESSMENTS:  Moderate, Stage  II, Cancer-related, Left Lower Extremity/ Left Lower Quadrant Lymphedema   Lymphedema Life Impact Scale (LLIS): 8.82% (The extent to which LE-related problems affected your life in the last week)   RLE COMPARATIVE LIMB VOLUMETRICS: 10 th Visit 05/07/23  [facility] RIGHT    R LEG (A-D) 3625.8  ml  R THIGH (E-G) 5896.1 ml  R FULL LIMB (A-G) 9522.0 ml  Limb Volume differential (LVD)  %  Volume change since last measured on  03/29/23 R leg (A-D) DECREASED by 0.5%. R thigh (E-G) DECREASED by 3.5%. RLE full limb volume is DECREASED 2.4%.   Volume change since initial   (Blank rows = not tested)  BLE COMPARATIVE LIMB VOLUMETRICS: Initial 03/29/23  [facility] RIGHT    R LEG (A-D) 3644.0 ml  R THIGH  (E-G) 6111.3 ml  R FULL LIMB (A-G) 9755.2 ml  Limb Volume differential (LVD)  %  Volume change since initial %  Volume change overall V  (Blank rows = not tested)  [facility] LEFT   L LEG (A-D) 4076.7 ml  L THIGH (E-G) 6365.5 ml  L FULL LIMB (A-G) 10443.15 ml  Limb Volume differential (LVD)  LEG LVD= 12.9%, L>R Thigh LVD =4.2 %. L>R Full LIMB LVD = 7.05%, L>R  Volume change since initial %  Volume change overall %  (Blank rows = not tested)     TODAY'S TREATMENT:   RLE RLQ MLD, as established. RLE thigh length multilayer gradient compression wraps Pt edu   PATIENT EDUCATION: Reviewed  Flexitouch precautions in prep for Pt's home trial. FLEXITOUCH PRECAUTIONS: Pt education re precautions related to use of advanced vaso-pneumatic device. Pt verbalized understanding that she should never use device on 2 legs simultaneously and should not use device 2 x on same day to avoid overloading her heart with fluid volume return both at present, and in years to come should her medical condition change. Pt instructed to remove device immediately should she experience atypical SOP, light headedness, or acute pain. Pt instructed to discontinue pump if she suspects, or has any infection, blood clot, cellulitis, the flu, corona virus, etc.  Continued Pt/ CG edu for lymphedema self care home program throughout session. Topics include outcome of comparative limb volumetrics- starting limb volume differentials (LVDs), technology and gradient techniques used for short stretch, multilayer compression wrapping, simple self-MLD, therapeutic lymphatic pumping exercises, skin/nail care, LE precautions,. compression garment recommendations and specifications, wear and care schedule and compression garment donning / doffing w assistive devices. Discussed progress towards all OT goals since commencing CDT. All questions answered to the Pt's satisfaction. Good return.  Person educated: Patient Education method:  Explanation, Demonstration, Verbal and Tactile cues, and Handouts Education comprehension: verbalized understanding, returned demonstration, verbal cues required, and needs further education  LYMPHEDEMA SELF-CARE HOME PROGRAM: all to be performed daily and ongoing as LE is a chronic, progressive condition with no cure RLE/RLQ Simple Self-Manual Lymphatic Drainage (MLD): In supine and side lying utilizing bilateral short neck sequence, deep abdominal pathways via diaphragmatic breathing, ipsilateral axillary inguinal  anastomosis, then inguinal LN, and dynamic J strokes from proximal to distal staring at groin and thigh, then knee, then leg segment, and finally  retrograde sweeps back to terminus. Skin care to limit infection risk and facilitate essential skin excursion or optimal lymphatic function Lymphatic Pumping there ex- 2 x daily, in sequence, 10 reps bilaterally. Hole 5 seconds each Multilayer compression wrapping: 1 each 8 and 10 cm wide short stretch bandage applied  using overlapping, gradient techniques starting at base of the and ending at popliteal fossa. Bandages applied over a single layer of 0.4 cm thick Rosidal foam and cotton stockinett underneath all. During Intensive Phase CDT patients undergo short stretch bandaging using gradient techniques one limb at a time, toes to groin During Self-management phase of CDT Pt's utilize appropriate compression garments and HOS devices that meet their individual needs, abilities and lifestyle.  ccl 1 (20-30 mmHg), Juzo SOFT , size 3, SHORT pantyhose , circular  knit, closed toe.  Custom-made gradient compression garments and HOS devices are medically necessary in this case because they are uniquely sized and shaped to fit the exact dimensions of the affected extremities with deformities, and to provide accurate and consistent gradient compression and containment, essential to optimally managing this patient's symptoms of chronic, progressive  lymphedema. Multiple custom compression garments are needed for optimal hygiene to limit infection risk. Custom compression garments should be replaced q 3-6 months When worn consistently for optimal lipo-lymphedema self-management over time.  ASSESSMENT: CLINICAL IMPRESSION: Compression garment order was sent to DME provider on 7/3. Requested status update via email after session to assist w treatment planning. R leg swelling and redness persist but continue to reduce slightly. Pt denies RLE pain related to lymphedema, or typically associated with venous stasis or insufficiency. Continued RLE MLD to RLE/RLQ while reviewing individualized Flexitouch instructions and precautions. Multilayer compression wraps applied from base of toes to groin on RLE taking care not to pull Rosidal foam too tightly. Cont as per POC.   OBJECTIVE IMPAIRMENTS: decreased knowledge of condition, decreased knowledge of use of DME, chronic progressive BLE swelling, R>L , LE associated pain and skin changes,   ACTIVITY LIMITATIONS: gravity dependent sitting, standing and extended walking exacerbate limb swelling and associated pain limiting functional ambulation and mobility; limb swelling limits Pt's ability to fit street shoes and socks; BLE LE and pain limits social participation; LE limits body image, and  "gets me down" causing feelings of anger, depression or frustration.   PARTICIPATION LIMITATIONS: painting hobby, social participation in community activities requiring extended sitting, standing, walking  PERSONAL FACTORS: Time since onset of injury/illness/exacerbation and 1-2 comorbidities: (Ca hx w XRT, HTN)  are also affecting patient's functional outcome.   REHAB POTENTIAL: Good    GOALS: Goals reviewed with patient? Yes  SHORT TERM GOALS: Target date: 4th OT Rx visit  Pt will demonstrate understanding of lymphedema precautions and prevention strategies with modified independence using a printed reference to  identify at least 5 precautions and discussing how s/he may implement them into daily life to reduce risk of progression with extra time. Baseline:Max A Goal status: 05/07/23 PROGRESSING- additional time needed to complete 06/06/23: GOAL MET  2.  Pt will be able to apply multilayer, thigh length, gradient, compression wraps to one leg at a time with modified assistance (extra time) to decrease limb volume, to limit infection risk, and to limit lymphedema progression.  Baseline: Max A Goal status: 05/07/23 GOAL MET   LONG TERM GOALS: Target date: 06/20/23  1.  Given this patient's Intake score of 8.82 % on the Lymphedema Life Impact Scale (LLIS), patient will experience a reduction of at least 5 points in her perceived level of functional impairment resulting from lymphedema to improve functional performance and quality of life (QOL). Baseline: 8.82% Goal status: 05/07/23 ONGOING  3.   During Intensive phase CDT Pt will achieve at least 85% compliance with all lymphedema self-care home program components, including daily skin care, compression wraps and /or garments, simple self MLD and lymphatic pumping therex to habituate LE self care protocol  into ADLs for optimal LE self-management over time. Baseline: Max A Goal status: 05/07/23 GOAL MET  4.  Pt will achieve at least a 10% volume reduction in the LLE to return limb to typical size and shape, to limit infection risk and LE progression, to decrease pain, to improve function. Baseline: Dependent Goal status: 05/07/23 PROGRESSING  5.  Pt will obtain proper, recommended compression  garments/devices and be modified independent with donning/doffing to optimize limb volume reduction and limit LE  progression over time. Baseline:  Goal status: 05/07/23 ONGOING  PLAN:  PT FREQUENCY: 2x/week and PRN  PT DURATION: 12 weeks and PRN  PLANNED INTERVENTIONS:  Complete Decongestive Therapy: Therapeutic lymphatic pumping exercises, Manual lymph drainage  (MLD), Multilayer, gradient Compression bandaging, Manual therapy, skin care to limit infection risk, scar massage; During self management phase Pt will be fit with appropriate compression garments and devices,  Therapeutic activities Patient/Family education for LE Self Care home program  PLAN FOR NEXT SESSION:  PROGRESS REVIEW Pt edu for LE self-care RLE comparative limb volumetrics RLE/RLQ MLD LLE knee length multilayer compression wraps   Loel Dubonnet, MS, OTR/L, CLT-LANA 06/06/23 12:47 PM

## 2023-06-08 ENCOUNTER — Ambulatory Visit: Payer: Medicare PPO | Admitting: Occupational Therapy

## 2023-06-08 ENCOUNTER — Encounter: Payer: Self-pay | Admitting: Occupational Therapy

## 2023-06-08 DIAGNOSIS — I890 Lymphedema, not elsewhere classified: Secondary | ICD-10-CM | POA: Diagnosis not present

## 2023-06-08 NOTE — Therapy (Signed)
OUTPATIENT OCCUPATIONAL THERAPY TREATMENT NOTE AND PROGRESS REPORT  LOWER EXTREMITY LYMPHEDEMA  Patient Name: Barbara Burgess MRN: [id number] DOB:03/14/1946, 77 y.o., female Today's Date: 06/08/2023  REPORTING PERIOD: 05/07/23 - 06/08/23  END OF SESSION:   OT End of Session - 06/08/23 1013     Visit Number 20    Number of Visits 36    Date for OT Re-Evaluation 06/19/23    OT Start Time 1000    Activity Tolerance Patient tolerated treatment well;No increased pain    Behavior During Therapy WFL for tasks assessed/performed                    Past Medical History:  Diagnosis Date   Arthritis    knee and hip,  per [facility].    Bilateral bunions    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HCC)     per [facility] New Patient Packet.    Colitis    H/O CT scan of chest 11/27/2018   By Roland Rack, per [facility] Patient Packet.    H/O mammogram 11/27/2018   By Pearlie Oyster at [facility], per [facility] New Patient Packet.    History of CT scan of abdomen 11/27/2018   By Roland Rack, per [facility] New Patient Packet.    History of upper extremity x-ray 11/27/2018   By Lonny Prude, of Shoulder, per [facility] New Patient Packet.    History of x-ray of hip 11/27/2018   By Lonny Prude, of Hip, per [facility] New Patient Packet.    Hyperlipemia     per [facility] Patient Packet.    Hypertension     per [facility] Patient Packet.    Impingement syndrome of shoulder     per [facility] New Patient Packet.    Incontinence    Mild Nighttime per [facility] New Patient Packet.   Insomnia    Melanoma (HCC)    right lower leg; right ovary   Osteoarthritis of multiple joints    Rosacea     per [facility] New Patient Packet.    Sleep disorder    Past Surgical History:  Procedure Laterality Date   ABDOMINAL HYSTERECTOMY  11/28/1987   Partial per [facility] New Patient Packet   ARTHROSCOPIC REPAIR ACL Left 11/28/2003   Left Knee/ Torn Medial Meniscus per [facility] New Patient Packet   BREAST BIOPSY     CATARACT EXTRACTION W/  INTRAOCULAR LENS IMPLANT Bilateral    COLONOSCOPY  11/27/2016   By Liberty Handy,  per [facility] New Patient Packet.    COLONOSCOPY  11/27/2017   By Liberty Handy, per [facility] New Patient Packet.    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   Procedure: TOTAL KNEE ARTHROPLASTY;  Surgeon: Juanell Fairly, MD;  Location: [facility] ORS;  Service: Orthopedics;  Laterality: Left;   TUBAL LIGATION  1976   Patient Active Problem List   Diagnosis Date Noted   Chronic venous insufficiency 03/25/2023   Lymphedema 12/24/2022   S/P TKR (total knee replacement) using cement, 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 Sampson Goon, MD  REFERRING PROVIDER: Sheppard Plumber, NP  REFERRING DIAG: I89.0  THERAPY DIAG:  Lymphedema, not elsewhere classified [I89.0]  Rationale for Evaluation and Treatment: Rehabilitation  ONSET DATE: Onset of RLE lymphedema s/p XRT for R melanoma recurrence in 2012  SUBJECTIVE:                                                                                                                                                                                           SUBJECTIVE STATEMENT:Barbara Burgess presents to  OT for Intensive Phase CDT to cancer-related  BLE lymphedema, R>L. R leg venous stasis remains visible, but limb swelling and redness are  reduced slightly. Pt denies LE-related pain this morning. She reports Flexitouch with Programmer, applications went well. She believes her insurance will cover the device.  PERTINENT HISTORY:   relevant to LE: Takes NORVASC, HTN, L knee OA, hysterectomy, 1976 Tubal ligation, former smoker; CVI dx 2024  Oncology History 09/05/2011 Exploratory laparotomy and debulking of right pelvic sidewall mass.~ 2. Bilateral salpingo-oophorectomy.~ 3. Right pelvic lymph node sampling.~~~; Path: Right pelvic sidewall mass and right fallopian tube and ovary, excision; - Metastatic malignant melanoma involving matted lymph nodes with extracapsular; extension, size: 9.0 x 7.5 x 4.5 cm; ; Braf neg;  Stage III or stage IV melanoma (we cannot tell if this is a locally advanced stage III cancer or stage IV) of the right lower leg with metastasis to the right pelvic lymph nodes and ovaries.  Fall 2012: Radiation therapy L groin and LLQ  PAIN:  Are you having pain? No   PRECAUTIONS: Other: Lymphedema precautions; Hx melanoma  HAND DOMINANCE: right   PRIOR LEVEL OF FUNCTION: Independent  PATIENT GOALS: Learn how to best manage my lymphedema to keep it from getting worse   OBJECTIVE:  OBSERVATIONS / OTHER ASSESSMENTS:  Moderate, Stage  II, Cancer-related, Left Lower Extremity/ Left Lower Quadrant Lymphedema   Lymphedema Life Impact Scale (LLIS): 8.82% (The extent to which LE-related problems affected your life in the last week)  RLE COMPARATIVE LIMB VOLUMETRICS: 20 th Visit 06/08/23  [facility] RIGHT    R LEG (A-D) 3615.2  ml  R THIGH (E-G) 5946.6 ml  R FULL LIMB (A-G) 9561.7 ml  Limb Volume differential (LVD)  %  Volume change since last measured on 03/29/23 R leg (A-D) DECREASED by  0.37% since last measured on 05/07/23. R thigh (E-G) INCREASED by 0.85 %, and  RLE full limb volume is INCREASED 0.4%.   Volume change since initial Since initially measured on 03/29/23 R LEG is decreased in volume by .89%. R  thigh is reduced by 2.7%, and full R lower extremity is decreased in volume by 1.98% overall.     RLE COMPARATIVE LIMB VOLUMETRICS: 10 th Visit 05/07/23  [facility] RIGHT    R LEG (A-D) 3625.8  ml  R THIGH (E-G) 5896.1 ml  R FULL LIMB (A-G) 9522.0 ml  Limb Volume differential (LVD)  %  Volume change since last measured on 03/29/23 R leg (A-D) DECREASED by 0.5%. R thigh (E-G) DECREASED by 3.5%. RLE full limb volume is DECREASED 2.4%.   Volume change since initial   (Blank rows = not tested)  BLE COMPARATIVE LIMB VOLUMETRICS: Initial 03/29/23  [facility] RIGHT    R LEG (A-D) 3644.0 ml  R THIGH (E-G) 6111.3 ml  R FULL LIMB (A-G) 9755.2 ml  Limb Volume differential (LVD)  %  Volume change since initial %  Volume change overall V  (Blank rows = not tested)  [facility] LEFT   L LEG (A-D) 4076.7 ml  L THIGH (E-G) 6365.5 ml  L FULL LIMB (A-G) 10443.15 ml  Limb Volume differential (LVD)  LEG LVD= 12.9%, L>R Thigh LVD =4.2 %. L>R Full LIMB LVD = 7.05%, L>R  Volume change since initial %  Volume change overall %  (Blank rows = not tested)     TODAY'S TREATMENT:   Reviewed progress towards goal/ Pt edu for LE self care RLE thigh length multilayer gradient compression wraps RLE comparative limb volumetrics   PATIENT EDUCATION: Continued Pt/ CG edu for lymphedema self care home program throughout session. Topics include outcome of comparative limb volumetrics- starting limb volume differentials (LVDs), technology and gradient techniques used for short stretch, multilayer compression wrapping, simple self-MLD, therapeutic lymphatic pumping exercises, skin/nail care, LE precautions,. compression garment recommendations and specifications, wear and care schedule and compression garment donning / doffing w assistive devices. Discussed progress towards all OT goals since commencing CDT. All questions answered to the Pt's satisfaction. Good return.  Person educated: Patient Education method:  Explanation, Demonstration, Verbal and Tactile cues, and Handouts Education comprehension: verbalized understanding, returned demonstration, verbal cues required, and needs further education  LYMPHEDEMA SELF-CARE HOME PROGRAM: all to be performed daily and ongoing as LE is a chronic, progressive condition with no cure RLE/RLQ Simple Self-Manual Lymphatic Drainage (MLD): In supine and side lying utilizing bilateral short neck sequence, deep abdominal pathways via diaphragmatic breathing, ipsilateral axillary inguinal  anastomosis, then inguinal LN, and dynamic J strokes from proximal to distal staring at groin and thigh, then knee, then leg segment, and finally  retrograde sweeps back to terminus. Skin care to limit infection risk and facilitate essential skin excursion or optimal lymphatic function Lymphatic Pumping there ex- 2 x daily, in sequence, 10 reps bilaterally. Hole 5 seconds each Multilayer compression wrapping: 1 each 8 and 10 cm wide short stretch bandage applied  using overlapping, gradient techniques starting at base of the and ending at popliteal fossa. Bandages applied over a single layer of 0.4 cm thick Rosidal foam and cotton stockinett underneath all. During Intensive Phase CDT patients undergo short stretch bandaging using gradient techniques one limb at a time, toes to groin During Self-management phase of CDT Pt's utilize appropriate compression garments and HOS devices that meet their individual needs, abilities and lifestyle.  ccl  1 (20-30 mmHg), Juzo SOFT , size 3, SHORT pantyhose , circular knit, closed toe.  Custom-made gradient compression garments and HOS devices are medically necessary in this case because they are uniquely sized and shaped to fit the exact dimensions of the affected extremities with deformities, and to provide accurate and consistent gradient compression and containment, essential to optimally managing this patient's symptoms of chronic, progressive  lymphedema. Multiple custom compression garments are needed for optimal hygiene to limit infection risk. Custom compression garments should be replaced q 3-6 months When worn consistently for optimal lipo-lymphedema self-management over time.  ASSESSMENT: CLINICAL IMPRESSION: R leg (A-D) DECREASED by 0.37% since last measured on 05/07/23. R thigh (E-G)  is INCREASED by 0.85 %, and  RLE full limb volume is INCREASED 0.4%. Since initially measured on 03/29/23 R LEG is decreased in volume by .89%. R thigh is reduced by 2.7%, and full R lower extremity is decreased in volume by 1.98% overall. Essentially RLE limb volume has neither increased or significantly decreased in volume throughout CDT, but it has remained essentially stable. This is quite unexpected during the recent extended  summer heat wave when lymphedema symptoms tend to worsen for most. Pt has met most OT goals. She continues to work on a few that are nearly complete. Progress remains  steady and diligently managed  with home program. Pt agrees with plan to reduce OT Rx frequency to 1 x weekly  while awaiting garments. Cont as per POC..   OBJECTIVE IMPAIRMENTS: decreased knowledge of condition, decreased knowledge of use of DME, chronic progressive BLE swelling, R>L , LE associated pain and skin changes,   ACTIVITY LIMITATIONS: gravity dependent sitting, standing and extended walking exacerbate limb swelling and associated pain limiting functional ambulation and mobility; limb swelling limits Pt's ability to fit street shoes and socks; BLE LE and pain limits social participation; LE limits body image, and  "gets me down" causing feelings of anger, depression or frustration.   PARTICIPATION LIMITATIONS: painting hobby, social participation in community activities requiring extended sitting, standing, walking  PERSONAL FACTORS: Time since onset of injury/illness/exacerbation and 1-2 comorbidities: (Ca hx w XRT, HTN)  are also affecting patient's  functional outcome.   REHAB POTENTIAL: Good    GOALS: Goals reviewed with patient? Yes  SHORT TERM GOALS: Target date: 4th OT Rx visit  Pt will demonstrate understanding of lymphedema precautions and prevention strategies with modified independence using a printed reference to identify at least 5 precautions and discussing how s/he may implement them into daily life to reduce risk of progression with extra time. Baseline:Max A Goal status: 05/07/23 PROGRESSING- additional time needed to complete 06/10/23 GOAL MET  2.  Pt will be able to apply multilayer, thigh length, gradient, compression wraps to one leg at a time with modified assistance (extra time) to decrease limb volume, to limit infection risk, and to limit lymphedema progression.  Baseline: Max A Goal status: 05/07/23 GOAL MET   LONG TERM GOALS: Target date: 06/20/23  1.  Given this patient's Intake score of 8.82 % on the Lymphedema Life Impact Scale (LLIS), patient will experience a reduction of at least 5 points in her perceived level of functional impairment resulting from lymphedema to improve functional performance and quality of life (QOL). Baseline: 8.82% Goal status: 05/07/23 ONGOING  3.   During Intensive phase CDT Pt will achieve at least 85% compliance with all lymphedema self-care home program components, including daily skin care, compression wraps and /or garments, simple self MLD and lymphatic  pumping therex to habituate LE self care protocol  into ADLs for optimal LE self-management over time. Baseline: Max A Goal status: 05/07/23 GOAL MET  4.  Pt will achieve at least a 10% volume reduction in the LLE to return limb to typical size and shape, to limit infection risk and LE progression, to decrease pain, to improve function. Baseline: Dependent Goal status: 05/07/23 PROGRESSING 06/08/23: Minimal changes in RLE limb volumes at leg, thigh and full lower extremity. Volumes are very stable compared with initial and with  current measurements.  5.  Pt will obtain proper, recommended compression garments/devices and be modified independent with donning/doffing to optimize limb volume reduction and limit LE  progression over time. Baseline:  Goal status: 05/07/23 ONGOING 06/08/23 GOAL PROGRESSING- garments on order  PLAN:  PT FREQUENCY: Decrease from 2 x to 1 x weekly, and PRN, while awaiting garment fitting. They are on order. Flexitouch is also on order. Pt working with rep directly.  PT DURATION: 12 weeks and PRN  PLANNED INTERVENTIONS:  Complete Decongestive Therapy: Therapeutic lymphatic pumping exercises, Manual lymph drainage (MLD), Multilayer, gradient Compression bandaging, Manual therapy, skin care to limit infection risk, scar massage; During self management phase Pt will be fit with appropriate compression garments and devices,  Therapeutic activities Patient/Family education for LE Self Care home program  PLAN FOR NEXT SESSION:  RLE/RLQ MLD LLE knee length multilayer compression wraps Garment/ device fitting ASAP   Loel Dubonnet, MS, OTR/L, CLT-LANA 06/08/23 10:38 AM

## 2023-06-11 ENCOUNTER — Ambulatory Visit: Payer: Medicare PPO | Admitting: Occupational Therapy

## 2023-06-11 DIAGNOSIS — I890 Lymphedema, not elsewhere classified: Secondary | ICD-10-CM | POA: Diagnosis not present

## 2023-06-11 NOTE — Therapy (Signed)
OUTPATIENT OCCUPATIONAL THERAPY TREATMENT NOTE  LOWER EXTREMITY LYMPHEDEMA  Patient Name: Barbara Burgess MRN: [id number] DOB:07/06/1946, 77 y.o., female Today's Date: 06/11/2023  REPORTING PERIOD: 05/07/23 - 06/08/23  END OF SESSION:   OT End of Session - 06/11/23 1016     Visit Number 21    Number of Visits 36    Date for OT Re-Evaluation 06/19/23    OT Start Time 1010    OT Stop Time 1110    OT Time Calculation (min) 60 min    Activity Tolerance Patient tolerated treatment well;No increased pain    Behavior During Therapy WFL for tasks assessed/performed                    Past Medical History:  Diagnosis Date   Arthritis    knee and hip,  per [facility].    Bilateral bunions    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HCC)     per [facility] Patient Packet.    Colitis    H/O CT scan of chest 11/27/2018   By Roland Rack, per [facility] Patient Packet.    H/O mammogram 11/27/2018   By Pearlie Oyster at [facility], per [facility] New Patient Packet.    History of CT scan of abdomen 11/27/2018   By Roland Rack, per [facility] Patient Packet.    History of upper extremity x-ray 11/27/2018   By Lonny Prude, of Shoulder, per [facility] New Patient Packet.    History of x-ray of hip 11/27/2018   By Lonny Prude, of Hip, per [facility] New Patient Packet.    Hyperlipemia     per [facility] Patient Packet.    Hypertension     per [facility] Patient Packet.    Impingement syndrome of shoulder     per [facility] New Patient Packet.    Incontinence    Mild Nighttime per [facility] New Patient Packet.   Insomnia    Melanoma (HCC)    right lower leg; right ovary   Osteoarthritis of multiple joints    Rosacea     per [facility] New Patient Packet.    Sleep disorder    Past Surgical History:  Procedure Laterality Date   ABDOMINAL HYSTERECTOMY  11/28/1987   Partial per [facility] Packet   ARTHROSCOPIC REPAIR ACL Left 11/28/2003   Left Knee/ Torn Medial Meniscus per [facility] New Patient Packet    BREAST BIOPSY     CATARACT EXTRACTION W/ INTRAOCULAR LENS IMPLANT Bilateral    COLONOSCOPY  11/27/2016   By Liberty Handy,  per [facility] Patient Packet.    COLONOSCOPY  11/27/2017   By Liberty Handy, per [facility] Patient Packet.    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   Procedure: TOTAL KNEE ARTHROPLASTY;  Surgeon: Juanell Fairly, MD;  Location: [facility] ORS;  Service: Orthopedics;  Laterality: Left;   TUBAL LIGATION  1976   Patient Active Problem List   Diagnosis Date Noted   Chronic venous insufficiency 03/25/2023   Lymphedema 12/24/2022   S/P TKR (total knee replacement) using cement, left 12/20/2021   Sesamoiditis 05/06/2020   Hav (hallux abducto valgus), unspecified laterality  01/08/2020   Porokeratosis 01/08/2020   Insomnia    Rosacea    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HCC)    History of melanoma    Hypertension    Hyperlipemia    Osteoarthritis of multiple joints    Arthritis of knee, left 07/14/2016   Foot pain 03/17/2013   Melanoma of skin (HCC) 09/13/2011    PCP: Stann Mainland. Sampson Goon, MD  REFERRING PROVIDER: Sheppard Plumber, NP  REFERRING DIAG: I89.0  THERAPY DIAG:  Lymphedema, not elsewhere classified [I89.0]  Rationale for Evaluation and Treatment: Rehabilitation  ONSET DATE: Onset of RLE lymphedema s/p XRT for R melanoma recurrence in 2012  SUBJECTIVE:                                                                                                                                                                                           SUBJECTIVE STATEMENT:Barbara Burgess presents to  OT for Intensive Phase CDT to cancer-related  BLE lymphedema, R>L. R leg venous stasis remains  visible, but limb swelling and redness are reduced slightly. Pt denies LE-related pain this morning. Pt reports she has reduced her bandaging routine and is only using short stretch wraps below the knee during HOS. At present she is wearing existing ccl 1     compression pantyhose for daytime after swimming. She hopeful that her Flexitouch device will be approved by insurance soon.   PERTINENT HISTORY:   relevant to LE: Takes NORVASC, HTN, L knee OA, hysterectomy, 1976 Tubal ligation, former smoker; CVI dx 2024  Oncology History 09/05/2011 Exploratory laparotomy and debulking of right pelvic sidewall mass.~ 2. Bilateral salpingo-oophorectomy.~ 3. Right pelvic lymph node sampling.~~~; Path: Right pelvic sidewall mass and right fallopian tube and ovary, excision; - Metastatic malignant melanoma involving matted lymph nodes with extracapsular; extension, size: 9.0 x 7.5 x 4.5 cm; ; Braf neg;  Stage III or stage IV melanoma (we cannot tell if this is a locally advanced stage III cancer or stage IV) of the right lower leg with metastasis to the right pelvic lymph nodes and ovaries.  Fall 2012: Radiation therapy L groin and LLQ  PAIN:  Are you having pain? No   PRECAUTIONS: Other: Lymphedema precautions; Hx melanoma  HAND DOMINANCE: right   PRIOR LEVEL OF FUNCTION: Independent  PATIENT GOALS: Learn how to best manage my lymphedema to keep it from getting worse   OBJECTIVE:  OBSERVATIONS / OTHER ASSESSMENTS:  Moderate, Stage  II, Cancer-related, Left Lower Extremity/ Left Lower Quadrant Lymphedema   Lymphedema Life Impact Scale (LLIS): 8.82% (The extent to which LE-related problems affected your life in the last week)  RLE COMPARATIVE LIMB VOLUMETRICS:  20 th Visit 06/08/23  [facility] RIGHT    R LEG (A-D) 3615.2  ml  R THIGH (E-G) 5946.6 ml  R FULL LIMB (A-G) 9561.7 ml  Limb Volume differential (LVD)  %  Volume change since last measured on 03/29/23 R leg (A-D) DECREASED by 0.37% since  last measured on 05/07/23. R thigh (E-G) INCREASED by 0.85 %, and  RLE full limb volume is INCREASED 0.4%.   Volume change since initial Since initially measured on 03/29/23 R LEG is decreased in volume by .89%. R thigh is reduced by 2.7%, and full R lower extremity is decreased in volume by 1.98% overall.     RLE COMPARATIVE LIMB VOLUMETRICS: 10 th Visit 05/07/23  [facility] RIGHT    R LEG (A-D) 3625.8  ml  R THIGH (E-G) 5896.1 ml  R FULL LIMB (A-G) 9522.0 ml  Limb Volume differential (LVD)  %  Volume change since last measured on 03/29/23 R leg (A-D) DECREASED by 0.5%. R thigh (E-G) DECREASED by 3.5%. RLE full limb volume is DECREASED 2.4%.   Volume change since initial   (Blank rows = not tested)  BLE COMPARATIVE LIMB VOLUMETRICS: Initial 03/29/23  [facility] RIGHT    R LEG (A-D) 3644.0 ml  R THIGH (E-G) 6111.3 ml  R FULL LIMB (A-G) 9755.2 ml  Limb Volume differential (LVD)  %  Volume change since initial %  Volume change overall V  (Blank rows = not tested)  [facility] LEFT   L LEG (A-D) 4076.7 ml  L THIGH (E-G) 6365.5 ml  L FULL LIMB (A-G) 10443.15 ml  Limb Volume differential (LVD)  LEG LVD= 12.9%, L>R Thigh LVD =4.2 %. L>R Full LIMB LVD = 7.05%, L>R  Volume change since initial %  Volume change overall %  (Blank rows = not tested)     TODAY'S TREATMENT:   No multilayer gradient compression wraps RLE comparative limb volumetrics  PATIENT EDUCATION: Continued Pt/ CG edu for lymphedema self care home program throughout session. Topics include outcome of comparative limb volumetrics- starting limb volume differentials (LVDs), technology and gradient techniques used for short stretch, multilayer compression wrapping, simple self-MLD, therapeutic lymphatic pumping exercises, skin/nail care, LE precautions,. compression garment recommendations and specifications, wear and care schedule and compression garment donning / doffing w assistive devices. Discussed progress towards all OT  goals since commencing CDT. All questions answered to the Pt's satisfaction. Good return.  Person educated: Patient Education method: Explanation, Demonstration, Verbal and Tactile cues, and Handouts Education comprehension: verbalized understanding, returned demonstration, verbal cues required, and needs further education  LYMPHEDEMA SELF-CARE HOME PROGRAM: all to be performed daily and ongoing as LE is a chronic, progressive condition with no cure RLE/RLQ Simple Self-Manual Lymphatic Drainage (MLD): In supine and side lying utilizing bilateral short neck sequence, deep abdominal pathways via diaphragmatic breathing, ipsilateral axillary inguinal  anastomosis, then inguinal LN, and dynamic J strokes from proximal to distal staring at groin and thigh, then knee, then leg segment, and finally  retrograde sweeps back to terminus. Skin care to limit infection risk and facilitate essential skin excursion or optimal lymphatic function Lymphatic Pumping there ex- 2 x daily, in sequence, 10 reps bilaterally. Hole 5 seconds each Multilayer compression wrapping: 1 each 8 and 10 cm wide short stretch bandage applied  using overlapping, gradient techniques starting at base of the and ending at popliteal fossa. Bandages applied over a single layer of 0.4 cm thick Rosidal foam and cotton stockinett underneath all. During Intensive Phase CDT patients undergo short  stretch bandaging using gradient techniques one limb at a time, toes to groin During Self-management phase of CDT Pt's utilize appropriate compression garments and HOS devices that meet their individual needs, abilities and lifestyle.  ccl 1 (20-30 mmHg), Juzo SOFT , size 3, SHORT pantyhose , circular knit, closed toe.  Custom-made gradient compression garments and HOS devices are medically necessary in this case because they are uniquely sized and shaped to fit the exact dimensions of the affected extremities with deformities, and to provide accurate and  consistent gradient compression and containment, essential to optimally managing this patient's symptoms of chronic, progressive lymphedema. Multiple custom compression garments are needed for optimal hygiene to limit infection risk. Custom compression garments should be replaced q 3-6 months When worn consistently for optimal lipo-lymphedema self-management over time.  ASSESSMENT: CLINICAL IMPRESSION: Pt experimenting with varius combinations of existing compression pantyhose and wrapping while awaiting new replacement garments for fitting.  Although area of R leg venous stasis remains somewhat reddened and "puffy",  swelling in BLE is stable overall. Pt's compression garments should arrive for fitting and training any day as they were ordered from DME vendor ~2 weeks ago. Pt tolerated MLD today without difficulty. She is performing simple self MLD neck sequence a bit fast, but was able to slow down to correct speed for repetitions after skilled teaching . Cont as per POC 1 x w until fitting is complete.  OBJECTIVE IMPAIRMENTS: decreased knowledge of condition, decreased knowledge of use of DME, chronic progressive BLE swelling, R>L , LE associated pain and skin changes,   ACTIVITY LIMITATIONS: gravity dependent sitting, standing and extended walking exacerbate limb swelling and associated pain limiting functional ambulation and mobility; limb swelling limits Pt's ability to fit street shoes and socks; BLE LE and pain limits social participation; LE limits body image, and  "gets me down" causing feelings of anger, depression or frustration.   PARTICIPATION LIMITATIONS: painting hobby, social participation in community activities requiring extended sitting, standing, walking  PERSONAL FACTORS: Time since onset of injury/illness/exacerbation and 1-2 comorbidities: (Ca hx w XRT, HTN)  are also affecting patient's functional outcome.   REHAB POTENTIAL: Good    GOALS: Goals reviewed with patient?  Yes  SHORT TERM GOALS: Target date: 4th OT Rx visit  Pt will demonstrate understanding of lymphedema precautions and prevention strategies with modified independence using a printed reference to identify at least 5 precautions and discussing how s/he may implement them into daily life to reduce risk of progression with extra time. Baseline:Max A Goal status: 05/07/23 PROGRESSING- additional time needed to complete 06/10/23 GOAL MET  2.  Pt will be able to apply multilayer, thigh length, gradient, compression wraps to one leg at a time with modified assistance (extra time) to decrease limb volume, to limit infection risk, and to limit lymphedema progression.  Baseline: Max A Goal status: 05/07/23 GOAL MET   LONG TERM GOALS: Target date: 06/20/23  1.  Given this patient's Intake score of 8.82 % on the Lymphedema Life Impact Scale (LLIS), patient will experience a reduction of at least 5 points in her perceived level of functional impairment resulting from lymphedema to improve functional performance and quality of life (QOL). Baseline: 8.82% Goal status: 05/07/23 ONGOING  3.   During Intensive phase CDT Pt will achieve at least 85% compliance with all lymphedema self-care home program components, including daily skin care, compression wraps and /or garments, simple self MLD and lymphatic pumping therex to habituate LE self care protocol  into ADLs for  optimal LE self-management over time. Baseline: Max A Goal status: 05/07/23 GOAL MET  4.  Pt will achieve at least a 10% volume reduction in the LLE to return limb to typical size and shape, to limit infection risk and LE progression, to decrease pain, to improve function. Baseline: Dependent Goal status: 05/07/23 PROGRESSING 06/08/23: Minimal changes in RLE limb volumes at leg, thigh and full lower extremity. Volumes are very stable compared with initial and with current measurements.  5.  Pt will obtain proper, recommended compression  garments/devices and be modified independent with donning/doffing to optimize limb volume reduction and limit LE  progression over time. Baseline:  Goal status: 05/07/23 ONGOING 06/08/23 GOAL PROGRESSING- garments on order  PLAN:  PT FREQUENCY: Decrease from 2 x to 1 x weekly, and PRN, while awaiting garment fitting. They are on order. Flexitouch is also on order. Pt working with rep directly.  PT DURATION: 12 weeks and PRN  PLANNED INTERVENTIONS:  Complete Decongestive Therapy: Therapeutic lymphatic pumping exercises, Manual lymph drainage (MLD), Multilayer, gradient Compression bandaging, Manual therapy, skin care to limit infection risk, scar massage; During self management phase Pt will be fit with appropriate compression garments and devices,  Therapeutic activities Patient/Family education for LE Self Care home program  PLAN FOR NEXT SESSION:  RLE/RLQ MLD LLE knee length multilayer compression wraps Garment/ device fitting ASAP   Loel Dubonnet, MS, OTR/L, CLT-LANA 06/11/23 1:34 PM

## 2023-06-13 ENCOUNTER — Ambulatory Visit: Payer: Medicare PPO | Admitting: Occupational Therapy

## 2023-06-18 ENCOUNTER — Ambulatory Visit: Payer: Medicare PPO | Admitting: Occupational Therapy

## 2023-06-18 DIAGNOSIS — I890 Lymphedema, not elsewhere classified: Secondary | ICD-10-CM

## 2023-06-19 ENCOUNTER — Encounter: Payer: Self-pay | Admitting: Occupational Therapy

## 2023-06-19 NOTE — Therapy (Signed)
OUTPATIENT OCCUPATIONAL THERAPY TREATMENT NOTE  LOWER EXTREMITY LYMPHEDEMA  Patient Name: DONIE MOULTON MRN: [id number] DOB:December 28, 1945, 77 y.o., female Today's Date: 06/19/2023  REPORTING PERIOD: 05/07/23 - 06/08/23  END OF SESSION:   OT End of Session - 06/18/23 1007     Visit Number 22    Number of Visits 36    Date for OT Re-Evaluation 09/17/23    OT Start Time 1005    OT Stop Time 1105    OT Time Calculation (min) 60 min    Activity Tolerance Patient tolerated treatment well;No increased pain    Behavior During Therapy WFL for tasks assessed/performed                    Past Medical History:  Diagnosis Date   Arthritis    knee and hip,  per [facility] Patient [facility].    Bilateral bunions    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HCC)     per [facility] Patient Packet.    Colitis    H/O CT scan of chest 11/27/2018   By Roland Rack, per [facility] Patient Packet.    H/O mammogram 11/27/2018   By Pearlie Oyster at [facility], per [facility] New Patient Packet.    History of CT scan of abdomen 11/27/2018   By Roland Rack, per [facility] Patient Packet.    History of upper extremity x-ray 11/27/2018   By Lonny Prude, of Shoulder, per [facility] Patient Packet.    History of x-ray of hip 11/27/2018   By Lonny Prude, of Hip, per [facility] Patient Packet.    Hyperlipemia     per [facility] Patient Packet.    Hypertension     per [facility] Patient Packet.    Impingement syndrome of shoulder     per [facility] Patient Packet.    Incontinence    Mild Nighttime per [facility] New Patient Packet.   Insomnia    Melanoma (HCC)    right lower leg; right ovary   Osteoarthritis of multiple joints    Rosacea     per [facility] Patient Packet.    Sleep disorder    Past Surgical History:  Procedure Laterality Date   ABDOMINAL HYSTERECTOMY  11/28/1987   Partial per [facility] Packet   ARTHROSCOPIC REPAIR ACL Left 11/28/2003   Left Knee/ Torn Medial Meniscus per [facility] New Patient Packet    BREAST BIOPSY     CATARACT EXTRACTION W/ INTRAOCULAR LENS IMPLANT Bilateral    COLONOSCOPY  11/27/2016   By Liberty Handy,  per [facility] Patient Packet.    COLONOSCOPY  11/27/2017   By Liberty Handy, per [facility] Patient Packet.    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   Procedure: TOTAL KNEE ARTHROPLASTY;  Surgeon: Juanell Fairly, MD;  Location: [facility] ORS;  Service: Orthopedics;  Laterality: Left;   TUBAL LIGATION  1976   Patient Active Problem List   Diagnosis Date Noted   Chronic venous insufficiency 03/25/2023   Lymphedema 12/24/2022   S/P TKR (total knee replacement) using cement, left 12/20/2021   Sesamoiditis 05/06/2020   Hav (hallux abducto valgus), unspecified laterality  01/08/2020   Porokeratosis 01/08/2020   Insomnia    Rosacea    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HCC)    History of melanoma    Hypertension    Hyperlipemia    Osteoarthritis of multiple joints    Arthritis of knee, left 07/14/2016   Foot pain 03/17/2013   Melanoma of skin (HCC) 09/13/2011    PCP: Stann Mainland. Sampson Goon, MD  REFERRING PROVIDER: Sheppard Plumber, NP  REFERRING DIAG: I89.0  THERAPY DIAG:  Lymphedema, not elsewhere classified [I89.0]  Rationale for Evaluation and Treatment: Rehabilitation  ONSET DATE: Onset of RLE lymphedema s/p XRT for R melanoma recurrence in 2012  SUBJECTIVE:                                                                                                                                                                                           SUBJECTIVE STATEMENT:Susan Fischbach presents to  OT for Intensive Phase CDT to cancer-related  BLE lymphedema, R>L. R leg redness 2/2 venous stasis is  barely visible today. Pt denies LE-related pain. Pt reports she has reduced her bandaging routine and is only using short stretch wraps below the knee during HOS. At present she is wearing existing ccl 1     compression pantyhose for daytime after swimming. She hopeful that her Flexitouch device will be approved by insurance soon.   PERTINENT HISTORY:   relevant to LE: Takes NORVASC, HTN, L knee OA, hysterectomy, 1976 Tubal ligation, former smoker; CVI dx 2024  Oncology History 09/05/2011 Exploratory laparotomy and debulking of right pelvic sidewall mass.~ 2. Bilateral salpingo-oophorectomy.~ 3. Right pelvic lymph node sampling.~~~; Path: Right pelvic sidewall mass and right fallopian tube and ovary, excision; - Metastatic malignant melanoma involving matted lymph nodes with extracapsular; extension, size: 9.0 x 7.5 x 4.5 cm; ; Braf neg;  Stage III or stage IV melanoma (we cannot tell if this is a locally advanced stage III cancer or stage IV) of the right lower leg with metastasis to the right pelvic lymph nodes and ovaries.  Fall 2012: Radiation therapy L groin and LLQ  PAIN:  Are you having pain? No   PRECAUTIONS: Other: Lymphedema precautions; Hx melanoma  HAND DOMINANCE: right   PRIOR LEVEL OF FUNCTION: Independent  PATIENT GOALS: Learn how to best manage my lymphedema to keep it from getting worse   OBJECTIVE:  OBSERVATIONS / OTHER ASSESSMENTS:  Moderate, Stage  II, Cancer-related, Left Lower Extremity/ Left Lower Quadrant Lymphedema   Lymphedema Life Impact Scale (LLIS): 8.82% (The extent to which LE-related problems affected your life in the last week)  RLE COMPARATIVE LIMB VOLUMETRICS: 20 th Visit 06/08/23  [facility]  RIGHT    R LEG (A-D) 3615.2  ml  R THIGH (E-G) 5946.6 ml  R FULL LIMB (A-G) 9561.7 ml  Limb Volume differential (LVD)  %  Volume change since last measured on 03/29/23 R leg (A-D) DECREASED by 0.37% since last measured on 05/07/23. R thigh (E-G) INCREASED by  0.85 %, and  RLE full limb volume is INCREASED 0.4%.   Volume change since initial Since initially measured on 03/29/23 R LEG is decreased in volume by .89%. R thigh is reduced by 2.7%, and full R lower extremity is decreased in volume by 1.98% overall.     RLE COMPARATIVE LIMB VOLUMETRICS: 10 th Visit 05/07/23  [facility] RIGHT    R LEG (A-D) 3625.8  ml  R THIGH (E-G) 5896.1 ml  R FULL LIMB (A-G) 9522.0 ml  Limb Volume differential (LVD)  %  Volume change since last measured on 03/29/23 R leg (A-D) DECREASED by 0.5%. R thigh (E-G) DECREASED by 3.5%. RLE full limb volume is DECREASED 2.4%.   Volume change since initial   (Blank rows = not tested)  BLE COMPARATIVE LIMB VOLUMETRICS: Initial 03/29/23  [facility] RIGHT    R LEG (A-D) 3644.0 ml  R THIGH (E-G) 6111.3 ml  R FULL LIMB (A-G) 9755.2 ml  Limb Volume differential (LVD)  %  Volume change since initial %  Volume change overall V  (Blank rows = not tested)  [facility] LEFT   L LEG (A-D) 4076.7 ml  L THIGH (E-G) 6365.5 ml  L FULL LIMB (A-G) 10443.15 ml  Limb Volume differential (LVD)  LEG LVD= 12.9%, L>R Thigh LVD =4.2 %. L>R Full LIMB LVD = 7.05%, L>R  Volume change since initial %  Volume change overall %  (Blank rows = not tested)     TODAY'S TREATMENT:   No multilayer gradient compression wraps RLE comparative limb volumetrics  PATIENT EDUCATION: Continued Pt/ CG edu for lymphedema self care home program throughout session. Topics include outcome of comparative limb volumetrics- starting limb volume differentials (LVDs), technology and gradient techniques used for short stretch, multilayer compression wrapping, simple self-MLD, therapeutic lymphatic pumping exercises, skin/nail care, LE precautions,. compression garment recommendations and specifications, wear and care schedule and compression garment donning / doffing w assistive devices. Discussed progress towards all OT goals since commencing CDT. All questions answered to  the Pt's satisfaction. Good return.  Person educated: Patient Education method: Explanation, Demonstration, Verbal and Tactile cues, and Handouts Education comprehension: verbalized understanding, returned demonstration, verbal cues required, and needs further education  LYMPHEDEMA SELF-CARE HOME PROGRAM: all to be performed daily and ongoing as LE is a chronic, progressive condition with no cure RLE/RLQ Simple Self-Manual Lymphatic Drainage (MLD): In supine and side lying utilizing bilateral short neck sequence, deep abdominal pathways via diaphragmatic breathing, ipsilateral axillary inguinal  anastomosis, then inguinal LN, and dynamic J strokes from proximal to distal staring at groin and thigh, then knee, then leg segment, and finally  retrograde sweeps back to terminus. Skin care to limit infection risk and facilitate essential skin excursion or optimal lymphatic function Lymphatic Pumping there ex- 2 x daily, in sequence, 10 reps bilaterally. Hole 5 seconds each Multilayer compression wrapping: 1 each 8 and 10 cm wide short stretch bandage applied  using overlapping, gradient techniques starting at base of the and ending at popliteal fossa. Bandages applied over a single layer of 0.4 cm thick Rosidal foam and cotton stockinett underneath all. During Intensive Phase CDT patients undergo short stretch bandaging using gradient techniques one  limb at a time, toes to groin During Self-management phase of CDT Pt's utilize appropriate compression garments and HOS devices that meet their individual needs, abilities and lifestyle.  ccl 1 (20-30 mmHg), Juzo SOFT , size 3, SHORT pantyhose , circular knit, closed toe.  Custom-made gradient compression garments and HOS devices are medically necessary in this case because they are uniquely sized and shaped to fit the exact dimensions of the affected extremities with deformities, and to provide accurate and consistent gradient compression and containment,  essential to optimally managing this patient's symptoms of chronic, progressive lymphedema. Multiple custom compression garments are needed for optimal hygiene to limit infection risk. Custom compression garments should be replaced q 3-6 months When worn consistently for optimal lipo-lymphedema self-management over time.  ASSESSMENT: CLINICAL IMPRESSION: Pt tolerated MLD today without difficulty. She is performing simple self MLD neck sequence a bit fast, but was able to slow down to correct speed for repetitions after skilled teaching . Cont as per POC 1 x w until fitting is complete.  OBJECTIVE IMPAIRMENTS: decreased knowledge of condition, decreased knowledge of use of DME, chronic progressive BLE swelling, R>L , LE associated pain and skin changes,   ACTIVITY LIMITATIONS: gravity dependent sitting, standing and extended walking exacerbate limb swelling and associated pain limiting functional ambulation and mobility; limb swelling limits Pt's ability to fit street shoes and socks; BLE LE and pain limits social participation; LE limits body image, and  "gets me down" causing feelings of anger, depression or frustration.   PARTICIPATION LIMITATIONS: painting hobby, social participation in community activities requiring extended sitting, standing, walking  PERSONAL FACTORS: Time since onset of injury/illness/exacerbation and 1-2 comorbidities: (Ca hx w XRT, HTN)  are also affecting patient's functional outcome.   REHAB POTENTIAL: Good    GOALS: Goals reviewed with patient? Yes  SHORT TERM GOALS: Target date: 4th OT Rx visit  Pt will demonstrate understanding of lymphedema precautions and prevention strategies with modified independence using a printed reference to identify at least 5 precautions and discussing how s/he may implement them into daily life to reduce risk of progression with extra time. Baseline:Max A Goal status: 05/07/23 PROGRESSING- additional time needed to complete 06/10/23  GOAL MET  2.  Pt will be able to apply multilayer, thigh length, gradient, compression wraps to one leg at a time with modified assistance (extra time) to decrease limb volume, to limit infection risk, and to limit lymphedema progression.  Baseline: Max A Goal status: 05/07/23 GOAL MET   LONG TERM GOALS: Target date: 06/20/23  1.  Given this patient's Intake score of 8.82 % on the Lymphedema Life Impact Scale (LLIS), patient will experience a reduction of at least 5 points in her perceived level of functional impairment resulting from lymphedema to improve functional performance and quality of life (QOL). Baseline: 8.82% Goal status: 05/07/23 ONGOING  3.   During Intensive phase CDT Pt will achieve at least 85% compliance with all lymphedema self-care home program components, including daily skin care, compression wraps and /or garments, simple self MLD and lymphatic pumping therex to habituate LE self care protocol  into ADLs for optimal LE self-management over time. Baseline: Max A Goal status: 05/07/23 GOAL MET  4.  Pt will achieve at least a 10% volume reduction in the LLE to return limb to typical size and shape, to limit infection risk and LE progression, to decrease pain, to improve function. Baseline: Dependent Goal status: 05/07/23 PROGRESSING 06/08/23: Minimal changes in RLE limb volumes at leg, thigh  and full lower extremity. Volumes are very stable compared with initial and with current measurements.  5.  Pt will obtain proper, recommended compression garments/devices and be modified independent with donning/doffing to optimize limb volume reduction and limit LE  progression over time. Baseline:  Goal status: 05/07/23 ONGOING 06/08/23 GOAL PROGRESSING- garments on order  PLAN:  PT FREQUENCY: Decrease from 2 x to 1 x weekly, and PRN, while awaiting garment fitting. They are on order. Flexitouch is also on order. Pt working with rep directly.  PT DURATION: 12 weeks and PRN  PLANNED  INTERVENTIONS:  Complete Decongestive Therapy: Therapeutic lymphatic pumping exercises, Manual lymph drainage (MLD), Multilayer, gradient Compression bandaging, Manual therapy, skin care to limit infection risk, scar massage; During self management phase Pt will be fit with appropriate compression garments and devices,  Therapeutic activities Patient/Family education for LE Self Care home program  PLAN FOR NEXT SESSION:  RLE/RLQ MLD LLE knee length multilayer compression wraps Garment/ device fitting ASAP   Loel Dubonnet, MS, OTR/L, CLT-LANA 06/19/23 12:48 PM

## 2023-06-20 ENCOUNTER — Ambulatory Visit: Payer: Medicare PPO | Admitting: Occupational Therapy

## 2023-06-25 ENCOUNTER — Ambulatory Visit: Payer: Medicare PPO | Admitting: Occupational Therapy

## 2023-06-25 ENCOUNTER — Encounter: Payer: Self-pay | Admitting: Occupational Therapy

## 2023-06-25 DIAGNOSIS — I890 Lymphedema, not elsewhere classified: Secondary | ICD-10-CM

## 2023-06-25 NOTE — Therapy (Signed)
OUTPATIENT OCCUPATIONAL THERAPY TREATMENT NOTE  LOWER EXTREMITY LYMPHEDEMA  Patient Name: Barbara Burgess MRN: [id number] DOB:07-19-1946, 77 y.o., female Today's Date: 06/25/2023    END OF SESSION:   OT End of Session - 06/25/23 1015     Visit Number 23    Number of Visits 36    Date for OT Re-Evaluation 09/17/23    OT Start Time 1005    OT Stop Time 1115    OT Time Calculation (min) 70 min    Activity Tolerance Patient tolerated treatment well;No increased pain    Behavior During Therapy WFL for tasks assessed/performed                    Past Medical History:  Diagnosis Date   Arthritis    knee and hip,  per [facility] Patient [facility].    Bilateral bunions    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HCC)     per [facility] Patient Packet.    Colitis    H/O CT scan of chest 11/27/2018   By Roland Rack, per [facility] New Patient Packet.    H/O mammogram 11/27/2018   By Pearlie Oyster at [facility], per [facility] New Patient Packet.    History of CT scan of abdomen 11/27/2018   By Roland Rack, per [facility] Patient Packet.    History of upper extremity x-ray 11/27/2018   By Lonny Prude, of Shoulder, per [facility] New Patient Packet.    History of x-ray of hip 11/27/2018   By Lonny Prude, of Hip, per [facility] New Patient Packet.    Hyperlipemia     per [facility] Patient Packet.    Hypertension     per [facility] Patient Packet.    Impingement syndrome of shoulder     per [facility] Patient Packet.    Incontinence    Mild Nighttime per [facility] New Patient Packet.   Insomnia    Melanoma (HCC)    right lower leg; right ovary   Osteoarthritis of multiple joints    Rosacea     per [facility] New Patient Packet.    Sleep disorder    Past Surgical History:  Procedure Laterality Date   ABDOMINAL HYSTERECTOMY  11/28/1987   Partial per [facility] Patient Packet   ARTHROSCOPIC REPAIR ACL Left 11/28/2003   Left Knee/ Torn Medial Meniscus per [facility] New Patient Packet   BREAST BIOPSY     CATARACT EXTRACTION  W/ INTRAOCULAR LENS IMPLANT Bilateral    COLONOSCOPY  11/27/2016   By Liberty Handy,  per [facility] Patient Packet.    COLONOSCOPY  11/27/2017   By Liberty Handy, per [facility] Patient Packet.    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   Procedure: TOTAL KNEE ARTHROPLASTY;  Surgeon: Juanell Fairly, MD;  Location: [facility] ORS;  Service: Orthopedics;  Laterality: Left;   TUBAL LIGATION  1976   Patient Active Problem List   Diagnosis Date Noted   Chronic venous insufficiency 03/25/2023   Lymphedema 12/24/2022   S/P TKR (total knee replacement) using cement, left 12/20/2021   Sesamoiditis 05/06/2020   Hav (hallux abducto valgus), unspecified laterality 01/08/2020   Porokeratosis  01/08/2020   Insomnia    Rosacea    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HCC)    History of melanoma    Hypertension    Hyperlipemia    Osteoarthritis of multiple joints    Arthritis of knee, left 07/14/2016   Foot pain 03/17/2013   Melanoma of skin (HCC) 09/13/2011    PCP: Stann Mainland. Sampson Goon, MD  REFERRING PROVIDER: Sheppard Plumber, NP  REFERRING DIAG: I89.0  THERAPY DIAG:  Lymphedema, not elsewhere classified [I89.0]  Rationale for Evaluation and Treatment: Rehabilitation  ONSET DATE: Onset of RLE lymphedema s/p XRT for R melanoma recurrence in 2012  SUBJECTIVE:                                                                                                                                                                                           SUBJECTIVE STATEMENT:Barbara Burgess presents to  OT for Intensive Phase CDT to cancer-related  BLE lymphedema, R>L. R leg redness 2/2 venous stasis is resolved. Pt denies LE-related pain.  Pt c/o soreness in R thumb Meta carpal joint when gripping and pinching. Pt excited that OTS Juzo pantyhose have arrived from DME vendor.  PERTINENT HISTORY:   relevant to LE: Takes NORVASC, HTN, L knee OA, hysterectomy, 1976 Tubal ligation, former smoker; CVI dx 2024  Oncology History 09/05/2011 Exploratory laparotomy and debulking of right pelvic sidewall mass.~ 2. Bilateral salpingo-oophorectomy.~ 3. Right pelvic lymph node sampling.~~~; Path: Right pelvic sidewall mass and right fallopian tube and ovary, excision; - Metastatic malignant melanoma involving matted lymph nodes with extracapsular; extension, size: 9.0 x 7.5 x 4.5 cm; ; Braf neg;  Stage III or stage IV melanoma (we cannot tell if this is a locally advanced stage III cancer or stage IV) of the right lower leg with metastasis to the right pelvic lymph nodes and ovaries.  Fall 2012: Radiation therapy L groin and LLQ  PAIN:  Are you having pain? No   PRECAUTIONS: Other: Lymphedema precautions; Hx melanoma  HAND DOMINANCE: right   PRIOR LEVEL OF FUNCTION: Independent  PATIENT GOALS: Learn how to best manage my lymphedema to keep it from getting worse   OBJECTIVE:  OBSERVATIONS / OTHER ASSESSMENTS:  Moderate, Stage  II, Cancer-related, Left Lower Extremity/ Left Lower Quadrant Lymphedema   Lymphedema Life Impact Scale (LLIS): 8.82% (The extent to which LE-related problems affected your life in the last week)  RLE COMPARATIVE LIMB VOLUMETRICS: 20 th Visit 06/08/23  [facility] RIGHT    R LEG (A-D) 3615.2  ml  R THIGH (E-G) 5946.6 ml  R FULL LIMB (A-G) 9561.7 ml  Limb Volume differential (LVD)  %  Volume change  since last measured on 03/29/23 R leg (A-D) DECREASED by 0.37% since last measured on 05/07/23. R thigh (E-G) INCREASED by 0.85 %, and  RLE full limb volume is INCREASED 0.4%.   Volume change since initial Since initially measured on 03/29/23 R LEG is decreased in volume by .89%. R thigh is reduced by 2.7%, and full R  lower extremity is decreased in volume by 1.98% overall.     RLE COMPARATIVE LIMB VOLUMETRICS: 10 th Visit 05/07/23  [facility] RIGHT    R LEG (A-D) 3625.8  ml  R THIGH (E-G) 5896.1 ml  R FULL LIMB (A-G) 9522.0 ml  Limb Volume differential (LVD)  %  Volume change since last measured on 03/29/23 R leg (A-D) DECREASED by 0.5%. R thigh (E-G) DECREASED by 3.5%. RLE full limb volume is DECREASED 2.4%.   Volume change since initial   (Blank rows = not tested)  BLE COMPARATIVE LIMB VOLUMETRICS: Initial 03/29/23  [facility] RIGHT    R LEG (A-D) 3644.0 ml  R THIGH (E-G) 6111.3 ml  R FULL LIMB (A-G) 9755.2 ml  Limb Volume differential (LVD)  %  Volume change since initial %  Volume change overall V  (Blank rows = not tested)  [facility] LEFT   L LEG (A-D) 4076.7 ml  L THIGH (E-G) 6365.5 ml  L FULL LIMB (A-G) 10443.15 ml  Limb Volume differential (LVD)  LEG LVD= 12.9%, L>R Thigh LVD =4.2 %. L>R Full LIMB LVD = 7.05%, L>R  Volume change since initial %  Volume change overall %  (Blank rows = not tested)     TODAY'S TREATMENT:   No multilayer gradient compression wraps RLE comparative limb volumetrics  PATIENT EDUCATION: Continued Pt/ CG edu for lymphedema self care home program throughout session. Topics include outcome of comparative limb volumetrics- starting limb volume differentials (LVDs), technology and gradient techniques used for short stretch, multilayer compression wrapping, simple self-MLD, therapeutic lymphatic pumping exercises, skin/nail care, LE precautions,. compression garment recommendations and specifications, wear and care schedule and compression garment donning / doffing w assistive devices. Discussed progress towards all OT goals since commencing CDT. All questions answered to the Pt's satisfaction. Good return.   Pt edu re finger and thumb joint protection techniques and assistive devices, including pencil grips for art work, adapted ball point pen , alternative grasp  and grip patterns, adapted scissors with spring open handles. Demonstrated Kinesiotape to thumb metacarpal and comfort cool soft neoprene thumb joint support.  Person educated: Patient Education method: Explanation, Demonstration, Verbal and Tactile cues, and Handouts Education comprehension: verbalized understanding, returned demonstration, verbal cues required, and needs further education  LYMPHEDEMA SELF-CARE HOME PROGRAM: all to be performed daily and ongoing as LE is a chronic, progressive condition with no cure RLE/RLQ Simple Self-Manual Lymphatic Drainage (MLD): In supine and side lying utilizing bilateral short neck sequence, deep abdominal pathways via diaphragmatic breathing, ipsilateral axillary inguinal  anastomosis, then inguinal LN, and dynamic J strokes from proximal to distal staring at groin and thigh, then knee, then leg segment, and finally  retrograde sweeps back to terminus. Skin care to limit infection risk and facilitate essential skin excursion or optimal lymphatic function Lymphatic Pumping there ex- 2 x daily, in sequence, 10 reps bilaterally. Hole 5 seconds each Multilayer compression wrapping: 1 each 8 and 10 cm wide short stretch bandage applied  using overlapping, gradient techniques starting at base of the and ending at popliteal fossa. Bandages applied over a single layer of 0.4 cm thick Rosidal foam and cotton  stockinett underneath all. During Intensive Phase CDT patients undergo short stretch bandaging using gradient techniques one limb at a time, toes to groin During Self-management phase of CDT Pt's utilize appropriate compression garments and HOS devices that meet their individual needs, abilities and lifestyle.  ccl 1 (20-30 mmHg), Juzo SOFT , size 3, SHORT pantyhose , circular knit, closed toe.  Custom-made gradient compression garments and HOS devices are medically necessary in this case because they are uniquely sized and shaped to fit the exact dimensions of  the affected extremities with deformities, and to provide accurate and consistent gradient compression and containment, essential to optimally managing this patient's symptoms of chronic, progressive lymphedema. Multiple custom compression garments are needed for optimal hygiene to limit infection risk. Custom compression garments should be replaced q 3-6 months When worn consistently for optimal lipo-lymphedema self-management over time.  ASSESSMENT: CLINICAL IMPRESSION: Pt tolerated MLD to RLE/RLQ as established today without difficulty. Completed initial fitting of ccl 1, Juzo soft, size III pantyhose. Garments fit very well and Pt is able to don in a timely way using assistive devices  (friction gloves.). Provided Pt edu  for joint protection when donning and doffing compression garments, and when performing daily activities in general. Provided 2 different pen/ pencil grips to reduce force on thumb joints when making artwork or when writing. Pt already uses friction gloves at home for donning and doffing compression stockings. Demonstrated how to wrap the thumb with Kinesiotape for support and introduced comfort cool soft, flexible , neoprene splint for thumb support for many tasks. OT will request remaining pairs of compression garments be ordered   and also ask vendor to check the status of the [employer] H[employer] garment.  OBJECTIVE IMPAIRMENTS: decreased knowledge of condition, decreased knowledge of use of DME, chronic progressive BLE swelling, R>L , LE associated pain and skin changes,   ACTIVITY LIMITATIONS: gravity dependent sitting, standing and extended walking exacerbate limb swelling and associated pain limiting functional ambulation and mobility; limb swelling limits Pt's ability to fit street shoes and socks; BLE LE and pain limits social participation; LE limits body image, and  "gets me down" causing feelings of anger, depression or frustration.   PARTICIPATION LIMITATIONS: painting hobby,  social participation in community activities requiring extended sitting, standing, walking  PERSONAL FACTORS: Time since onset of injury/illness/exacerbation and 1-2 comorbidities: (Ca hx w XRT, HTN)  are also affecting patient's functional outcome.   REHAB POTENTIAL: Good    GOALS: Goals reviewed with patient? Yes  SHORT TERM GOALS: Target date: 4th OT Rx visit  Pt will demonstrate understanding of lymphedema precautions and prevention strategies with modified independence using a printed reference to identify at least 5 precautions and discussing how s/he may implement them into daily life to reduce risk of progression with extra time. Baseline:Max A Goal status: 05/07/23 PROGRESSING- additional time needed to complete 06/10/23 GOAL MET  2.  Pt will be able to apply multilayer, thigh length, gradient, compression wraps to one leg at a time with modified assistance (extra time) to decrease limb volume, to limit infection risk, and to limit lymphedema progression.  Baseline: Max A Goal status: 05/07/23 GOAL MET   LONG TERM GOALS: Target date: 06/20/23  1.  Given this patient's Intake score of 8.82 % on the Lymphedema Life Impact Scale (LLIS), patient will experience a reduction of at least 5 points in her perceived level of functional impairment resulting from lymphedema to improve functional performance and quality of life (QOL). Baseline: 8.82% Goal status:  05/07/23 ONGOING  3.   During Intensive phase CDT Pt will achieve at least 85% compliance with all lymphedema self-care home program components, including daily skin care, compression wraps and /or garments, simple self MLD and lymphatic pumping therex to habituate LE self care protocol  into ADLs for optimal LE self-management over time. Baseline: Max A Goal status: 05/07/23 GOAL MET  4.  Pt will achieve at least a 10% volume reduction in the LLE to return limb to typical size and shape, to limit infection risk and LE progression, to  decrease pain, to improve function. Baseline: Dependent Goal status: 05/07/23 PROGRESSING 06/08/23: Minimal changes in RLE limb volumes at leg, thigh and full lower extremity. Volumes are very stable compared with initial and with current measurements.  5.  Pt will obtain proper, recommended compression garments/devices and be modified independent with donning/doffing to optimize limb volume reduction and limit LE  progression over time. Baseline:  Goal status: 05/07/23 ONGOING 06/08/23 GOAL PROGRESSING- garments on order 06/25/23 GOAL MET  PLAN:  PT FREQUENCY: Decrease from 2 x to 1 x weekly, and PRN, while awaiting garment fitting. They are on order. Flexitouch is also on order. Pt working with rep directly.  PT DURATION: 12 weeks and PRN  PLANNED INTERVENTIONS:  Complete Decongestive Therapy: Therapeutic lymphatic pumping exercises, Manual lymph drainage (MLD), Multilayer, gradient Compression bandaging, Manual therapy, skin care to limit infection risk, scar massage; During self management phase Pt will be fit with appropriate compression garments and devices,  Therapeutic activities Patient/Family education for LE Self Care home program  PLAN FOR NEXT SESSION:  RLE/RLQ MLD LLE knee length multilayer compression wraps Garment/ device fitting ASAP   Loel Dubonnet, MS, OTR/L, CLT-LANA 06/25/23 11:36 AM

## 2023-06-27 ENCOUNTER — Ambulatory Visit: Payer: Medicare PPO | Admitting: Occupational Therapy

## 2023-07-02 ENCOUNTER — Ambulatory Visit: Payer: Medicare PPO | Attending: Nurse Practitioner | Admitting: Occupational Therapy

## 2023-07-02 ENCOUNTER — Encounter: Payer: Self-pay | Admitting: Occupational Therapy

## 2023-07-02 DIAGNOSIS — I890 Lymphedema, not elsewhere classified: Secondary | ICD-10-CM | POA: Diagnosis present

## 2023-07-02 NOTE — Therapy (Signed)
OUTPATIENT OCCUPATIONAL THERAPY TREATMENT NOTE and DISCHARGE SUMMARY  LOWER EXTREMITY LYMPHEDEMA  Patient Name: Barbara Burgess MRN: [id number] DOB:[birth year], 77 y.o., female Today's Date: 07/02/2023    END OF SESSION:   OT End of Session - 07/02/23 1150     Visit Number 24    Number of Visits 36    Date for OT Re-Evaluation 09/17/23    OT Start Time 1005    OT Stop Time 1105    OT Time Calculation (min) 60 min    Activity Tolerance Patient tolerated treatment well;No increased pain    Behavior During Therapy WFL for tasks assessed/performed                    Past Medical History:  Diagnosis Date   Arthritis    knee and hip,  per [facility].    Bilateral bunions    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HCC)     per [facility].    Colitis    H/O CT scan of chest 11/27/2018   By Roland Rack, per [facility].    H/O mammogram 11/27/2018   By Pearlie Oyster at [facility], per [facility] Patient Packet.    History of CT scan of abdomen 11/27/2018   By Roland Rack, per [facility] Packet.    History of upper extremity x-ray 11/27/2018   By Lonny Prude, of Shoulder, per [facility] Packet.    History of x-ray of hip 11/27/2018   By Lonny Prude, of Hip, per [facility] Patient Packet.    Hyperlipemia     per [facility].    Hypertension     per [facility].    Impingement syndrome of shoulder     per [facility] Packet.    Incontinence    Mild Nighttime per [facility] Patient Packet.   Insomnia    Melanoma (HCC)    right lower leg; right ovary   Osteoarthritis of multiple joints    Rosacea     per [facility] Patient Packet.    Sleep disorder    Past Surgical History:  Procedure Laterality Date   ABDOMINAL HYSTERECTOMY  11/28/1987   Partial per [facility]   ARTHROSCOPIC REPAIR ACL Left 11/28/2003   Left Knee/ Torn Medial Meniscus per [facility] New Patient Packet   BREAST BIOPSY      CATARACT EXTRACTION W/ INTRAOCULAR LENS IMPLANT Bilateral    COLONOSCOPY  11/27/2016   By Liberty Handy,  per [facility].    COLONOSCOPY  11/27/2017   By Liberty Handy, per [facility] Patient Packet.    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   Procedure: TOTAL KNEE ARTHROPLASTY;  Surgeon: Juanell Fairly, MD;  Location: [facility] ORS;  Service: Orthopedics;  Laterality: Left;   TUBAL LIGATION  1976   Patient Active Problem List   Diagnosis Date Noted   Chronic venous insufficiency 03/25/2023   Lymphedema 12/24/2022   S/P TKR (total knee replacement) using cement, left 12/20/2021   Sesamoiditis 05/06/2020   Hav (hallux abducto valgus), unspecified laterality 01/08/2020  Porokeratosis 01/08/2020   Insomnia    Rosacea    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HCC)    History of melanoma    Hypertension    Hyperlipemia    Osteoarthritis of multiple joints    Arthritis of knee, left 07/14/2016   Foot pain 03/17/2013   Melanoma of skin (HCC) 09/13/2011    PCP: Stann Mainland. Sampson Goon, MD  REFERRING PROVIDER: Sheppard Plumber, NP  REFERRING DIAG: I89.0  THERAPY DIAG:  Lymphedema, not elsewhere classified [I89.0]  Rationale for Evaluation and Treatment: Rehabilitation  ONSET DATE: Onset of RLE lymphedema s/p XRT for R melanoma recurrence in 2012  SUBJECTIVE:                                                                                                                                                                                           SUBJECTIVE STATEMENT:Barbara Burgess presents to  OT for Intensive Phase CDT to cancer-related  BLE lymphedema, R>L. R leg redness 2/2 venous stasis is nearly resolved.  Pt denies LE-related pain. Pt reports she ordered a Comfort Cool flexible, neoprene, thumb CMC support. OT encouraged her to use it when donning and doffing compression garments for joint protection  and to ease pain. Pt excited that remaining compression device arrived today for fitting from DME vendor.  PERTINENT HISTORY:   relevant to LE: Takes NORVASC, HTN, L knee OA, hysterectomy, 1976 Tubal ligation, former smoker; CVI dx 2024  Oncology History 09/05/2011 Exploratory laparotomy and debulking of right pelvic sidewall mass.~ 2. Bilateral salpingo-oophorectomy.~ 3. Right pelvic lymph node sampling.~~~; Path: Right pelvic sidewall mass and right fallopian tube and ovary, excision; - Metastatic malignant melanoma involving matted lymph nodes with extracapsular; extension, size: 9.0 x 7.5 x 4.5 cm; ; Braf neg;  Stage III or stage IV melanoma (we cannot tell if this is a locally advanced stage III cancer or stage IV) of the right lower leg with metastasis to the right pelvic lymph nodes and ovaries.  Fall 2012: Radiation therapy L groin and LLQ  PAIN:  Are you having pain? No   PRECAUTIONS: Other: Lymphedema precautions; Hx melanoma  HAND DOMINANCE: right   PRIOR LEVEL OF FUNCTION: Independent  PATIENT GOALS: Learn how to best manage my lymphedema to keep it from getting worse   OBJECTIVE:  OBSERVATIONS / OTHER ASSESSMENTS:  Moderate, Stage  II, Cancer-related, Left Lower Extremity/ Left Lower Quadrant Lymphedema   Lymphedema Life Impact Scale (LLIS): 8.82% (The extent to which LE-related problems affected your life in the last week)  RLE COMPARATIVE LIMB VOLUMETRICS: 20 th Visit 06/08/23  [facility] RIGHT    R LEG (A-D) 3615.2  ml  R THIGH (E-G) 5946.6 ml  R FULL LIMB (A-G) 9561.7 ml  Limb Volume differential (LVD)  %  Volume change since last measured on 03/29/23 R leg (A-D) DECREASED by 0.37% since last measured on 05/07/23. R thigh (E-G) INCREASED by 0.85 %, and  RLE full limb  volume is INCREASED 0.4%.   Volume change since initial Since initially measured on 03/29/23 R LEG is decreased in volume by .89%. R thigh is reduced by 2.7%, and full R lower extremity is decreased in volume by 1.98% overall.     RLE COMPARATIVE LIMB VOLUMETRICS: 10 th Visit 05/07/23  [facility] RIGHT    R LEG (A-D) 3625.8  ml  R THIGH (E-G) 5896.1 ml  R FULL LIMB (A-G) 9522.0 ml  Limb Volume differential (LVD)  %  Volume change since last measured on 03/29/23 R leg (A-D) DECREASED by 0.5%. R thigh (E-G) DECREASED by 3.5%. RLE full limb volume is DECREASED 2.4%.   Volume change since initial   (Blank rows = not tested)  BLE COMPARATIVE LIMB VOLUMETRICS: Initial 03/29/23  [facility] RIGHT    R LEG (A-D) 3644.0 ml  R THIGH (E-G) 6111.3 ml  R FULL LIMB (A-G) 9755.2 ml  Limb Volume differential (LVD)  %  Volume change since initial %  Volume change overall V  (Blank rows = not tested)  [facility] LEFT   L LEG (A-D) 4076.7 ml  L THIGH (E-G) 6365.5 ml  L FULL LIMB (A-G) 10443.15 ml  Limb Volume differential (LVD)  LEG LVD= 12.9%, L>R Thigh LVD =4.2 %. L>R Full LIMB LVD = 7.05%, L>R  Volume change since initial %  Volume change overall %  (Blank rows = not tested)     TODAY'S TREATMENT:   No multilayer gradient compression wraps RLE comparative limb volumetrics  PATIENT EDUCATION: Continued Pt/ CG edu for lymphedema self care home program throughout session. Topics include outcome of comparative limb volumetrics- starting limb volume differentials (LVDs), technology and gradient techniques used for short stretch, multilayer compression wrapping, simple self-MLD, therapeutic lymphatic pumping exercises, skin/nail care, LE precautions,. compression garment recommendations and specifications, wear and care schedule and compression garment donning / doffing w assistive devices. Discussed progress towards all OT goals since commencing CDT. All questions answered to the Pt's satisfaction. Good  return.   Pt edu re finger and thumb joint protection techniques and assistive devices, including pencil grips for art work, adapted ball point pen , alternative grasp and grip patterns, adapted scissors with spring open handles. Demonstrated Kinesiotape to thumb metacarpal and comfort cool soft neoprene thumb joint support.  Person educated: Patient Education method: Explanation, Demonstration, Verbal and Tactile cues, and Handouts Education comprehension: verbalized understanding, returned demonstration, verbal cues required, and needs further education  LYMPHEDEMA SELF-CARE HOME PROGRAM: all to be performed daily and ongoing as LE is a chronic, progressive condition with no cure RLE/RLQ Simple Self-Manual Lymphatic Drainage (MLD): In supine and side lying utilizing bilateral short neck sequence, deep abdominal pathways via diaphragmatic breathing, ipsilateral axillary inguinal  anastomosis, then inguinal LN, and dynamic J strokes from proximal to distal staring at groin and thigh, then knee, then leg segment, and finally  retrograde sweeps back to terminus. Skin care to limit infection risk and facilitate essential skin excursion or optimal lymphatic function Lymphatic Pumping there ex- 2 x daily, in sequence, 10 reps bilaterally. Hole 5 seconds each Multilayer compression wrapping: 1 each 8 and 10 cm wide short stretch bandage applied  using overlapping, gradient techniques starting at  base of the and ending at popliteal fossa. Bandages applied over a single layer of 0.4 cm thick Rosidal foam and cotton stockinett underneath all. During Intensive Phase CDT patients undergo short stretch bandaging using gradient techniques one limb at a time, toes to groin During Self-management phase of CDT Pt's utilize appropriate compression garments and HOS devices that meet their individual needs, abilities and lifestyle.  ccl 1 (20-30 mmHg), Juzo SOFT , size 3, SHORT pantyhose , circular knit, closed  toe.  Custom-made gradient compression garments and HOS devices are medically necessary in this case because they are uniquely sized and shaped to fit the exact dimensions of the affected extremities with deformities, and to provide accurate and consistent gradient compression and containment, essential to optimally managing this patient's symptoms of chronic, progressive lymphedema. Multiple custom compression garments are needed for optimal hygiene to limit infection risk. Custom compression garments should be replaced q 3-6 months When worn consistently for optimal lipo-lymphedema self-management over time.  ASSESSMENT: CLINICAL IMPRESSION: Pt tolerated MLD to RLE/RLQ as established today without difficulty. Completed initial fitting of custom, ccl 2, Jobst, A-D, HOS device, designed to limit fibrosis formation and facilitate improved lymphatic circulation during hours of sleep. Garment fit is excellent and Pt is able to don and doff it independently. OT recommends Pt use comfort cool thumb CMC support when donning and doffing compression garments as joint protection strategy due to painful CMC arthritis. Pt given enlarged, soft rubber pencil grip for colored art pencils at previous session. Pt agrees that all OT goals for lymphedema care are met, except for volume reduction goal. R limb volume was stable throughout therapy with minimal change, despite flare up of venous stasis. Pt obtained all recommended replacement compression garments and devices. She's proficient and 100% compliant with all lymphedema self care home program components ( self MLD, skin care , there ex and compression), she elevates when seated, and sustains an active lifestyle with swimming daily. Pt is,  hopefully, in the process of obtaining an advanced Flexitouch device with help from [employer] rep. This device is an excellent assistive device for LE self-management, but this OT no longer works with [employer] the patient agrees  with plan to call with future questions or concerns as we transition to DC from OT today. I have enjoyed working with Lyndle Herrlich and wish her well.  OBJECTIVE IMPAIRMENTS: decreased knowledge of condition, decreased knowledge of use of DME, chronic progressive BLE swelling, R>L , LE associated pain and skin changes,   ACTIVITY LIMITATIONS: gravity dependent sitting, standing and extended walking exacerbate limb swelling and associated pain limiting functional ambulation and mobility; limb swelling limits Pt's ability to fit street shoes and socks; BLE LE and pain limits social participation; LE limits body image, and  "gets me down" causing feelings of anger, depression or frustration.   PARTICIPATION LIMITATIONS: painting hobby, social participation in community activities requiring extended sitting, standing, walking  PERSONAL FACTORS: Time since onset of injury/illness/exacerbation and 1-2 comorbidities: (Ca hx w XRT, HTN)  are also affecting patient's functional outcome.   REHAB POTENTIAL: Good    GOALS: Goals reviewed with patient? Yes  SHORT TERM GOALS: Target date: 4th OT Rx visit  Pt will demonstrate understanding of lymphedema precautions and prevention strategies with modified independence using a printed reference to identify at least 5 precautions and discussing how s/he may implement them into daily life to reduce risk of progression with extra time. Baseline:Max A Goal status: 05/07/23 PROGRESSING- additional time needed to  complete 06/10/23 GOAL MET  2.  Pt will be able to apply multilayer, thigh length, gradient, compression wraps to one leg at a time with modified assistance (extra time) to decrease limb volume, to limit infection risk, and to limit lymphedema progression.  Baseline: Max A Goal status: 05/07/23 GOAL MET   LONG TERM GOALS: Target date: 06/20/23  1.  Given this patient's Intake score of 8.82 % on the Lymphedema Life Impact Scale (LLIS), patient will  experience a reduction of at least 5 points in her perceived level of functional impairment resulting from lymphedema to improve functional performance and quality of life (QOL). Baseline: 8.82% Goal status: 07/02/23 8.82% NO CHANGE  3.   During Intensive phase CDT Pt will achieve at least 85% compliance with all lymphedema self-care home program components, including daily skin care, compression wraps and /or garments, simple self MLD and lymphatic pumping therex to habituate LE self care protocol  into ADLs for optimal LE self-management over time. Baseline: Max A Goal status: 05/07/23 GOAL MET  4.  Pt will achieve at least a 10% volume reduction in the LLE to return limb to typical size and shape, to limit infection risk and LE progression, to decrease pain, to improve function. Baseline: Dependent Goal status: 05/07/23 PROGRESSING 06/08/23: Minimal changes in RLE limb volumes at leg, thigh and full lower extremity. Volumes are very stable compared with initial and with current measurements. GOAL DISCHARGED.  5.  Pt will obtain proper, recommended compression garments/devices and be modified independent with donning/doffing to optimize limb volume reduction and limit LE  progression over time. Baseline:  Goal status: 05/07/23 ONGOING 06/08/23 GOAL PROGRESSING- garments on order 06/25/23 GOAL MET. 07/02/23 Pt able to don and doff independently  PLAN:  PT FREQUENCY: Decrease from 2 x to 1 x weekly, and PRN, while awaiting garment fitting. They are on order. Flexitouch is also on order. Pt working with rep directly.  PT DURATION: 12 weeks and PRN  PLANNED INTERVENTIONS:  Complete Decongestive Therapy: Therapeutic lymphatic pumping exercises, Manual lymph drainage (MLD), Multilayer, gradient Compression bandaging, Manual therapy, skin care to limit infection risk, scar massage; During self management phase Pt will be fit with appropriate compression garments and devices,  Therapeutic  activities Patient/Family education for LE Self Care home program  PLAN FOR NEXT SESSION:  Pt DC from OT for LE care this date. Pt will call PRN.    Loel Dubonnet, MS, OTR/L, CLT-LANA 07/02/23 11:54 AM

## 2023-07-04 ENCOUNTER — Ambulatory Visit: Payer: Medicare PPO | Admitting: Occupational Therapy

## 2023-07-09 ENCOUNTER — Ambulatory Visit: Payer: Medicare PPO | Admitting: Occupational Therapy

## 2023-07-11 ENCOUNTER — Ambulatory Visit: Payer: Medicare PPO | Admitting: Occupational Therapy

## 2023-07-16 ENCOUNTER — Encounter: Payer: Medicare PPO | Admitting: Occupational Therapy

## 2023-07-18 ENCOUNTER — Encounter: Payer: Medicare PPO | Admitting: Occupational Therapy

## 2023-07-25 ENCOUNTER — Encounter: Payer: Medicare PPO | Admitting: Occupational Therapy

## 2023-07-31 ENCOUNTER — Encounter: Payer: Medicare PPO | Admitting: Occupational Therapy

## 2023-08-02 ENCOUNTER — Telehealth: Payer: Self-pay | Admitting: Occupational Therapy

## 2023-08-02 ENCOUNTER — Encounter: Payer: Medicare PPO | Admitting: Occupational Therapy

## 2023-08-02 NOTE — Telephone Encounter (Signed)
LVM that we have the stockings and they are closed toe. I did let her know the phone number for Nathaneil Canary to call them directly. I let her know she can pick them up at the front desk.

## 2023-08-08 ENCOUNTER — Encounter: Payer: Medicare PPO | Admitting: Occupational Therapy

## 2023-08-13 ENCOUNTER — Encounter: Payer: Medicare PPO | Admitting: Occupational Therapy

## 2023-08-15 ENCOUNTER — Encounter: Payer: Medicare PPO | Admitting: Occupational Therapy

## 2023-08-22 ENCOUNTER — Encounter: Payer: Medicare PPO | Admitting: Occupational Therapy

## 2023-08-27 ENCOUNTER — Encounter: Payer: Medicare PPO | Admitting: Occupational Therapy

## 2023-08-29 ENCOUNTER — Encounter: Payer: Medicare PPO | Admitting: Occupational Therapy

## 2023-09-03 ENCOUNTER — Encounter: Payer: Medicare PPO | Admitting: Occupational Therapy

## 2023-09-05 ENCOUNTER — Encounter: Payer: Medicare PPO | Admitting: Occupational Therapy

## 2023-11-14 ENCOUNTER — Ambulatory Visit (INDEPENDENT_AMBULATORY_CARE_PROVIDER_SITE_OTHER): Payer: Medicare PPO | Admitting: Nurse Practitioner

## 2023-11-14 VITALS — BP 136/79 | HR 89 | Resp 16 | Wt 175.4 lb

## 2023-11-14 DIAGNOSIS — I890 Lymphedema, not elsewhere classified: Secondary | ICD-10-CM | POA: Diagnosis not present

## 2023-11-14 DIAGNOSIS — I10 Essential (primary) hypertension: Secondary | ICD-10-CM | POA: Diagnosis not present

## 2023-11-19 ENCOUNTER — Encounter (INDEPENDENT_AMBULATORY_CARE_PROVIDER_SITE_OTHER): Payer: Self-pay | Admitting: Nurse Practitioner

## 2023-11-19 NOTE — Progress Notes (Signed)
Subjective:    Patient ID: Barbara Burgess, female    DOB: 1946/05/05, 77 y.o.   MRN: [id number] Chief Complaint  Patient presents with   Follow-up    6 month follow up    The patient returns to the office for followup evaluation regarding leg swelling.  The swelling has persisted but with the lymph pump is under much, much better controlled. The pain associated with swelling is decreased. There have not been any interval development of a ulcerations or wounds.  The patient denies problems with the pump, noting it is working well and the leggings are in good condition.  Since the previous visit the patient has been wearing graduated compression stockings and using the lymph pump on a routine basis and  has noted significant improvement in the lymphedema.   Patient stated the lymph pump has been helpful with the treatment of the lymphedema.      Review of Systems  Cardiovascular:  Positive for leg swelling.  All other systems reviewed and are negative.      Objective:   Physical Exam Vitals reviewed.  HENT:     Head: Normocephalic.  Cardiovascular:     Rate and Rhythm: Normal rate.     Pulses: Normal pulses.  Pulmonary:     Effort: Pulmonary effort is normal.  Skin:    General: Skin is warm and dry.  Neurological:     Mental Status: She is alert and oriented to person, place, and time.  Psychiatric:        Mood and Affect: Mood normal.        Behavior: Behavior normal.        Thought Content: Thought content normal.        Judgment: Judgment normal.     BP 136/79   Pulse 89   Resp 16   Wt 175 lb 6.4 oz (79.6 kg)   BMI 30.11 kg/m   Past Medical History:  Diagnosis Date   Arthritis    knee and hip,  per [facility].    Bilateral bunions    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HCC)     per [facility] New Patient Packet.    Colitis    H/O CT scan of chest 11/27/2018   By Roland Rack, per [facility] New Patient Packet.    H/O mammogram 11/27/2018   By  Pearlie Oyster at [facility], per [facility] New Patient Packet.    History of CT scan of abdomen 11/27/2018   By Roland Rack, per [facility] Patient Packet.    History of upper extremity x-ray 11/27/2018   By Lonny Prude, of Shoulder, per [facility] New Patient Packet.    History of x-ray of hip 11/27/2018   By Lonny Prude, of Hip, per [facility] New Patient Packet.    Hyperlipemia     per [facility] Patient Packet.    Hypertension     per [facility] Patient Packet.    Impingement syndrome of shoulder     per [facility] New Patient Packet.    Incontinence    Mild Nighttime per [facility] New Patient Packet.   Insomnia    Melanoma (HCC)    right lower leg; right ovary   Osteoarthritis of multiple joints    Rosacea     per [facility] New Patient Packet.    Sleep disorder     Social History   Socioeconomic History   Marital status: Widowed    Spouse name: Not on file   Number of  children: 2   Years of education: Not on file   Highest education level: Not on file  Occupational History   Occupation: Production designer, theatre/television/film    Comment: Retired  Tobacco Use   Smoking status: Former    Current packs/day: 0.00    Types: Cigarettes    Quit date: 2006    Years since quitting: 18.9   Smokeless tobacco: Never  Vaping Use   Vaping status: Never Used  Substance and Sexual Activity   Alcohol use: Yes    Comment: 2 a week   Drug use: Never   Sexual activity: Not on file  Other Topics Concern   Not on file  Social History Narrative   Husband was [employer]sel[employer] minister---they spent time overseas teaching   Son in [place]   Daughter in [place]         Has living will   Son is health care POA----daughter is alternate   Would accept resuscitation attempts   Doesn't want tube feeds if cognitively unaware      Per [facility] New Patient Packet, abstracted 12/09/19      Diet: Patient states that she tries to have a diet rich in fruit, vegetables, and whole grains. But needs to modify diet because of colitis.       Caffeine: Yes       Married, if yes what year: Widowed/ Married in 1968      Do you live in a house, apartment, assisted living, [place], trailer, ect: Villa at [employer], 1 person      Pets: 1 Dog   Highest Level of Education completed? B.A.      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       Exercise: Yes, daily walking          Living Will: Yes   DNR: Yes   POA/HPOA: Yes       Functional Status:   Do you have difficulty bathing or dressing yourself? No    Do you have difficulty preparing food or eating? No   Do you have difficulty managing your medications? No    Do you have difficulty managing your finances? No    Do you have difficulty affording your medications? No   Social Drivers of Corporate investment banker Strain: Low Risk  (10/08/2023)   Received from [facility]   Overall Financial Resource Strain (CARDIA)    Difficulty of Paying Living Expenses: Not hard at all  Food Insecurity: No Food Insecurity (10/08/2023)   [facility] from [facility]   Hunger Vital Sign    Worried About Running Out of Food in the Last Year: Never true    Ran Out of Food in the Last Year: Never true  Transportation Needs: No Transportation Needs (10/08/2023)   [facility] - Transportation    In the past 12 months, has lack of transportation kept you from medical appointments or from getting medications?: No    Lack of Transportation (Non-Medical): No  Physical Activity: Not on file  Stress: Not on file  Social Connections: Not on file  Intimate Partner Violence: Not on file    Past Surgical History:  Procedure Laterality Date   ABDOMINAL HYSTERECTOMY  11/28/1987   Partial per [facility] New Patient Packet   ARTHROSCOPIC REPAIR ACL Left 11/28/2003   Left Knee/ Torn Medial Meniscus per [facility] New Patient Packet  BREAST BIOPSY     CATARACT  EXTRACTION W/ INTRAOCULAR LENS IMPLANT Bilateral    COLONOSCOPY  11/27/2016   By Liberty Handy,  per [facility] New Patient Packet.    COLONOSCOPY  11/27/2017   By Liberty Handy, per [facility] New Patient Packet.    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   Procedure: TOTAL KNEE ARTHROPLASTY;  Surgeon: Juanell Fairly, MD;  Location: [facility] ORS;  Service: Orthopedics;  Laterality: Left;   TUBAL LIGATION  1976    Family History  Problem Relation Age of Onset   Alzheimer's disease Mother    Lung cancer Maternal Grandfather    Heart attack Paternal Grandfather    Diabetes Neg Hx    Breast cancer Neg Hx     Allergies  Allergen Reactions   Other Diarrhea   Atorvastatin     Elevated Liver Enzymes.        Latest Ref Rng & Units 12/22/2021    6:13 AM 12/21/2021    4:11 AM 12/20/2021    3:01 PM  CBC  WBC 4.0 - 10.5 K/uL 9.4  15.6  14.2   Hemoglobin 12.0 - 15.0 g/dL 81.1  91.4  78.2   Hematocrit 36.0 - 46.0 % 30.0  34.1  39.7   Platelets 150 - 400 K/uL 191  220  242       CMP     Component Value Date/Time   NA 138 12/21/2021 0411   NA 140 08/12/2020 0000   K 4.7 12/21/2021 0411   CL 108 12/21/2021 0411   CO2 24 12/21/2021 0411   GLUCOSE 157 (H) 12/21/2021 0411   BUN 14 12/21/2021 0411   BUN 13 08/12/2020 0000   CREATININE 0.66 12/21/2021 0411   CALCIUM 8.6 (L) 12/21/2021 0411   ALBUMIN 4.2 08/12/2020 0000   AST 36 (A) 08/12/2020 0000   ALT 57 (A) 08/12/2020 0000   ALKPHOS 85 08/12/2020 0000   GFRNONAA >60 12/21/2021 0411     No results found.     Assessment & Plan:   1. Lymphedema (Primary) Recommend:  No surgery or intervention at this point in time.    I have reviewed my previous  discussion with the patient regarding swelling and why it  causes symptoms.  The patient is doing well with compression and will continue wearing graduated compression on a daily basis. The patient will  continue wearing the compression first thing in the morning and removing them in the evening. The patient is instructed specifically not to sleep in the compression.    In addition, behavioral modification including elevation during the day and exercise as tolerated will be continued.    Patient should follow-up on an annual basis   2. Primary hypertension Continue antihypertensive medications as already ordered, these medications have been reviewed and there are no changes at this time.   Current Outpatient Medications on File Prior to Visit  Medication Sig Dispense Refill   amLODipine (NORVASC) 2.5 MG tablet Take 2.5 mg by mouth daily.     azelastine (ASTELIN) 0.1 % nasal spray Place 2 sprays into both nostrils daily.     CALCIUM CARBONATE-VITAMIN D PO  Take 2 tablets by mouth daily.     escitalopram (LEXAPRO) 10 MG tablet Take 10 mg by mouth daily.     Ivermectin 1 % CREA Apply topically.     mesalamine (LIALDA) 1.2 g EC tablet Take 1.2 g by mouth in the morning and at bedtime.     metroNIDAZOLE (METROCREAM) 0.75 % cream Apply 1 application  topically daily.     minoxidil (LONITEN) 2.5 MG tablet Take 2.5 mg by mouth daily.     rosuvastatin (CRESTOR) 10 MG tablet Take 1 tablet (10 mg total) by mouth daily. (Patient taking differently: Take 10 mg by mouth every evening.) 90 tablet 3   traZODone (DESYREL) 100 MG tablet Take 1 tablet (100 mg total) by mouth at bedtime. 90 tablet 3   meloxicam (MOBIC) 7.5 MG tablet Take 7.5 mg by mouth daily.     mirabegron ER (MYRBETRIQ) 25 MG TB24 tablet Take 25 mg by mouth daily.     omeprazole (PRILOSEC) 20 MG capsule Take 20 mg by mouth as needed.     No current facility-administered medications on file prior to visit.    There are no Patient  Instructions on file for this visit. No follow-ups on file.   Georgiana Spinner, NP

## 2023-12-24 ENCOUNTER — Encounter (INDEPENDENT_AMBULATORY_CARE_PROVIDER_SITE_OTHER): Payer: Self-pay

## 2024-01-28 ENCOUNTER — Telehealth: Payer: Self-pay | Admitting: Gastroenterology

## 2024-01-28 ENCOUNTER — Other Ambulatory Visit: Payer: Self-pay

## 2024-01-28 NOTE — Telephone Encounter (Signed)
 The patient called in to schedule a follow up Dr. Allegra Lai.

## 2024-01-29 NOTE — Progress Notes (Unsigned)
 Celso Amy, PA-C [address]  [address]  [place], [place] [address]  Main: [phone]  Fax: [phone]   Primary Care Physician: Mick Sell, MD  Primary Gastroenterologist:  Celso Amy, PA-C / Dr. Lannette Donath    CC:  Diarrhea; History of Ulcerative Proctosigmoiditis  HPI: MARCELINE NAPIERALA is a 78 y.o. female presents for evaluation of loose stools for 1 month.  She is having 3 loose stools per day.  She denies abdominal pain, rectal bleeding, nausea, vomiting, fever, or chills.  She had the flu in January and was on an antibiotic.  Diarrhea started after that.  She tried OTC Imodium with little benefit.  She admits to fatigue and abdominal bloating.  She tried low fiber diet with no benefit.  Recently started gluten-free diet 3 days ago with mild benefit.  She is worried about celiac disease.  Currently taking Lialda 1.2 g twice daily.  She states her iron has been low in the past 6 months.  Has been on iron tablet once daily.  She has history of left-sided ulcerative colitis, originally diagnosed in 2006.  Currently on Lialda 1.2g 1 tablet twice daily.  Patient last saw Dr. Allegra Lai 03/2021 for follow-up.  Has been in remission on Lialda since 2019.  Previously cared for by Dr. Jorene Minors at [facility].  Patient Moved to [facility] and transferred care to Dr. Allegra Lai in 2021.  Last colonoscopy 02/2020 at [facility]: Normal colon mucosa.  No active colitis.  Biopsy showed no dysplasia throughout the colon.  5-year repeat.  10/03/23 Labs: Normal CBC, CMP, B12, and Feritin.  Creatinine 0.7, GFR 89, Hgb 14.1g, Ferritin 33, B12 of 796.  Current Outpatient Medications  Medication Sig Dispense Refill   amLODipine (NORVASC) 2.5 MG tablet Take 2.5 mg by mouth daily.     azelastine (ASTELIN) 0.1 % nasal spray Place 2 sprays into both nostrils daily.     CALCIUM CARBONATE-VITAMIN D PO Take 2 tablets by mouth daily.     Ivermectin 1 % CREA Apply topically.      mesalamine (LIALDA) 1.2 g EC tablet Take 1.2 g by mouth in the morning and at bedtime.     minoxidil (LONITEN) 2.5 MG tablet Take 2.5 mg by mouth daily.     rosuvastatin (CRESTOR) 10 MG tablet Take 1 tablet (10 mg total) by mouth daily. (Patient taking differently: Take 10 mg by mouth every evening.) 90 tablet 3   traZODone (DESYREL) 100 MG tablet Take 1 tablet (100 mg total) by mouth at bedtime. 90 tablet 3   venlafaxine (EFFEXOR) 75 MG tablet Take 75 mg by mouth 2 (two) times daily.     No current facility-administered medications for this visit.    Allergies as of 01/30/2024 - Review Complete 01/30/2024  Allergen Reaction Noted   Lactose Diarrhea 01/28/2024   Other Diarrhea 12/25/2022   Atorvastatin  01/13/2017    Past Medical History:  Diagnosis Date   Arthritis    knee and hip,  per [facility] New Patient Packet.    Bilateral bunions    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HCC)     per [facility] New Patient Packet.    Colitis    H/O CT scan of chest 11/27/2018   By Roland Rack, per [facility] New Patient Packet.    H/O mammogram 11/27/2018   By Pearlie Oyster at [facility], per [facility] New Patient Packet.    History of CT scan of abdomen 11/27/2018   By Roland Rack,  per [facility] New Patient Packet.    History of upper extremity x-ray 11/27/2018   By Lonny Prude, of Shoulder, per [facility] New Patient Packet.    History of x-ray of hip 11/27/2018   By Lonny Prude, of Hip, per [facility] New Patient Packet.    Hyperlipemia     per [facility] New Patient Packet.    Hypertension     per [facility] New Patient Packet.    Impingement syndrome of shoulder     per [facility] New Patient Packet.    Incontinence    Mild Nighttime per [facility] New Patient Packet.   Insomnia    Melanoma (HCC)    right lower leg; right ovary   Melanoma of skin (HCC) 09/13/2011   09/05/2011 Exploratory laparotomy and debulking of right pelvic sidewall mass.~ 2. Bilateral salpingo-oophorectomy.~ 3. Right pelvic lymph node sampling.~~~; Path: Right pelvic  sidewall mass and right fallopian tube and ovary, excision; - Metastatic malignant melanoma involving matted lymph nodes with extracapsular; extension, size: 9.0 x 7.5 x 4.5 cm; ; Braf neg; ;  Stage III or stage IV melanoma   Osteoarthritis of multiple joints    Pulmonary nodule, right 12/12/2018   See on CT chest January 2020. Pt had recent URI.     Rosacea     per [facility] New Patient Packet.    Sleep disorder     Past Surgical History:  Procedure Laterality Date   ABDOMINAL HYSTERECTOMY  11/28/1987   Partial per [facility] New Patient Packet   ARTHROSCOPIC REPAIR ACL Left 11/28/2003   Left Knee/ Torn Medial Meniscus per [facility] New Patient Packet   BREAST BIOPSY     CATARACT EXTRACTION W/ INTRAOCULAR LENS IMPLANT Bilateral    COLONOSCOPY  11/27/2016   By Liberty Handy,  per [facility] New Patient Packet.    COLONOSCOPY  11/27/2017   By Liberty Handy, per [facility] New Patient Packet.    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   Procedure: TOTAL KNEE ARTHROPLASTY;  Surgeon: Juanell Fairly, MD;  Location: [facility] ORS;  Service: Orthopedics;  Laterality: Left;   TUBAL LIGATION  1976    Review of Systems:    All systems reviewed and negative except where noted in HPI.   Physical Examination:   BP (!) 140/68   Pulse 97   Temp 97.8 F (36.6 C)   Ht 5\' 4"  (1.626 m)   Wt 175 lb (79.4 kg)   BMI 30.04 kg/m   General: Well-nourished, well-developed in no acute distress.  Lungs: Clear to auscultation bilaterally. Non-labored. Heart: Regular rate and rhythm, no murmurs rubs or gallops.  Abdomen: Bowel sounds are normal; Abdomen is Soft; No hepatosplenomegaly, masses or hernias;  No Abdominal Tenderness; No guarding or rebound  tenderness. Neuro: Alert and oriented x 3.  Grossly intact.  Psych: Alert and cooperative, normal mood and affect.   Imaging Studies: No results found.  Assessment and Plan:   PORCHE STEINBERGER is a 78 y.o. y/o female presents for:  1.  Diarrhea and loose stools for 1 month; differential includes flareup of ulcerative colitis, C. difficile infection, IBS, celiac.  Labs: CBC, CMP, CRP, celiac panel  Stool studies: C. difficile PCR toxin, fecal calprotectin  She was on antibiotic in January.  Need to rule out C. difficile.     2.  History of chronic ulcerative proctosigmoiditis; she has not had any episodes of rectal bleeding.  Appears to be in clinical remission on Lialda 1.2 g twice daily.  If fecal calprotectin is elevated, then plan to increase Lialda to 4 tablets daily.  Also plan to repeat colonoscopy.  3.  History of iron deficiency  Lab CBC and iron panel  Decide about repeat colonoscopy/EGD based on above test results   Celso Amy, PA-C  Follow up in 6 weeks with Dr. Allegra Lai

## 2024-01-30 ENCOUNTER — Encounter: Payer: Self-pay | Admitting: Physician Assistant

## 2024-01-30 ENCOUNTER — Ambulatory Visit (INDEPENDENT_AMBULATORY_CARE_PROVIDER_SITE_OTHER): Admitting: Physician Assistant

## 2024-01-30 VITALS — BP 140/68 | HR 97 | Temp 97.8°F | Ht 64.0 in | Wt 175.0 lb

## 2024-01-30 DIAGNOSIS — Z862 Personal history of diseases of the blood and blood-forming organs and certain disorders involving the immune mechanism: Secondary | ICD-10-CM | POA: Diagnosis not present

## 2024-01-30 DIAGNOSIS — D508 Other iron deficiency anemias: Secondary | ICD-10-CM

## 2024-01-30 DIAGNOSIS — R197 Diarrhea, unspecified: Secondary | ICD-10-CM

## 2024-01-30 DIAGNOSIS — K5130 Ulcerative (chronic) rectosigmoiditis without complications: Secondary | ICD-10-CM | POA: Diagnosis not present

## 2024-01-30 DIAGNOSIS — K5120 Ulcerative (chronic) proctitis without complications: Secondary | ICD-10-CM

## 2024-01-31 ENCOUNTER — Encounter: Payer: Self-pay | Admitting: Physician Assistant

## 2024-02-02 LAB — CALPROTECTIN, FECAL: Calprotectin, Fecal: 504 ug/g — ABNORMAL HIGH (ref 0–120)

## 2024-02-03 LAB — CBC WITH DIFFERENTIAL/PLATELET
Basophils Absolute: 0.1 10*3/uL (ref 0.0–0.2)
Basos: 1 %
EOS (ABSOLUTE): 0.1 10*3/uL (ref 0.0–0.4)
Eos: 1 %
Hematocrit: 44.9 % (ref 34.0–46.6)
Hemoglobin: 14.9 g/dL (ref 11.1–15.9)
Immature Grans (Abs): 0 10*3/uL (ref 0.0–0.1)
Immature Granulocytes: 1 %
Lymphocytes Absolute: 1.9 10*3/uL (ref 0.7–3.1)
Lymphs: 27 %
MCH: 30.8 pg (ref 26.6–33.0)
MCHC: 33.2 g/dL (ref 31.5–35.7)
MCV: 93 fL (ref 79–97)
Monocytes Absolute: 0.8 10*3/uL (ref 0.1–0.9)
Monocytes: 11 %
Neutrophils Absolute: 4.1 10*3/uL (ref 1.4–7.0)
Neutrophils: 59 %
Platelets: 238 10*3/uL (ref 150–450)
RBC: 4.84 x10E6/uL (ref 3.77–5.28)
RDW: 13.9 % (ref 11.7–15.4)
WBC: 6.9 10*3/uL (ref 3.4–10.8)

## 2024-02-03 LAB — COMPREHENSIVE METABOLIC PANEL
ALT: 22 IU/L (ref 0–32)
AST: 20 IU/L (ref 0–40)
Albumin: 4.6 g/dL (ref 3.8–4.8)
Alkaline Phosphatase: 98 IU/L (ref 44–121)
BUN/Creatinine Ratio: 17 (ref 12–28)
BUN: 14 mg/dL (ref 8–27)
Bilirubin Total: 0.4 mg/dL (ref 0.0–1.2)
CO2: 22 mmol/L (ref 20–29)
Calcium: 9.6 mg/dL (ref 8.7–10.3)
Chloride: 104 mmol/L (ref 96–106)
Creatinine, Ser: 0.82 mg/dL (ref 0.57–1.00)
Globulin, Total: 2.2 g/dL (ref 1.5–4.5)
Glucose: 85 mg/dL (ref 70–99)
Potassium: 4.6 mmol/L (ref 3.5–5.2)
Sodium: 139 mmol/L (ref 134–144)
Total Protein: 6.8 g/dL (ref 6.0–8.5)
eGFR: 74 mL/min/{1.73_m2} (ref >59)

## 2024-02-03 LAB — CLOSTRIDIUM DIFFICILE BY PCR: Toxigenic C. Difficile by PCR: NEGATIVE

## 2024-02-03 LAB — IRON,TIBC AND FERRITIN PANEL
Ferritin: 91 ng/mL (ref 15–150)
Iron Saturation: 17 % (ref 15–55)
Iron: 53 ug/dL (ref 27–139)
Total Iron Binding Capacity: 309 ug/dL (ref 250–450)
UIBC: 256 ug/dL (ref 118–369)

## 2024-02-03 LAB — CELIAC DISEASE AB SCREEN W/RFX
Antigliadin Abs, IgA: 2 U (ref 0–19)
IgA/Immunoglobulin A, Serum: 156 mg/dL (ref 64–422)
Transglutaminase IgA: <2 U/mL (ref 0–3)

## 2024-02-03 LAB — C-REACTIVE PROTEIN: CRP: 4 mg/L (ref 0–10)

## 2024-02-04 ENCOUNTER — Telehealth: Payer: Self-pay

## 2024-02-04 ENCOUNTER — Other Ambulatory Visit: Payer: Self-pay

## 2024-02-04 DIAGNOSIS — R197 Diarrhea, unspecified: Secondary | ICD-10-CM

## 2024-02-04 MED ORDER — PEG 3350-KCL-NA BICARB-NACL 420 G PO SOLR: 4000.0000 mL | Freq: Once | ORAL | 0 refills | Status: AC

## 2024-02-04 NOTE — Progress Notes (Signed)
 Call and notify patient her stool tests show: 1.  C. difficile infection is negative.  Good news. No C. Diff infection. 2.  Fecal calprotectin is moderately elevated.  I am concerned that she may be having a flareup of her ulcerative colitis.  I recommend increase Lialda to 4 tablets daily.  I also recommend go ahead and schedule a colonoscopy with Dr. Allegra Lai, next available, diagnosis ulcerative colitis and diarrhea. Celso Amy, PA-C

## 2024-02-04 NOTE — Telephone Encounter (Signed)
 Spoke with patient -Colonoscopy scheduled 03/06/24-Golytely sent to pharmacy. Mailed instructions today to patient.   1.  C. difficile infection is negative.  Good news. No C. Diff infection.  2.  Fecal calprotectin is moderately elevated.  I am concerned that she may be having a flareup of her ulcerative colitis.  I recommend increase Lialda to 4 tablets daily.  I also recommend go ahead and schedule a colonoscopy with Dr. Allegra Lai, next available, diagnosis ulcerative colitis and diarrhea.  Celso Amy, PA-C

## 2024-02-05 ENCOUNTER — Telehealth: Payer: Self-pay

## 2024-02-05 NOTE — Telephone Encounter (Signed)
 Patient left a message wanting to know where her colonoscopy is schedule at. She left message on my voicemail at 12:24pm. Return patient call and informed her [facility] and check in at the registration desk. She verbalized understanding

## 2024-02-10 ENCOUNTER — Encounter: Payer: Self-pay | Admitting: Physician Assistant

## 2024-02-16 ENCOUNTER — Encounter: Payer: Self-pay | Admitting: Gastroenterology

## 2024-02-16 DIAGNOSIS — R197 Diarrhea, unspecified: Secondary | ICD-10-CM

## 2024-02-18 MED ORDER — BUDESONIDE 3 MG PO CPEP: 9.0000 mg | ORAL_CAPSULE | Freq: Every day | ORAL | 0 refills | Status: DC

## 2024-02-18 MED ORDER — BUDESONIDE 3 MG PO CPEP
9.0000 mg | ORAL_CAPSULE | Freq: Every day | ORAL | 0 refills | Status: DC
Start: 2024-02-18 — End: 2024-02-18

## 2024-02-20 ENCOUNTER — Other Ambulatory Visit: Payer: Self-pay | Admitting: Gastroenterology

## 2024-02-20 MED ORDER — BUDESONIDE 3 MG PO CPEP: 9.0000 mg | ORAL_CAPSULE | Freq: Every day | ORAL | 0 refills | Status: DC

## 2024-02-20 NOTE — Telephone Encounter (Signed)
 I submitted a PA through cover my meds to see if we could get the medication approved by the patient insurance company

## 2024-02-21 LAB — GI PROFILE, STOOL, PCR

## 2024-02-27 ENCOUNTER — Encounter: Payer: Self-pay | Admitting: Gastroenterology

## 2024-03-06 ENCOUNTER — Encounter: Payer: Self-pay | Admitting: Gastroenterology

## 2024-03-06 ENCOUNTER — Ambulatory Visit
Admission: RE | Admit: 2024-03-06 | Discharge: 2024-03-06 | Disposition: A | Discharge status: Home / Self Care | Attending: Gastroenterology | Admitting: Gastroenterology

## 2024-03-06 ENCOUNTER — Ambulatory Visit: Admitting: Anesthesiology

## 2024-03-06 ENCOUNTER — Encounter
Admission: RE | Disposition: A | Discharge status: Home / Self Care | Payer: Self-pay | Source: Home / Self Care | Attending: Gastroenterology

## 2024-03-06 ENCOUNTER — Other Ambulatory Visit: Payer: Self-pay

## 2024-03-06 DIAGNOSIS — K5150 Left sided colitis without complications: Secondary | ICD-10-CM | POA: Diagnosis not present

## 2024-03-06 DIAGNOSIS — K5130 Ulcerative (chronic) rectosigmoiditis without complications: Secondary | ICD-10-CM | POA: Insufficient documentation

## 2024-03-06 DIAGNOSIS — Z87891 Personal history of nicotine dependence: Secondary | ICD-10-CM | POA: Diagnosis not present

## 2024-03-06 DIAGNOSIS — R197 Diarrhea, unspecified: Secondary | ICD-10-CM

## 2024-03-06 DIAGNOSIS — I10 Essential (primary) hypertension: Secondary | ICD-10-CM | POA: Insufficient documentation

## 2024-03-06 DIAGNOSIS — K529 Noninfective gastroenteritis and colitis, unspecified: Secondary | ICD-10-CM | POA: Diagnosis not present

## 2024-03-06 DIAGNOSIS — K6289 Other specified diseases of anus and rectum: Secondary | ICD-10-CM

## 2024-03-06 HISTORY — DX: Lymphedema, not elsewhere classified: I89.0

## 2024-03-06 HISTORY — PX: COLONOSCOPY: SHX5424

## 2024-03-06 SURGERY — COLONOSCOPY
Anesthesia: General

## 2024-03-06 MED ORDER — PREDNISONE 10 MG PO TABS: ORAL_TABLET | ORAL | 0 refills | Status: DC

## 2024-03-06 MED ORDER — PROPOFOL 500 MG/50ML IV EMUL
INTRAVENOUS | Status: DC | PRN
  Administered 2024-03-06: 130 ug/kg/min via INTRAVENOUS

## 2024-03-06 MED ORDER — PROPOFOL 10 MG/ML IV BOLUS
INTRAVENOUS | Status: DC | PRN
  Administered 2024-03-06: 80 mg via INTRAVENOUS

## 2024-03-06 MED ORDER — LIDOCAINE HCL (CARDIAC) PF 100 MG/5ML IV SOSY
PREFILLED_SYRINGE | INTRAVENOUS | Status: DC | PRN
  Administered 2024-03-06: 60 mg via INTRAVENOUS

## 2024-03-06 MED ORDER — SODIUM CHLORIDE 0.9 % IV SOLN: INTRAVENOUS | Status: DC

## 2024-03-06 MED ORDER — EPHEDRINE 5 MG/ML INJ
INTRAVENOUS | Status: AC
  Filled 2024-03-06: qty 5

## 2024-03-06 NOTE — Op Note (Signed)
 [facility] Patient Name: Barbara Burgess Procedure Date: 03/06/2024 1:01 PM MRN: [id number] Account #: [id number] Date of Birth: 03-03-1946 Admit Type: Outpatient Age: 78 Room: [facility] ENDO ROOM 3 Gender: Female Note Status: Finalized Instrument Name: Colonoscope [id number] Procedure:             Colonoscopy Indications:           Follow-up of left-sided chronic ulcerative colitis,                         Disease activity assessment of left-sided chronic                         ulcerative colitis Providers:             Toney Reil MD, MD Referring MD:          Teena Irani. Sampson Goon (Referring MD) Medicines:             General Anesthesia Complications:         No immediate complications. Estimated blood loss: None. Procedure:             Pre-Anesthesia Assessment:                        - Prior to the procedure, a History and Physical was                         performed, and patient medications and allergies were                         reviewed. The patient is competent. The risks and                         benefits of the procedure and the sedation options and                         risks were discussed with the patient. All questions                         were answered and informed consent was obtained.                         Patient identification and proposed procedure were                         verified by the physician, the nurse, the                         anesthesiologist, the anesthetist and the technician                         in the pre-procedure area in the procedure room in the                         endoscopy suite. Mental Status Examination: alert and                         oriented. Airway Examination: normal oropharyngeal  airway and neck mobility. Respiratory Examination:                         clear to auscultation. CV Examination: normal.                         Prophylactic Antibiotics: The  patient does not require                         prophylactic antibiotics. Prior Anticoagulants: The                         patient has taken no anticoagulant or antiplatelet                         agents. ASA Grade Assessment: II - A patient with mild                         systemic disease. After reviewing the risks and                         benefits, the patient was deemed in satisfactory                         condition to undergo the procedure. The anesthesia                         plan was to use general anesthesia. Immediately prior                         to administration of medications, the patient was                         re-assessed for adequacy to receive sedatives. The                         heart rate, respiratory rate, oxygen saturations,                         blood pressure, adequacy of pulmonary ventilation, and                         response to care were monitored throughout the                         procedure. The physical status of the patient was                         re-assessed after the procedure.                        After obtaining informed consent, the colonoscope was                         passed under direct vision. Throughout the procedure,                         the patient's blood pressure, pulse, and oxygen  saturations were monitored continuously. The                         Colonoscope was introduced through the anus and                         advanced to the the terminal ileum, with                         identification of the appendiceal orifice and IC                         valve. The colonoscopy was performed without                         difficulty. The patient tolerated the procedure well.                         The quality of the bowel preparation was adequate. The                         terminal ileum, ileocecal valve, appendiceal orifice,                         and rectum were  photographed. Findings:      The perianal and digital rectal examinations were normal. Pertinent       negatives include normal sphincter tone and no palpable rectal lesions.      The terminal ileum appeared normal. Biopsies were taken with a cold       forceps for histology.      Inflammation was found in a continuous and circumferential pattern from       the rectum to the sigmoid colon. This was graded as Mayo Score 1 (mild,       with erythema, decreased vascular pattern, mild friability), and when       compared to the previous examination, the findings are unchanged.       Biopsies were taken with a cold forceps for histology.      Normal mucosa was found in the descending colon, in the transverse       colon, in the ascending colon and in the cecum. Biopsies were taken with       a cold forceps for histology. Impression:            - The examined portion of the ileum was normal.                         Biopsied.                        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                         Biopsied.                        - Normal mucosa in the descending colon, in the  transverse colon, in the ascending colon and in the                         cecum. Biopsied. Recommendation:        - Discharge patient to home (with escort).                        - Resume previous diet today.                        - Continue present medications.                        - Await pathology results.                        - Return to my office at appointment to be scheduled. 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 All rights reserved. The codes documented in this report are preliminary and upon coder review may  be revised to meet current compliance requirements. Dr. Libby Maw Toney Reil MD, MD 03/06/2024 1:43:36 PM This report has been signed electronically. Number of Addenda: 0 Note Initiated On: 03/06/2024 1:01 PM Scope Withdrawal Time: 0 hours 18 minutes 32 seconds  Total Procedure Duration: 0 hours 22 minutes 47 seconds  Estimated Blood Loss:  Estimated blood loss: none.      [facility]

## 2024-03-06 NOTE — Transfer of Care (Signed)
 Immediate Anesthesia Transfer of Care Note  Patient: Barbara Burgess  Procedure(s) Performed: COLONOSCOPY  Patient Location: Endoscopy Unit  Anesthesia Type:General  Level of Consciousness: alert  and oriented  Airway & Oxygen Therapy: Patient Spontanous Breathing  Post-op Assessment: Report given to RN and Post -op Vital signs reviewed and stable  Post vital signs: Reviewed and stable  Last Vitals:  Vitals Value Taken Time  BP 107/48 03/06/24 1344  Temp 35.6 C 03/06/24 1344  Pulse 72 03/06/24 1346  Resp 19 03/06/24 1346  SpO2 97 % 03/06/24 1346  Vitals shown include unfiled device data.  Last Pain:  Vitals:   03/06/24 1344  TempSrc: Temporal  PainSc: 0-No pain         Complications: No notable events documented.

## 2024-03-06 NOTE — Anesthesia Postprocedure Evaluation (Signed)
 Anesthesia Post Note  Patient: Barbara Burgess  Procedure(s) Performed: COLONOSCOPY  Patient location during evaluation: PACU Anesthesia Type: General Level of consciousness: awake Pain management: satisfactory to patient Vital Signs Assessment: post-procedure vital signs reviewed and stable Respiratory status: spontaneous breathing Cardiovascular status: stable Anesthetic complications: no   No notable events documented.   Last Vitals:  Vitals:   03/06/24 1204 03/06/24 1344  BP: (!) 144/69 (!) 107/48  Pulse: 64 78  Resp: 16 18  Temp: (!) 36.1 C (!) 35.6 C  SpO2: 99% 96%    Last Pain:  Vitals:   03/06/24 1344  TempSrc: Temporal  PainSc: 0-No pain                 VAN STAVEREN,Fenix Rorke

## 2024-03-06 NOTE — H&P (Signed)
 Arlyss Repress, MD [address]  [address]  [place], [place] [address]  Main: [phone]  Fax: [phone] Pager: [phone]  Primary Care Physician:  Mick Sell, MD Primary Gastroenterologist:  Dr. Arlyss Repress  Pre-Procedure History & Physical: HPI:  Barbara Burgess is a 78 y.o. female is here for an colonoscopy.   Past Medical History:  Diagnosis Date   Arthritis    knee and hip,  per [facility] New Patient Packet.    Bilateral bunions    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HCC)     per [facility] New Patient Packet.    Colitis    H/O CT scan of chest 11/27/2018   By Roland Rack, per [facility] New Patient Packet.    H/O mammogram 11/27/2018   By Pearlie [facility]yster at [facility], per [facility] New Patient Packet.    History of CT scan of abdomen 11/27/2018   By Roland Rack, per [facility] New Patient Packet.    History of upper extremity x-ray 11/27/2018   By Lonny Prude, of Shoulder, per [facility] New Patient Packet.    History of x-ray of hip 11/27/2018   By Lonny Prude, of Hip, per [facility] New Patient Packet.    Hyperlipemia     per [facility] New Patient Packet.    Hypertension     per [facility] New Patient Packet.    Impingement syndrome of shoulder     per [facility] New Patient Packet.    Incontinence    Mild Nighttime per [facility] New Patient Packet.   Insomnia    Lymphedema    Melanoma (HCC)    right lower leg; right ovary   Melanoma of skin (HCC) 09/13/2011   09/05/2011 Exploratory laparotomy and debulking of right pelvic sidewall mass.~ 2. Bilateral salpingo-oophorectomy.~ 3. Right pelvic lymph node sampling.~~~; Path: Right pelvic sidewall mass and right fallopian tube and ovary, excision; - Metastatic malignant melanoma involving matted lymph nodes with extracapsular; extension, size: 9.0 x 7.5 x 4.5 cm; ; Braf neg; ;  Stage III or stage IV melanoma   Osteoarthritis of multiple joints    Pulmonary nodule, right 12/12/2018   See on CT chest January 2020. Pt had recent URI.     Rosacea     per  [facility] New Patient Packet.    Sleep disorder     Past Surgical History:  Procedure Laterality Date   ABDOMINAL HYSTERECTOMY  11/28/1987   Partial per [facility] New Patient Packet   ARTHROSCOPIC REPAIR ACL Left 11/28/2003   Left Knee/ Torn Medial Meniscus per [facility] New Patient Packet   BREAST BIOPSY     CATARACT EXTRACTION W/ INTRAOCULAR LENS IMPLANT Bilateral    COLONOSCOPY  11/27/2016   By Liberty Handy,  per [facility] New Patient Packet.    COLONOSCOPY  11/27/2017   By Liberty Handy, per [facility] New Patient Packet.    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   Procedure: TOTAL KNEE ARTHROPLASTY;  Surgeon: Juanell Fairly, MD;  Location: [facility] ORS;  Service: Orthopedics;  Laterality: Left;   TUBAL LIGATION  1976    Prior to Admission medications   Medication Sig Start Date End Date Taking? Authorizing  Provider  amLODipine (NORVASC) 2.5 MG tablet Take 2.5 mg by mouth daily.   Yes [provider]  budesonide (ENTOCORT EC) 3 MG 24 hr capsule Take 3 capsules (9 mg total) by mouth daily. 02/20/24  Yes Wynnie Pacetti, Loel Dubonnet, MD  minoxidil (LONITEN) 2.5 MG tablet Take 2.5 mg by mouth daily. 06/27/22  Yes [provider]  rosuvastatin (CRESTOR) 10 MG tablet Take 1 tablet (10 mg total) by mouth daily. Patient taking differently: Take 10 mg by mouth every evening. 01/28/20  Yes Sharon Seller, NP  traZODone (DESYREL) 100 MG tablet Take 1 tablet (100 mg total) by mouth at bedtime. 01/27/20  Yes Sharon Seller, NP  venlafaxine (EFFEXOR) 75 MG tablet Take 75 mg by mouth 2 (two) times daily.   Yes [provider]  azelastine (ASTELIN) 0.1 % nasal spray Place 2 sprays into both nostrils daily. 09/20/21    [provider]  CALCIUM CARBONATE-VITAMIN D PO Take 2 tablets by mouth daily.    [provider]  Ivermectin 1 % CREA Apply topically.    [provider]    Allergies as of 02/04/2024 - Review Complete 01/30/2024  Allergen Reaction Noted   Lactose Diarrhea 01/28/2024   Other Diarrhea 12/25/2022   Atorvastatin  01/13/2017    Family History  Problem Relation Age of Onset   Alzheimer's disease Mother    Lung cancer Maternal Grandfather    Heart attack Paternal Grandfather    Diabetes Neg Hx    Breast cancer Neg Hx     Social History   Socioeconomic History   Marital status: Widowed    Spouse name: Not on file   Number of children: 2   Years of education: Not on file   Highest education level: Not on file  Occupational History   Occupation: Production designer, theatre/television/film    Comment: Retired  Tobacco Use   Smoking status: Former    Current packs/day: 0.00    Types: Cigarettes    Quit date: 2006    Years since quitting: 19.2   Smokeless tobacco: Never  Vaping Use   Vaping status: Never Used  Substance and Sexual Activity   Alcohol use: Yes    Comment: occasional   Drug use: Never   Sexual activity: Not on file  Other Topics Concern   Not on file  Social History Narrative   Husband was [employer]sel[employer] minister---they spent time overseas teaching   Son in [place]   Daughter in [place]         Has living will   Son is health care POA----daughter is alternate   Would accept resuscitation attempts   Doesn't want tube feeds if cognitively unaware      Per [facility] New Patient Packet, abstracted 12/09/19      Diet: Patient states that she tries to have a diet rich in fruit, vegetables, and whole grains. But needs to modify diet because of colitis.       Caffeine: Yes      Married, if yes what year: Widowed/ Married in 1968      Do you live in a house, apartment, assisted living, [place], trailer, ect: Villa at [employer] [facility], 1 person      Pets: 1  Dog   Highest Level of Education completed? B.A.      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       Exercise: Yes, daily walking  Living Will: Yes   DNR: Yes   POA/HPOA: Yes       Functional Status:   Do you have difficulty bathing or dressing yourself? No    Do you have difficulty preparing food or eating? No   Do you have difficulty managing your medications? No    Do you have difficulty managing your finances? No    Do you have difficulty affording your medications? No   Social Drivers of Corporate investment banker Strain: Low Risk  (01/11/2024)   Received from [facility]   Overall Financial Resource Strain (CARDIA)    Difficulty of Paying Living Expenses: Not hard at all  Food Insecurity: No Food Insecurity (01/11/2024)   Received from [facility]   Hunger Vital Sign    Worried About Running Out of Food in the Last Year: Never true    Ran Out of Food in the Last Year: Never true  Transportation Needs: No Transportation Needs (01/11/2024)   Received from [facility]    In the past 12 months, has lack of transportation kept you from medical appointments or from getting medications?: No    Lack of Transportation (Non-Medical): No  Physical Activity: Not on file  Stress: Not on file  Social Connections: Not on file  Intimate Partner Violence: Not on file    Review of Systems: See HPI, otherwise negative ROS  Physical Exam: BP (!) 144/69   Pulse 64   Temp (!) 96.9 F (36.1 C) (Temporal)   Resp 16   Ht 5\' 4"  (1.626 m)   Wt 77.4 kg   SpO2 99%   BMI 29.28 kg/m  General:   Alert,  pleasant and cooperative in NAD Head:  Normocephalic and atraumatic. Neck:  Supple; no masses or thyromegaly. Lungs:  Clear throughout to auscultation.    Heart:  Regular rate and rhythm. Abdomen:  Soft, nontender and  nondistended. Normal bowel sounds, without guarding, and without rebound.   Neurologic:  Alert and  oriented x4;  grossly normal neurologically.  Impression/Plan: Barbara Burgess is here for an colonoscopy to be performed for left sided UC  Risks, benefits, limitations, and alternatives regarding  colonoscopy have been reviewed with the patient.  Questions have been answered.  All parties agreeable.   Lannette Donath, MD  03/06/2024, 12:12 PM

## 2024-03-06 NOTE — Anesthesia Preprocedure Evaluation (Signed)
 Anesthesia Evaluation  Patient identified by MRN, date of birth, ID band Patient awake    Reviewed: Allergy & Precautions, NPO status , Patient's Chart, lab work & pertinent test results  Airway Mallampati: II  TM Distance: >3 FB Neck ROM: full    Dental  (+) Teeth Intact   Pulmonary neg pulmonary ROS, former smoker   Pulmonary exam normal breath sounds clear to auscultation       Cardiovascular Exercise Tolerance: Good hypertension, Pt. on medications negative cardio ROS Normal cardiovascular exam Rhythm:Regular Rate:Normal     Neuro/Psych negative neurological ROS  negative psych ROS   GI/Hepatic negative GI ROS, Neg liver ROS, PUD,,,  Endo/Other  negative endocrine ROS    Renal/GU negative Renal ROS  negative genitourinary   Musculoskeletal   Abdominal Normal abdominal exam  (+)   Peds negative pediatric ROS (+)  Hematology negative hematology ROS (+)   Anesthesia Other Findings Past Medical History: No date: Arthritis     Comment:  knee and hip,  per [facility] New Patient Packet.  No date: Bilateral bunions No date: 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HCC)     Comment:   per [facility] New Patient Packet.  No date: Colitis 11/27/2018: H/O CT scan of chest     Comment:  By Roland Rack, per [facility] New Patient Packet.  11/27/2018: H/O mammogram     Comment:  By [facility]yster at [facility], per [facility] New Patient Packet.  11/27/2018: History of CT scan of abdomen     Comment:  By Roland Rack, per [facility] New Patient Packet.  11/27/2018: History of upper extremity x-ray     Comment:  By Lonny Prude, of Shoulder, per [facility] New Patient               Packet.  11/27/2018: History of x-ray of hip     Comment:  By Lonny Prude, of Hip, per [facility] New Patient               Packet.  No date: Hyperlipemia     Comment:   per [facility] New Patient Packet.  No date: Hypertension     Comment:   per [facility] New Patient Packet.  No date:  Impingement syndrome of shoulder     Comment:   per [facility] New Patient Packet.  No date: Incontinence     Comment:  Mild Nighttime per [facility] New Patient Packet. No date: Insomnia No date: Lymphedema No date: Melanoma [facility])     Comment:  right lower leg; right ovary 09/13/2011: Melanoma of skin (HCC)     Comment:  09/05/2011 Exploratory laparotomy and debulking of right              pelvic sidewall mass.~ 2. Bilateral               salpingo-oophorectomy.~ 3. Right pelvic lymph node               sampling.~~~; Path: Right pelvic sidewall mass and right               fallopian tube and ovary, excision; - Metastatic               malignant melanoma involving matted lymph nodes with               extracapsular; extension, size: 9.0 x 7.5 x 4.5 cm; ;               Braf neg; ;  Stage III or stage IV melanoma  No date: Osteoarthritis of multiple joints 12/12/2018: Pulmonary nodule, right     Comment:  See on CT chest January 2020. Pt had recent URI.   No date: Rosacea     Comment:   per [facility] New Patient Packet.  No date: Sleep disorder  Past Surgical History: 11/28/1987: ABDOMINAL HYSTERECTOMY     Comment:  Partial per [facility] New Patient Packet 11/28/2003: ARTHROSCOPIC REPAIR ACL; Left     Comment:  Left Knee/ Torn Medial Meniscus per pcs New Patient               Packet No date: BREAST BIOPSY No date: CATARACT EXTRACTION W/ INTRAOCULAR LENS IMPLANT; Bilateral 11/27/2016: COLONOSCOPY     Comment:  By Liberty Handy,  per [facility] New Patient Packet.  11/27/2017: COLONOSCOPY     Comment:  By Liberty Handy, per [facility] New Patient Packet.  11/28/1987: HYSTEROTOMY     Comment:  per [facility] New Patient Packet No date: KNEE SURGERY 11/27/1998: MELANOMA EXCISION     Comment:  Right Shin per [facility] New Patient Packet 11/27/2010: OOPHORECTOMY     Comment:  along with melanoma mass per [facility] New Patient Packet 11/27/1950: TONSILLECTOMY     Comment:  per [facility] New Patient Packet 12/20/2021: TOTAL KNEE ARTHROPLASTY;  Left     Comment:  Procedure: TOTAL KNEE ARTHROPLASTY;  Surgeon: Juanell Fairly, MD;  Location: [facility] ORS;  Service: Orthopedics;                Laterality: Left; 1976: TUBAL LIGATION     Reproductive/Obstetrics negative OB ROS                             Anesthesia Physical Anesthesia Plan  ASA: 2  Anesthesia Plan: General   Post-op Pain Management:    Induction: Intravenous  PONV Risk Score and Plan: Propofol infusion and TIVA  Airway Management Planned: Natural Airway and Nasal Cannula  Additional Equipment:   Intra-op Plan:   Post-operative Plan:   Informed Consent: I have reviewed the patients History and Physical, chart, labs and discussed the procedure including the risks, benefits and alternatives for the proposed anesthesia with the patient or authorized representative who has indicated his/her understanding and acceptance.     Dental Advisory Given  Plan Discussed with: CRNA  Anesthesia Plan Comments:        Anesthesia Quick Evaluation

## 2024-03-07 ENCOUNTER — Encounter: Payer: Self-pay | Admitting: Gastroenterology

## 2024-03-07 LAB — SURGICAL PATHOLOGY

## 2024-03-11 ENCOUNTER — Telehealth: Payer: Self-pay

## 2024-03-11 NOTE — Telephone Encounter (Signed)
-----   Message from [facility] sent at 03/10/2024  3:47 PM EDT ----- Please inform patient that the pathology results from recent colonoscopy confirms that her ulcerative colitis has progressed to the entire colon but predominantly in the rectum and sigmoid area.  Continue prednisone as prescribed and will see her to discuss about next steps on 4/22  RV

## 2024-03-11 NOTE — Telephone Encounter (Signed)
 Patient verbalized understanding of results she states she is looking forward to the appointment on 03/18/2024

## 2024-03-13 ENCOUNTER — Other Ambulatory Visit: Payer: Self-pay

## 2024-03-18 ENCOUNTER — Other Ambulatory Visit: Payer: Self-pay | Admitting: Gastroenterology

## 2024-03-18 ENCOUNTER — Ambulatory Visit (INDEPENDENT_AMBULATORY_CARE_PROVIDER_SITE_OTHER): Admitting: Gastroenterology

## 2024-03-18 ENCOUNTER — Encounter: Payer: Self-pay | Admitting: Gastroenterology

## 2024-03-18 ENCOUNTER — Telehealth: Payer: Self-pay

## 2024-03-18 VITALS — BP 137/75 | HR 87 | Temp 97.6°F | Ht 64.0 in | Wt 170.5 lb

## 2024-03-18 DIAGNOSIS — K5100 Ulcerative (chronic) pancolitis without complications: Secondary | ICD-10-CM | POA: Diagnosis not present

## 2024-03-18 NOTE — Telephone Encounter (Signed)
 Has appointment today with you. Do you want to refill or wait till appointment

## 2024-03-18 NOTE — Telephone Encounter (Signed)
 Patient states she forgot to ask you in the room but what are the side effects to the medication Tremfya

## 2024-03-18 NOTE — Progress Notes (Unsigned)
 Barbara Oz, MD [address]  [address]  [place], [place] [address]  Main: [phone]  Fax: [phone]    Gastroenterology Consultation  Referring Provider:     Eartha Gold, MD Primary Care Physician:  Barbara Gold, MD Primary Gastroenterologist:  Dr. Annella Kief Reason for Consultation: Ulcerative pancolitis        HPI:   Barbara Burgess is a 78 y.o. female referred by Dr. Harwood Burgess, Merri Abbe, MD  for consultation & management of left-sided UC.  Patient has history of ulcerative proctosigmoiditis since 2006 who was maintained in remission on Canasa , she was in histologic remission on 09/15/2018, followed by flareup, subsequently underwent colonoscopy in 02/2018 which revealed chronic mildly active proctosigmoiditis, since then she has been on Lialda  4.8 g daily.  She recently underwent repeat colonoscopy in 02/2020 due to flareup of diarrhea and abdominal cramps which revealed unremarkable right and left colon biopsies with no evidence of dysplasia.  Her symptoms resolved after colonoscopy.  However, for last 3 to 4 weeks patient has been experiencing several episodes of nonbloody diarrhea 5-6 times daily associated with abdominal cramps and bloating.  She denies any abdominal pain.  She denies weight loss.  Patient moved from [place] to [facility].  Therefore, she was referred by Dr. Eulah Burgess to me to establish care for her IBD Patient is active, exercises daily.  Her GI symptoms are interfering with her routine exercise  Follow-up visit 03/18/2024 Barbara Burgess is here to discuss about progression of her ulcerative colitis.  She was originally on low maintenance dose of mesalamine  for her left-sided ulcerative colitis.  She developed exacerbation of her symptoms in 12/2023, was seen by Barbara Canard, PA-C in our office.  Her stool studies were negative for infection.  Significantly elevated fecal calprotectin levels to 500s.  Therefore her Lialda  was  increased to 4 pills daily.  However, her diarrhea was not under control, I started her on Entocort which did not improve her diarrhea.  Subsequently, I started her on prednisone  and performed colonoscopy which revealed persistent left-sided colitis and pathology revealed chronic mild colitis in the right colon and moderate chronic active colitis in the left colon.  She is here today to discuss about further management of ulcerative colitis.  Prednisone  has significantly improved her symptoms, having formed bowel movements, denies any urgency, rectal bleeding or abdominal pain.  NSAIDs: None  Antiplts/Anticoagulants/Anti thrombotics: None  GI Procedures: Colonoscopy 03/06/2024 - The examined portion of the ileum was normal. Biopsied. - Mild ( Mayo Score 1) proctosigmoid ulcerative colitis, unchanged since the last examination. Biopsied. - Normal mucosa in the descending colon, in the transverse colon, in the ascending colon and in the cecum. Biopsied.      1. Terminal ileum, biopsy, cbx :      - UNREMARKABLE SMALL INTESTINAL MUCOSA.      - NEGATIVE FOR GRANULOMAS, DYSPLASIA, AND MALIGNANCY.       2. Cecum Biopsy, cbx :      - UNREMARKABLE COLONIC MUCOSA.      - NEGATIVE FOR GRANULOMAS, DYSPLASIA, AND MALIGNANCY.       3. Ascending Colon Biopsy, cbx :      - FOCAL MILD CHRONIC ACTIVE COLITIS INVOLVING A MINORITY OF THE BIOPSY FRAGMENTS      CONSISTENT WITH PATIENT'S KNOWN HISTORY OF ULCERATIVE COLITIS.      - NEGATIVE FOR GRANULOMAS, DYSPLASIA, AND MALIGNANCY.       4. Transverse Colon Biopsy, cbx :      -  FOCAL MILD CHRONIC ACTIVE COLITIS INVOLVING A MINORITY OF THE BIOPSY FRAGMENTS      CONSISTENT WITH PATIENT'S KNOWN HISTORY OF ULCERATIVE COLITIS.      - NEGATIVE FOR GRANULOMAS, DYSPLASIA, AND MALIGNANCY.       5. Descending Colon Biopsy, cbx :      - UNREMARKABLE COLONIC MUCOSA.      - NEGATIVE FOR GRANULOMAS, DYSPLASIA, AND MALIGNANCY.       6. Sigmoid Colon Biopsy,  :      -  MODERATE CHRONIC ACTIVE COLITIS INVOLVING A SUBSET OF THE BIOPSY FRAGMENTS      CONSISTENT WITH PATIENT'S KNOWN HISTORY OF ULCERATIVE COLITIS.      - NEGATIVE FOR GRANULOMAS, DYSPLASIA, AND MALIGNANCY.       7. Rectum, biopsy, cbx :      - MODERATE CHRONIC ACTIVE PROCTITIS CONSISTENT WITH PATIENT'S KNOWN HISTORY OF      ULCERATIVE PROCTOCOLITIS.      - NEGATIVE FOR GRANULOMAS, DYSPLASIA, AND MALIGNANCY.    Colonoscopy 02/25/20  - The examined portion of the ileum was normal.                        - Normal mucosa in the entire examined colon. Biopsied.                        - Diverticulosis in the sigmoid colon.                        No signs of active colitis.  A: Colon, ascending, biopsy Histologically-unremarkable colonic mucosa No granulomas, viral cytopathic effect, or dysplasia identified   B: Colon, descending, biopsy Histologically-unremarkable colonic mucosa No granulomas, viral cytopathic effect, or dysplasia identified   Past Medical History:  Diagnosis Date   Arthritis    knee and hip,  per [facility].    Bilateral bunions    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HCC)     per [facility].    Colitis    H/O CT scan of chest 11/27/2018   By Sharleen Dawley, per [facility].    H/O mammogram 11/27/2018   By Rockne Chyle at [facility], per [facility] Patient Packet.    History of CT scan of abdomen 11/27/2018   By Sharleen Dawley, per [facility].    History of upper extremity x-ray 11/27/2018   By Valarie Garner, of Shoulder, per [facility].    History of x-ray of hip 11/27/2018   By Dr.Kevin Krasinski, of Hip, per [facility] Patient Packet.    Hyperlipemia     per [facility].    Hypertension     per [facility].    Impingement syndrome of shoulder     per [facility] Packet.    Incontinence    Mild Nighttime per [facility] Patient Packet.   Insomnia    Lymphedema    Melanoma (HCC)     right lower leg; right ovary   Melanoma of skin (HCC) 09/13/2011   09/05/2011 Exploratory laparotomy and debulking of right pelvic sidewall mass.~ 2. Bilateral salpingo-oophorectomy.~ 3. Right pelvic lymph node sampling.~~~; Path: Right pelvic sidewall mass and right fallopian tube and ovary, excision; - Metastatic malignant melanoma involving matted lymph nodes with extracapsular; extension, size: 9.0 x 7.5 x 4.5 cm; ; Braf neg; ;  Stage III or stage IV melanoma  Osteoarthritis of multiple joints    Pulmonary nodule, right 12/12/2018   See on CT chest January 2020. Pt had recent URI.     Rosacea     per [facility] Patient Packet.    Sleep disorder     Past Surgical History:  Procedure Laterality Date   ABDOMINAL HYSTERECTOMY  11/28/1987   Partial per [facility] Packet   ARTHROSCOPIC REPAIR ACL Left 11/28/2003   Left Knee/ Torn Medial Meniscus per [facility] Patient Packet   BREAST BIOPSY     CATARACT EXTRACTION W/ INTRAOCULAR LENS IMPLANT Bilateral    COLONOSCOPY  11/27/2016   By Delanna Fears,  per [facility].    COLONOSCOPY  11/27/2017   By Delanna Fears, per [facility] Patient Packet.    COLONOSCOPY N/A 03/06/2024   Procedure: COLONOSCOPY;  Surgeon: Selena Daily, MD;  Location: [facility] ENDOSCOPY;  Service: Gastroenterology;  Laterality: N/A;   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   Procedure: TOTAL KNEE ARTHROPLASTY;  Surgeon: Rande Bushy, MD;  Location: [facility] ORS;  Service: Orthopedics;  Laterality: Left;   TUBAL LIGATION  1976   Current Outpatient Medications:    amLODipine  (NORVASC ) 2.5 MG tablet, Take 2.5 mg by mouth daily., Disp: , Rfl:    azelastine  (ASTELIN ) 0.1 % nasal  spray, Place 2 sprays into both nostrils daily., Disp: , Rfl:    CALCIUM  CARBONATE-VITAMIN D PO, Take 2 tablets by mouth daily., Disp: , Rfl:    Cyanocobalamin (B-12) 1000 MCG TABS, Take 1,000 mcg by mouth daily before breakfast., Disp: , Rfl:    Iron, Ferrous Sulfate, 325 (65 Fe) MG TABS, Take 365 mg by mouth daily., Disp: , Rfl:    Ivermectin 1 % CREA, Apply topically., Disp: , Rfl:    melatonin 5 MG TABS, Take 5 mg by mouth at bedtime., Disp: , Rfl:    minoxidil (LONITEN) 2.5 MG tablet, Take 2.5 mg by mouth daily., Disp: , Rfl:    predniSONE  (DELTASONE ) 10 MG tablet, Take 40 mg daily for 2 weeks, then 30 mg daily for 1 week, 20 mg daily for 1 week, 10 mg daily for 1 week, Disp: 100 tablet, Rfl: 0   rosuvastatin  (CRESTOR ) 10 MG tablet, Take 1 tablet (10 mg total) by mouth daily. (Patient taking differently: Take 10 mg by mouth every evening.), Disp: 90 tablet, Rfl: 3   traZODone  (DESYREL ) 100 MG tablet, Take 1 tablet (100 mg total) by mouth at bedtime., Disp: 90 tablet, Rfl: 3   triamcinolone ointment (KENALOG) 0.1 %, Apply 1 Application topically 2 (two) times daily., Disp: , Rfl:    venlafaxine (EFFEXOR) 75 MG tablet, Take 75 mg by mouth 2 (two) times daily., Disp: , Rfl:    mesalamine  (LIALDA ) 1.2 g EC tablet, Take 2.4 g by mouth daily., Disp: , Rfl:     Family History  Problem Relation Age of Onset   Alzheimer's disease Mother    Lung cancer Maternal Grandfather    Heart attack Paternal Grandfather    Diabetes Neg Hx    Breast cancer Neg Hx      Social History   Tobacco  Use   Smoking status: Former    Current packs/day: 0.00    Types: Cigarettes    Quit date: 2006    Years since quitting: 19.3   Smokeless tobacco: Never  Vaping Use   Vaping status: Never Used  Substance Use Topics   Alcohol use: Yes    Comment: occasional   Drug use: Never    Allergies as of 03/18/2024 - Review Complete 03/18/2024  Allergen Reaction Noted   Lactose Diarrhea 01/28/2024   Other  Diarrhea 12/25/2022   Atorvastatin  01/13/2017    Review of Systems:    All systems reviewed and negative except where noted in HPI.   Physical Exam:  BP 137/75 (BP Location: Left Arm, Patient Position: Sitting, Cuff Size: Normal)   Pulse 87   Temp 97.6 F (36.4 C) (Oral)   Ht 5\' 4"  (1.626 m)   Wt 170 lb 8 Burgess (77.3 kg)   BMI 29.27 kg/m  No LMP recorded. Patient has had a hysterectomy.  General:   Alert,  Well-developed, well-nourished, pleasant and cooperative in NAD Head:  Normocephalic and atraumatic. Eyes:  Sclera clear, no icterus.   Conjunctiva pink. Ears:  Normal auditory acuity. Nose:  No deformity, discharge, or lesions. Mouth:  No deformity or lesions,oropharynx pink & moist. Neck:  Supple; no masses or thyromegaly. Lungs:  Respirations even and unlabored.  Clear throughout to auscultation.   No wheezes, crackles, or rhonchi. No acute distress. Heart:  Regular rate and rhythm; no murmurs, clicks, rubs, or gallops. Abdomen:  Normal bowel sounds. Soft, non-tender and nondistended, patient without masses, hepatosplenomegaly or hernias noted.  No guarding or rebound tenderness.   Rectal: Not performed Msk:  Symmetrical without gross deformities. Good, equal movement & strength bilaterally. Pulses:  Normal pulses noted. Extremities:  No clubbing or edema.  No cyanosis. Neurologic:  Alert and oriented x3;  grossly normal neurologically. Skin:  Intact without significant lesions or rashes. No jaundice. Psych:  Alert and cooperative. Normal mood and affect.  Imaging Studies: Reviewed  Assessment and Plan:   BRIANAH HOPSON is a 78 y.o. female with history of left-sided UC, diagnosed in 2006, maintained on Lialda  4.8 g daily resulted in histologic remission has been maintained on 2 pills daily until 12/2023 followed by exacerbation of her symptoms.  Elevated fecal calprotectin levels, stool studies negative for infection, started on budesonide  9 mL with partial response,  colonoscopy revealed ulcerative pancolitis, mild to moderately active with chronicity, started on prednisone , responded well  Left-sided UC progressed to ulcerative pancolitis on mesalamine  maintenance therapy Continue prednisone  40 mg for 2 weeks total followed by quick taper Today, have discussed with patient regarding biologic therapies in order to control the inflammation Discussed about anti-TNF agents, antiinterleukin agents as well as antiintegration therapies.  Also discussed about small molecules.  Patient already did her own research and provided me with information about preferred Biologics in the formulary according to her health insurance.  She preferred injectable over infusion medication that is available in her formulary Discussed about guselkumab which is an anti-IL 23 agent Discussed about risks and benefits of this medication including but not limited to infusion reaction, injection site reaction, allergic reaction, anemia, elevated liver enzymes, risk of infection Check hepatitis A, B and C serologies Check QuantiFERON Burgess Will apply to her insurance for approval on guselkumab   Follow up in 3 months   Barbara Oz, MD

## 2024-03-19 ENCOUNTER — Encounter: Payer: Self-pay | Admitting: Gastroenterology

## 2024-03-20 ENCOUNTER — Telehealth: Payer: Self-pay

## 2024-03-20 NOTE — Telephone Encounter (Signed)
 Faxed paper work for the [employer] with me for the enrollment form. Faxed with the form Demographics, insurance, Medication list, Last office visit, Labs, path results, colonoscopy report. Faxed to [phone] and got confirmation fax went through. Faxed also new start paper work to [facility] for induction and [employer] for the Maintenance.  Faxed with both Demographics, insurance, Medication list, Last office visit, Labs, path results, colonoscopy report. Emailed Terri Fester to let her know the forms were coming her way.

## 2024-03-22 LAB — QUANTIFERON-TB GOLD PLUS
QuantiFERON Mitogen Value: 2.08 [IU]/mL
QuantiFERON Nil Value: 0.07 [IU]/mL
QuantiFERON TB1 Ag Value: 0.06 [IU]/mL
QuantiFERON TB2 Ag Value: 0.05 [IU]/mL
QuantiFERON-TB Gold Plus: NEGATIVE

## 2024-03-22 LAB — HEPATITIS B SURFACE ANTIGEN: Hepatitis B Surface Ag: NEGATIVE

## 2024-03-22 LAB — HEPATITIS A ANTIBODY, TOTAL: hep A Total Ab: POSITIVE — AB

## 2024-03-22 LAB — HEPATITIS C ANTIBODY: Hep C Virus Ab: NONREACTIVE

## 2024-03-22 LAB — HEPATITIS B SURFACE ANTIBODY,QUALITATIVE: Hep B Surface Ab, Qual: NONREACTIVE

## 2024-03-22 LAB — HEPATITIS B CORE ANTIBODY, TOTAL: Hep B Core Total Ab: NEGATIVE

## 2024-03-24 ENCOUNTER — Other Ambulatory Visit: Payer: Self-pay

## 2024-03-24 ENCOUNTER — Telehealth: Payer: Self-pay

## 2024-03-24 NOTE — Telephone Encounter (Signed)
 Patient states that [facility] is sending her in the mail now the maintenance medication and should be receiving this week. She told them she did need it at this time. She states she will keep it in the fridge till then. She states she talk to the Infusion side and they told her it looks like she was going to get approved for home infusions.

## 2024-03-24 NOTE — Telephone Encounter (Signed)
 Patient verbalized understanding of results. She will come get the Hepatitis B vaccine on Thursday.

## 2024-03-24 NOTE — Telephone Encounter (Signed)
-----   Message from [facility] sent at 03/24/2024 11:18 AM EDT ----- Please inform patient that she is immune to hepatitis A and recommend hepatitis B vaccine.  QuantiFERON gold came back negative She should be good to start Tremfya once approved  RV

## 2024-03-27 ENCOUNTER — Ambulatory Visit (INDEPENDENT_AMBULATORY_CARE_PROVIDER_SITE_OTHER): Admitting: Gastroenterology

## 2024-03-27 DIAGNOSIS — Z23 Encounter for immunization: Secondary | ICD-10-CM

## 2024-03-27 NOTE — Telephone Encounter (Signed)
 Patient states she was able to get the injection stop from being shipped and they have put the medication on hold till she needs it.

## 2024-03-27 NOTE — Progress Notes (Signed)
 Gave Hepatitis b vaccine in left Deltoid. Patient tolerated the procedure okay with no side  effects

## 2024-03-28 ENCOUNTER — Encounter: Payer: Self-pay | Admitting: Gastroenterology

## 2024-03-31 NOTE — Telephone Encounter (Signed)
 Per Terri Fester with [facility] Barbara Burgess is scheduled below for 5/12 for her first infusion

## 2024-04-07 ENCOUNTER — Encounter: Payer: Self-pay | Admitting: Gastroenterology

## 2024-04-12 ENCOUNTER — Other Ambulatory Visit: Payer: Self-pay | Admitting: Gastroenterology

## 2024-04-12 DIAGNOSIS — K5100 Ulcerative (chronic) pancolitis without complications: Secondary | ICD-10-CM

## 2024-04-12 MED ORDER — PREDNISONE 10 MG PO TABS: 20.0000 mg | ORAL_TABLET | Freq: Every day | ORAL | 0 refills | Status: DC

## 2024-04-12 MED ORDER — PREDNISONE 10 MG PO TABS: 20.0000 mg | ORAL_TABLET | Freq: Every day | ORAL | 0 refills | Status: AC

## 2024-04-13 ENCOUNTER — Encounter: Payer: Self-pay | Admitting: Gastroenterology

## 2024-04-15 ENCOUNTER — Encounter (INDEPENDENT_AMBULATORY_CARE_PROVIDER_SITE_OTHER): Payer: Self-pay

## 2024-04-24 ENCOUNTER — Ambulatory Visit (INDEPENDENT_AMBULATORY_CARE_PROVIDER_SITE_OTHER)

## 2024-04-24 ENCOUNTER — Telehealth: Payer: Self-pay

## 2024-04-24 DIAGNOSIS — Z23 Encounter for immunization: Secondary | ICD-10-CM

## 2024-04-24 NOTE — Progress Notes (Signed)
 Per orders of Dr.Vanga, injection of 2 of 3 Hep B given in Left Deltoid by Brenna Friesenhahn Y.  Patient tolerated injection well.

## 2024-04-24 NOTE — Telephone Encounter (Signed)
 Pt had her 2 of 3 hep B today. She had question regarding when she completes steroid treatment, can she proceed with covid booster shot.... Please advise

## 2024-04-25 ENCOUNTER — Encounter: Payer: Self-pay | Admitting: Gastroenterology

## 2024-04-29 ENCOUNTER — Encounter: Payer: Self-pay | Admitting: Gastroenterology

## 2024-05-09 ENCOUNTER — Other Ambulatory Visit: Payer: Self-pay | Admitting: Gastroenterology

## 2024-05-09 ENCOUNTER — Encounter: Payer: Self-pay | Admitting: Gastroenterology

## 2024-05-09 DIAGNOSIS — K5100 Ulcerative (chronic) pancolitis without complications: Secondary | ICD-10-CM

## 2024-10-02 ENCOUNTER — Ambulatory Visit

## 2024-10-02 DIAGNOSIS — Z23 Encounter for immunization: Secondary | ICD-10-CM

## 2024-10-02 NOTE — Progress Notes (Signed)
 Per original orders of Dr. Unk in care of Dr Jinny, injection 3 of 3 Hep B given in Right Deltoid by Lynsee Wands Y.  Patient tolerated injection well.

## 2024-10-15 ENCOUNTER — Ambulatory Visit: Admitting: Dietician

## 2024-10-15 ENCOUNTER — Encounter: Payer: Self-pay | Admitting: Gastroenterology

## 2024-10-16 ENCOUNTER — Encounter: Payer: Self-pay | Admitting: Gastroenterology

## 2024-10-16 ENCOUNTER — Encounter: Payer: Self-pay | Admitting: Anesthesiology

## 2024-10-16 ENCOUNTER — Encounter
Admission: RE | Disposition: A | Discharge status: Home / Self Care | Payer: Self-pay | Source: Home / Self Care | Attending: Gastroenterology

## 2024-10-16 ENCOUNTER — Ambulatory Visit
Admission: RE | Admit: 2024-10-16 | Discharge: 2024-10-16 | Disposition: A | Discharge status: Home / Self Care | Attending: Gastroenterology | Admitting: Gastroenterology

## 2024-10-16 DIAGNOSIS — K5100 Ulcerative (chronic) pancolitis without complications: Secondary | ICD-10-CM | POA: Diagnosis present

## 2024-10-16 DIAGNOSIS — K5730 Diverticulosis of large intestine without perforation or abscess without bleeding: Secondary | ICD-10-CM | POA: Diagnosis not present

## 2024-10-16 DIAGNOSIS — K6389 Other specified diseases of intestine: Secondary | ICD-10-CM | POA: Diagnosis not present

## 2024-10-16 HISTORY — PX: FLEXIBLE SIGMOIDOSCOPY: SHX5431

## 2024-10-16 SURGERY — SIGMOIDOSCOPY, FLEXIBLE

## 2024-10-16 NOTE — Anesthesia Preprocedure Evaluation (Addendum)
 Anesthesia Evaluation  Patient identified by MRN, date of birth, ID band Patient awake    Reviewed: Allergy & Precautions, H&P , NPO status , Patient's Chart, lab work & pertinent test results  Airway Mallampati: II  TM Distance: >3 FB Neck ROM: Full    Dental no notable dental hx. (+) Chipped Very small chip right upper central incisor and even tinier chip left upper central incisor :   Pulmonary neg pulmonary ROS, former smoker   Pulmonary exam normal breath sounds clear to auscultation       Cardiovascular hypertension, negative cardio ROS Normal cardiovascular exam Rhythm:Regular Rate:Normal     Neuro/Psych negative neurological ROS  negative psych ROS   GI/Hepatic negative GI ROS, Neg liver ROS, PUD,,,  Endo/Other  negative endocrine ROS    Renal/GU negative Renal ROS  negative genitourinary   Musculoskeletal negative musculoskeletal ROS (+) Arthritis ,    Abdominal   Peds negative pediatric ROS (+)  Hematology negative hematology ROS (+)   Anesthesia Other Findings Medical History  Colitis  Insomnia 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HCC)  Melanoma (HCC) Sleep disorder  Bilateral bunions Rosacea  Impingement syndrome of shoulder Incontinence  Arthritis  Osteoarthritis of multiple joints Hyperlipemia  Hypertension Melanoma of skin (HCC)  Pulmonary nodule, right Lymphedema  Ulcerative chronic pancolitis    Reproductive/Obstetrics negative OB ROS                              Anesthesia Physical Anesthesia Plan  ASA: 3  Anesthesia Plan: General   Post-op Pain Management:    Induction: Intravenous  PONV Risk Score and Plan:   Airway Management Planned: Natural Airway and Nasal Cannula  Additional Equipment:   Intra-op Plan:   Post-operative Plan:   Informed Consent: I have reviewed the patients History and Physical, chart, labs and discussed the procedure  including the risks, benefits and alternatives for the proposed anesthesia with the patient or authorized representative who has indicated his/her understanding and acceptance.     Dental Advisory Given  Plan Discussed with: Anesthesiologist, CRNA and Surgeon  Anesthesia Plan Comments: (Patient consented for risks of anesthesia including but not limited to:  - adverse reactions to medications - risk of airway placement if required - damage to eyes, teeth, lips or other oral mucosa - nerve damage due to positioning  - sore throat or hoarseness - Damage to heart, brain, nerves, lungs, other parts of body or loss of life  Patient voiced understanding and assent.)         Anesthesia Quick Evaluation

## 2024-10-16 NOTE — Op Note (Signed)
 [facility] Gastroenterology Patient Name: Barbara Burgess Procedure Date: 10/16/2024 11:27 AM MRN: [id number] Account #: [id number] Date of Birth: 1946-11-26 Admit Type: Outpatient Age: 78 Room: [facility] ENDO ROOM 3 Gender: Female Note Status: Finalized Instrument Name: Endoscope [id number] Procedure:             Flexible Sigmoidoscopy Indications:           Follow-up of chronic ulcerative pancolitis, Disease                         activity assessment of chronic ulcerative pancolitis,                         Assess therapeutic response to therapy of chronic                         ulcerative pancolitis Providers:             Corinn Jess Brooklyn MD, MD Referring MD:          Harlene POUR. Caro (Referring MD) Medicines:             None Complications:         No immediate complications. Estimated blood loss: None. Procedure:             Pre-Anesthesia Assessment:                        - Prior to the procedure, a History and Physical was                         performed, and patient medications and allergies were                         reviewed. The patient is competent. The risks and                         benefits of the procedure and the sedation options and                         risks were discussed with the patient. All questions                         were answered and informed consent was obtained.                         Patient identification and proposed procedure were                         verified by the physician, the nurse, the                         anesthesiologist, the anesthetist and the technician                         in the pre-procedure area in the procedure room in the                         endoscopy suite. Mental Status Examination: normal.  Airway Examination: normal oropharyngeal airway and                         neck mobility. Respiratory Examination: clear to                         auscultation. CV  Examination: normal. Prophylactic                         Antibiotics: The patient does not require prophylactic                         antibiotics. Prior Anticoagulants: The patient has                         taken no anticoagulant or antiplatelet agents. ASA                         Grade Assessment: II - A patient with mild systemic                         disease. After reviewing the risks and benefits, the                         patient was deemed in satisfactory condition to                         undergo the procedure. The anesthesia plan was to use                         no sedation or anesthesia. Immediately prior to                         administration of medications, the patient was                         re-assessed for adequacy to receive sedatives. The                         heart rate, respiratory rate, oxygen saturations,                         blood pressure, adequacy of pulmonary ventilation, and                         response to care were monitored throughout the                         procedure. The physical status of the patient was                         re-assessed after the procedure.                        After obtaining informed consent, the scope was passed                         under direct vision. The Endoscope was introduced  through the anus and advanced to the the sigmoid                         colon. The flexible sigmoidoscopy was accomplished                         without difficulty. The patient tolerated the                         procedure fairly well. The quality of the bowel                         preparation was fair. Findings:      The perianal and digital rectal examinations were normal. Pertinent       negatives include normal sphincter tone and no palpable rectal lesions.      A patchy area of mildly erythematous mucosa was found in the       recto-sigmoid colon and in the sigmoid colon. Biopsies were  taken with a       cold forceps for histology. Impression:            - Preparation of the colon was fair.                        - Erythematous mucosa in the recto-sigmoid colon and                         in the sigmoid colon. Biopsied. Recommendation:        - Discharge patient to home (with escort).                        - Resume previous diet today.                        - Await pathology results.                        - Perform a colonoscopy tomorrow. Procedure Code(s):     --- Professional ---                        [phone], Sigmoidoscopy, flexible; with biopsy, single or                         multiple Diagnosis Code(s):     --- Professional ---                        K63.89, Other specified diseases of intestine                        K51.00, Ulcerative (chronic) pancolitis without                         complications CPT copyright 2022 American Medical Association. All rights reserved. The codes documented in this report are preliminary and upon coder review may  be revised to meet current compliance requirements. Dr. Corinn Brooklyn Corinn Jess Brooklyn MD, MD 10/16/2024 11:49:00 AM This report has been signed electronically. Number of Addenda: 0 Note Initiated On: 10/16/2024 11:27 AM Estimated Blood Loss:  Estimated blood loss:  none.      [facility]

## 2024-10-16 NOTE — H&P (Signed)
 Barbara JONELLE Brooklyn, MD [facility], DHIP [address]  [place], [place] [address]  Main: [phone] Fax:  [phone] Pager: [phone]   Primary Care Physician:  Epifanio Alm SQUIBB, MD Primary Gastroenterologist:  Dr. Corinn JONELLE Burgess  Pre-Procedure History & Physical: HPI:  Barbara Burgess is a 78 y.o. female is here for an flexible sigmoidoscopy.   Past Medical History:  Diagnosis Date   Arthritis    knee and hip,  per [facility] New Patient Packet.    Bilateral bunions    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HCC)     per [facility] New Patient Packet.    Colitis    H/O CT scan of chest 11/27/2018   By Cathlean Hoyles, per [facility] New Patient Packet.    H/O mammogram 11/27/2018   By Ladon at [facility], per [facility] New Patient Packet.    History of CT scan of abdomen 11/27/2018   By Cathlean Hoyles, per [facility] New Patient Packet.    History of upper extremity x-ray 11/27/2018   By Mira Cranker, of Shoulder, per [facility] New Patient Packet.    History of x-ray of hip 11/27/2018   By Dr.Kevin Krasinski, of Hip, per [facility] New Patient Packet.    Hyperlipemia     per [facility] New Patient Packet.    Hypertension     per [facility] New Patient Packet.    Impingement syndrome of shoulder     per [facility] New Patient Packet.    Incontinence    Mild Nighttime per [facility] New Patient Packet.   Insomnia    Lymphedema    Melanoma (HCC)    right lower leg; right ovary   Melanoma of skin (HCC) 09/13/2011   09/05/2011 Exploratory laparotomy and debulking of right pelvic sidewall mass.~ 2. Bilateral salpingo-oophorectomy.~ 3. Right pelvic lymph node sampling.~~~; Path: Right pelvic sidewall mass and right fallopian tube and ovary, excision; - Metastatic malignant melanoma involving matted lymph nodes with extracapsular; extension, size: 9.0 x 7.5 x 4.5 cm; ; Braf neg; ;  Stage III or stage IV melanoma   Osteoarthritis of multiple joints    Pulmonary nodule, right 12/12/2018   See on CT chest January  2020. Pt had recent URI.     Rosacea     per [facility] New Patient Packet.    Sleep disorder     Past Surgical History:  Procedure Laterality Date   ABDOMINAL HYSTERECTOMY  11/28/1987   Partial per [facility] New Patient Packet   ARTHROSCOPIC REPAIR ACL Left 11/28/2003   Left Knee/ Torn Medial Meniscus per [facility] New Patient Packet   BREAST BIOPSY     CATARACT EXTRACTION W/ INTRAOCULAR LENS IMPLANT Bilateral    COLONOSCOPY  11/27/2016   By Olam Henle,  per [facility] New Patient Packet.    COLONOSCOPY  11/27/2017   By Olam Henle, per [facility] New Patient Packet.    COLONOSCOPY N/A 03/06/2024   Procedure: COLONOSCOPY;  Surgeon: Burgess Barbara Skiff, MD;  Location[facility] ENDOSCOPY;  Service: Gastroenterology;  Laterality: N/A;   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   Procedure: TOTAL KNEE ARTHROPLASTY;  Surgeon: Cranker Drivers, MD;  Location[facility]  ORS;  Service: Orthopedics;  Laterality: Left;   TUBAL LIGATION  1976    Prior to Admission medications   Medication Sig Start Date End Date Taking? Authorizing Provider  amLODipine  (NORVASC ) 2.5 MG tablet Take 2.5 mg by mouth daily.   Yes [provider]  azelastine  (ASTELIN ) 0.1 % nasal spray Place 2 sprays into both nostrils daily. 09/20/21  Yes [provider]  CALCIUM  CARBONATE-VITAMIN D PO Take 2 tablets by mouth daily.   Yes [provider]  Cyanocobalamin (B-12) 1000 MCG TABS Take 1,000 mcg by mouth daily before breakfast. 12/07/23  Yes [provider]  Iron, Ferrous Sulfate, 325 (65 Fe) MG TABS Take 365 mg by mouth daily. 12/07/23  Yes [provider]  Ivermectin 1 % CREA Apply topically.   Yes [provider]   melatonin 5 MG TABS Take 5 mg by mouth at bedtime. 12/07/23  Yes [provider]  minoxidil (LONITEN) 2.5 MG tablet Take 2.5 mg by mouth daily. 06/27/22  Yes [provider]  rosuvastatin  (CRESTOR ) 10 MG tablet Take 1 tablet (10 mg total) by mouth daily. Patient taking differently: Take 10 mg by mouth every evening. 01/28/20  Yes Caro Harlene POUR, NP  traZODone  (DESYREL ) 100 MG tablet Take 1 tablet (100 mg total) by mouth at bedtime. 01/27/20  Yes Eubanks, Jessica K, NP  venlafaxine (EFFEXOR) 75 MG tablet Take 75 mg by mouth 2 (two) times daily.   Yes [provider]  predniSONE  (DELTASONE ) 10 MG tablet Take 2 tablets (20 mg total) by mouth daily with breakfast. 04/12/24 05/12/24  Unk Barbara Skiff, MD  TREMFYA 100 MG/ML pen  03/20/24   [provider]    Allergies as of 10/06/2024 - Review Complete 10/02/2024  Allergen Reaction Noted   Lactose Diarrhea 01/28/2024   Other Diarrhea 12/25/2022   Atorvastatin  01/13/2017    Family History  Problem Relation Age of Onset   Alzheimer's disease Mother    Lung cancer Maternal Grandfather    Heart attack Paternal Grandfather    Diabetes Neg Hx    Breast cancer Neg Hx     Social History   Socioeconomic History   Marital status: Widowed    Spouse name: Not on file   Number of children: 2   Years of education: Not on file   Highest education level: Not on file  Occupational History   Occupation: Production Designer, Theatre/television/film    Comment: Retired  Tobacco Use   Smoking status: Former    Current packs/day: 0.00    Types: Cigarettes    Quit date: 2006    Years since quitting: 19.8   Smokeless tobacco: Never  Vaping Use   Vaping status: Never Used  Substance and Sexual Activity   Alcohol use: Yes    Comment: occasional   Drug use: Never   Sexual activity: Not on file  Other Topics Concern   Not on file  Social History Narrative   Husband was [place] minister---they spent time overseas teaching   Son in [place]    Daughter in [place]         Has living will   Son is health care POA----daughter is alternate   Would accept resuscitation attempts   Doesn't want tube feeds if cognitively unaware      Per [facility] New Patient Packet, abstracted 12/09/19      Diet: Patient states that she tries to have a diet rich in fruit, vegetables, and whole grains. But needs to modify diet  because of colitis.       Caffeine: Yes      Married, if yes what year: Widowed/ Married in 1968      Do you live in a house, apartment, assisted living, [place], trailer, ect: [facility] [employer] [facility], 1 person      Pets: 1 Dog   Highest Level of Education completed? B.A.      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       Exercise: Yes, daily walking          Living Will: Yes   DNR: Yes   POA/HPOA: Yes       Functional Status:   Do you have difficulty bathing or dressing yourself? No    Do you have difficulty preparing food or eating? No   Do you have difficulty managing your medications? No    Do you have difficulty managing your finances? No    Do you have difficulty affording your medications? No   Social Drivers of Corporate Investment Banker Strain: Low Risk  (10/14/2024)   Received from [facility]   Overall Financial Resource Strain (CARDIA)    Difficulty of Paying Living Expenses: Not hard at all  Food Insecurity: No Food Insecurity (10/14/2024)   Received from [facility]   Hunger Vital Sign    Within the past 12 months, you worried that your food would run out before you got the money to buy more.: Never true    Within the past 12 months, the food you bought just didn't last and you didn't have money to get more.: Never true  Transportation Needs: No Transportation Needs (10/14/2024)   Received from [facility] - Transportation    In the past 12  months, has lack of transportation kept you from medical appointments or from getting medications?: No    Lack of Transportation (Non-Medical): No  Physical Activity: Not on file  Stress: Not on file  Social Connections: Not on file  Intimate Partner Violence: Not on file    Review of Systems: See HPI, otherwise negative ROS  Physical Exam: BP (!) 140/62   Pulse 66   Temp (!) 96.9 F (36.1 C) (Temporal)   Resp 18   Ht 5' 4 (1.626 m)   Wt 78.2 kg   SpO2 97%   BMI 29.59 kg/m  General:   Alert,  pleasant and cooperative in NAD Head:  Normocephalic and atraumatic. Neck:  Supple; no masses or thyromegaly. Lungs:  Clear throughout to auscultation.    Heart:  Regular rate and rhythm. Abdomen:  Soft, nontender and nondistended. Normal bowel sounds, without guarding, and without rebound.   Neurologic:  Alert and  oriented x4;  grossly normal neurologically.  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  Questions have been answered.  All parties agreeable.   Barbara Brooklyn, MD  10/16/2024, 11:28 AM

## 2024-10-17 ENCOUNTER — Other Ambulatory Visit: Payer: Self-pay

## 2024-10-17 ENCOUNTER — Encounter
Admission: RE | Disposition: A | Discharge status: Home / Self Care | Payer: Self-pay | Source: Home / Self Care | Attending: Gastroenterology

## 2024-10-17 ENCOUNTER — Encounter: Payer: Self-pay | Admitting: Anesthesiology

## 2024-10-17 ENCOUNTER — Ambulatory Visit
Admission: RE | Admit: 2024-10-17 | Discharge: 2024-10-17 | Disposition: A | Discharge status: Home / Self Care | Attending: Gastroenterology | Admitting: Gastroenterology

## 2024-10-17 ENCOUNTER — Ambulatory Visit: Payer: Self-pay | Admitting: Anesthesiology

## 2024-10-17 ENCOUNTER — Encounter: Payer: Self-pay | Admitting: Gastroenterology

## 2024-10-17 DIAGNOSIS — Z8711 Personal history of peptic ulcer disease: Secondary | ICD-10-CM | POA: Insufficient documentation

## 2024-10-17 DIAGNOSIS — I10 Essential (primary) hypertension: Secondary | ICD-10-CM | POA: Insufficient documentation

## 2024-10-17 DIAGNOSIS — K5100 Ulcerative (chronic) pancolitis without complications: Secondary | ICD-10-CM | POA: Insufficient documentation

## 2024-10-17 DIAGNOSIS — Z87891 Personal history of nicotine dependence: Secondary | ICD-10-CM | POA: Diagnosis not present

## 2024-10-17 HISTORY — PX: COLONOSCOPY: SHX5424

## 2024-10-17 HISTORY — DX: Ulcerative (chronic) pancolitis without complications: K51.00

## 2024-10-17 LAB — SURGICAL PATHOLOGY

## 2024-10-17 SURGERY — COLONOSCOPY
Anesthesia: General

## 2024-10-17 MED ORDER — STERILE WATER FOR IRRIGATION IR SOLN
Status: DC | PRN
  Administered 2024-10-17: 1

## 2024-10-17 MED ORDER — SODIUM CHLORIDE 0.9 % IV SOLN: INTRAVENOUS | Status: DC

## 2024-10-17 MED ORDER — LACTATED RINGERS IV SOLN: INTRAVENOUS | Status: DC

## 2024-10-17 MED ORDER — PROPOFOL 10 MG/ML IV BOLUS
INTRAVENOUS | Status: AC
Start: 2024-10-17 — End: 2024-10-17
  Filled 2024-10-17: qty 40

## 2024-10-17 MED ORDER — PROPOFOL 10 MG/ML IV BOLUS
INTRAVENOUS | Status: DC | PRN
  Administered 2024-10-17: 60 mg via INTRAVENOUS
  Administered 2024-10-17: 50 mg via INTRAVENOUS
  Administered 2024-10-17: 90 mg via INTRAVENOUS

## 2024-10-17 MED ORDER — LIDOCAINE HCL (CARDIAC) PF 100 MG/5ML IV SOSY
PREFILLED_SYRINGE | INTRAVENOUS | Status: DC | PRN
  Administered 2024-10-17: 50 mg via INTRAVENOUS

## 2024-10-17 SURGICAL SUPPLY — 5 items
FORCEPS BIOP RAD 4 LRG CAP 4 (CUTTING FORCEPS) IMPLANT
GOWN CVR UNV OPN BCK APRN NK (MISCELLANEOUS) ×2 IMPLANT
KIT PROCEDURE OLYMPUS (MISCELLANEOUS) ×1 IMPLANT
MANIFOLD NEPTUNE II (INSTRUMENTS) ×1 IMPLANT
WATER STERILE IRR 250ML POUR (IV SOLUTION) ×1 IMPLANT

## 2024-10-17 NOTE — Transfer of Care (Signed)
 Immediate Anesthesia Transfer of Care Note  Patient: Barbara Burgess  Procedure(s) Performed: COLONOSCOPY  Patient Location: PACU  Anesthesia Type: General  Level of Consciousness: awake, alert  and patient cooperative  Airway and Oxygen Therapy: Patient Spontanous Breathing and Patient connected to supplemental oxygen  Post-op Assessment: Post-op Vital signs reviewed, Patient's Cardiovascular Status Stable, Respiratory Function Stable, Patent Airway and No signs of Nausea or vomiting  Post-op Vital Signs: Reviewed and stable  Complications: No notable events documented.

## 2024-10-17 NOTE — Op Note (Addendum)
 [facility] Patient Name: Barbara Burgess Procedure Date: 10/17/2024 9:59 AM MRN: [id number] Account #: [id number] Date of Birth: 1946/06/17 Admit Type: Outpatient Age: 78 Room: [facility] OR ROOM 01 Gender: Female Note Status: Finalized Instrument Name: Peds Colonoscope [id number] Procedure:             Colonoscopy Indications:           Last colonoscopy: April 2025, Chronic ulcerative                         pancolitis, Follow-up of chronic ulcerative                         pancolitis, Disease activity assessment of chronic                         ulcerative pancolitis, Assess therapeutic response to                         therapy of chronic ulcerative pancolitis Providers:             Corinn Jess Brooklyn MD, MD Referring MD:          Alm Needle (Referring MD) Medicines:             General Anesthesia Complications:         No immediate complications. Estimated blood loss: None. Procedure:             Pre-Anesthesia Assessment:                        - Prior to the procedure, a History and Physical was                         performed, and patient medications and allergies were                         reviewed. The patient is competent. The risks and                         benefits of the procedure and the sedation options and                         risks were discussed with the patient. All questions                         were answered and informed consent was obtained.                         Patient identification and proposed procedure were                         verified by the physician, the nurse, the                         anesthesiologist, the anesthetist and the technician                         in the pre-procedure area in the procedure room in the  endoscopy suite. Mental Status Examination: alert and                         oriented. Airway Examination: normal oropharyngeal                         airway  and neck mobility. Respiratory Examination:                         clear to auscultation. CV Examination: normal.                         Prophylactic Antibiotics: The patient does not require                         prophylactic antibiotics. Prior Anticoagulants: The                         patient has taken no anticoagulant or antiplatelet                         agents. ASA Grade Assessment: III - A patient with                         severe systemic disease. After reviewing the risks and                         benefits, the patient was deemed in satisfactory                         condition to undergo the procedure. The anesthesia                         plan was to use general anesthesia. Immediately prior                         to administration of medications, the patient was                         re-assessed for adequacy to receive sedatives. The                         heart rate, respiratory rate, oxygen saturations,                         blood pressure, adequacy of pulmonary ventilation, and                         response to care were monitored throughout the                         procedure. The physical status of the patient was                         re-assessed after the procedure.                        After obtaining informed consent, the colonoscope was  passed under direct vision. Throughout the procedure,                         the patient's blood pressure, pulse, and oxygen                         saturations were monitored continuously. The                         Colonoscope was introduced through the anus and                         advanced to the the terminal ileum, with                         identification of the appendiceal orifice and IC                         valve. The colonoscopy was performed without                         difficulty. The patient tolerated the procedure well.                         The quality of  the bowel preparation was evaluated                         using the BBPS [facility] Bowel Preparation Scale) with                         scores of: Right Colon = 3, Transverse Colon = 3 and                         Left Colon = 3 (entire mucosa seen well with no                         residual staining, small fragments of stool or opaque                         liquid). The total BBPS score equals 9. The terminal                         ileum, ileocecal valve, appendiceal orifice, and                         rectum were photographed. Findings:      The perianal and digital rectal examinations were normal. Pertinent       negatives include normal sphincter tone and no palpable rectal lesions.      The terminal ileum appeared normal.      Inflammation was found in a continuous and circumferential pattern from       the rectum to the cecum. This was graded as Mayo Score 1 (mild, with       erythema, decreased vascular pattern, mild friability). Biopsies were       taken with a cold forceps for histology.      The retroflexed view of the distal rectum and anal verge was normal and  showed no anal or rectal abnormalities. Impression:            - The examined portion of the ileum was normal.                        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                         Biopsied.                        - The distal rectum and anal verge are normal on                         retroflexion view. Recommendation:        - Discharge patient to home (with escort).                        - Resume previous diet today.                        - Continue present medications.                        - Await pathology results.                        - Return to my office as previously scheduled. Procedure Code(s):     --- Professional ---                        [phone], Colonoscopy, flexible; with biopsy, single or                         multiple Diagnosis Code(s):     ---  Professional ---                        K51.00, Ulcerative (chronic) pancolitis without                         complications CPT copyright 2022 American Medical Association. All rights reserved. The codes documented in this report are preliminary and upon coder review may  be revised to meet current compliance requirements. Dr. Corinn Brooklyn Corinn Jess Brooklyn MD, MD 10/17/2024 10:31:01 AM This report has been signed electronically. Number of Addenda: 0 Note Initiated On: 10/17/2024 9:59 AM Scope Withdrawal Time: 0 hours 13 minutes 27 seconds  Total Procedure Duration: 0 hours 16 minutes 16 seconds  Estimated Blood Loss:  Estimated blood loss: none.      [facility]

## 2024-10-17 NOTE — Anesthesia Postprocedure Evaluation (Signed)
 Anesthesia Post Note  Patient: Barbara Burgess  Procedure(s) Performed: COLONOSCOPY  Patient location during evaluation: PACU Anesthesia Type: General Level of consciousness: awake and alert Pain management: pain level controlled Vital Signs Assessment: post-procedure vital signs reviewed and stable Respiratory status: spontaneous breathing, nonlabored ventilation, respiratory function stable and patient connected to nasal cannula oxygen Cardiovascular status: blood pressure returned to baseline and stable Postop Assessment: no apparent nausea or vomiting Anesthetic complications: no   No notable events documented.   Last Vitals:  Vitals:   10/17/24 1047 10/17/24 1048  BP: 129/64   Pulse: 68 71  Resp: 18 14  Temp: (!) 36.3 C   SpO2: (!) 88% 100%    Last Pain:  Vitals:   10/17/24 1047  TempSrc:   PainSc: 0-No pain                 Mccall Will C Brevon Dewald

## 2024-10-17 NOTE — H&P (Signed)
 Barbara JONELLE Brooklyn, MD [facility], DHIP [address]  [place], [place] [address]  Main: [phone] Fax:  [phone] Pager: [phone]   Primary Care Physician:  Barbara Alm SQUIBB, MD Primary Gastroenterologist:  Dr. Corinn JONELLE Burgess  Pre-Procedure History & Physical: HPI:  Barbara Burgess is a 78 y.o. female is here for an colonoscopy.   Past Medical History:  Diagnosis Date   Arthritis    knee and hip,  per [facility] New Patient Packet.    Bilateral bunions    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HCC)     per [facility] New Patient Packet.    Colitis    H/O CT scan of chest 11/27/2018   By Cathlean Hoyles, per [facility]sc New Patient Packet.    H/O mammogram 11/27/2018   By Ladon at [facility], per psc New Patient Packet.    History of CT scan of abdomen 11/27/2018   By Cathlean Hoyles, per psc New Patient Packet.    History of upper extremity x-ray 11/27/2018   By Mira Cranker, of Shoulder, per [facility] New Patient Packet.    History of x-ray of hip 11/27/2018   By Dr.Kevin Krasinski, of Hip, per [facility] New Patient Packet.    Hyperlipemia     per [facility] New Patient Packet.    Hypertension     per [facility] New Patient Packet.    Impingement syndrome of shoulder     per [facility] New Patient Packet.    Incontinence    Mild Nighttime per [facility] New Patient Packet.   Insomnia    Lymphedema    Melanoma (HCC)    right lower leg; right ovary   Melanoma of skin (HCC) 09/13/2011   09/05/2011 Exploratory laparotomy and debulking of right pelvic sidewall mass.~ 2. Bilateral salpingo-oophorectomy.~ 3. Right pelvic lymph node sampling.~~~; Path: Right pelvic sidewall mass and right fallopian tube and ovary, excision; - Metastatic malignant melanoma involving matted lymph nodes with extracapsular; extension, size: 9.0 x 7.5 x 4.5 cm; ; Braf neg; ;  Stage III or stage IV melanoma   Osteoarthritis of multiple joints    Pulmonary nodule, right 12/12/2018   See on CT chest January 2020. Pt had  recent URI.     Rosacea     per [facility] New Patient Packet.    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    COLONOSCOPY  11/27/2017   By Olam Henle, per [facility]sc New Patient Packet.    COLONOSCOPY N/A 03/06/2024   Procedure: COLONOSCOPY;  Surgeon: Burgess Barbara Skiff, MD;  Location: [facility] ENDOSCOPY;  Service: Gastroenterology;  Laterality: N/A;   FLEXIBLE SIGMOIDOSCOPY N/A 10/16/2024   Procedure: KINGSTON SIDE;  Surgeon: Burgess Barbara Skiff, MD;  Location: [facility] ENDOSCOPY;  Service: Gastroenterology;  Laterality: N/A;   HYSTEROTOMY  11/28/1987   per [facility] New Patient Packet   KNEE SURGERY     MELANOMA EXCISION  11/27/1998   Right Shin per [facility] New Patient Packet   OOPHORECTOMY  11/27/2010   along with melanoma mass per [facility] New Patient Packet  TONSILLECTOMY  11/27/1950   per [facility] New Patient Packet   TOTAL KNEE ARTHROPLASTY Left 12/20/2021   Procedure: TOTAL KNEE ARTHROPLASTY;  Surgeon: Marchia Drivers, MD;  Location: [facility] ORS;  Service: Orthopedics;  Laterality: Left;   TUBAL LIGATION  1976    Prior to Admission medications   Medication Sig Start Date End Date Taking? Authorizing Provider  amLODipine  (NORVASC ) 2.5 MG tablet Take 2.5 mg by mouth daily.   Yes [provider]  azelastine  (ASTELIN ) 0.1 % nasal spray Place 2 sprays into both nostrils daily. 09/20/21  Yes [provider]  CALCIUM  CARBONATE-VITAMIN D PO Take 2 tablets by mouth daily.   Yes [provider]  Cyanocobalamin (B-12) 1000 MCG TABS Take 1,000 mcg by mouth daily before breakfast. 12/07/23   Yes [provider]  Iron, Ferrous Sulfate, 325 (65 Fe) MG TABS Take 365 mg by mouth daily. 12/07/23  Yes [provider]  Ivermectin 1 % CREA Apply topically.   Yes [provider]  melatonin 5 MG TABS Take 5 mg by mouth at bedtime. 12/07/23  Yes [provider]  minoxidil (LONITEN) 2.5 MG tablet Take 2.5 mg by mouth daily. 06/27/22  Yes [provider]  rosuvastatin  (CRESTOR ) 10 MG tablet Take 1 tablet (10 mg total) by mouth daily. Patient taking differently: Take 10 mg by mouth every evening. 01/28/20  Yes Caro Harlene POUR, NP  traZODone  (DESYREL ) 100 MG tablet Take 1 tablet (100 mg total) by mouth at bedtime. 01/27/20  Yes Caro Harlene POUR, NP  TREMFYA 100 MG/ML pen  03/20/24  Yes [provider]  venlafaxine (EFFEXOR) 75 MG tablet Take 75 mg by mouth 2 (two) times daily.   Yes [provider]  predniSONE  (DELTASONE ) 10 MG tablet Take 2 tablets (20 mg total) by mouth daily with breakfast. 04/12/24 05/12/24  Unk Barbara Skiff, MD    Allergies as of 10/16/2024 - Review Complete 10/16/2024  Allergen Reaction Noted   Lactose Diarrhea 01/28/2024   Other Diarrhea 12/25/2022   Atorvastatin  01/13/2017    Family History  Problem Relation Age of Onset   Alzheimer's disease Mother    Lung cancer Maternal Grandfather    Heart attack Paternal Grandfather    Diabetes Neg Hx    Breast cancer Neg Hx     Social History   Socioeconomic History   Marital status: Widowed    Spouse name: Not on file   Number of children: 2   Years of education: Not on file   Highest education level: Not on file  Occupational History   Occupation: Production Designer, Theatre/television/film    Comment: Retired  Tobacco Use   Smoking status: Former    Current packs/day: 0.00    Types: Cigarettes    Quit date: 2006    Years since quitting: 19.9   Smokeless tobacco: Never  Vaping Use   Vaping status: Never Used  Substance and Sexual Activity   Alcohol use: Yes    Comment:  occasional   Drug use: Never   Sexual activity: Not on file  Other Topics Concern   Not on file  Social History Narrative   Husband was [place] minister---they spent time overseas teaching   Son in [place]   Daughter in [place]         Has living will   Son is health care POA----daughter is alternate   Would accept resuscitation attempts   Doesn't want tube feeds if cognitively unaware      Per [facility] New  Patient Packet, abstracted 12/09/19      Diet: Patient states that she tries to have a diet rich in fruit, vegetables, and whole grains. But needs to modify diet because of colitis.       Caffeine: Yes      Married, if yes what year: Widowed/ Married in 1968      Do you live in a house, apartment, assisted living, [place], trailer, ect: Villa at [employer], 1 person      Pets: 1 Dog   Highest Level of Education completed? B.A.      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       Exercise: Yes, daily walking          Living Will: Yes   DNR: Yes   POA/HPOA: Yes       Functional Status:   Do you have difficulty bathing or dressing yourself? No    Do you have difficulty preparing food or eating? No   Do you have difficulty managing your medications? No    Do you have difficulty managing your finances? No    Do you have difficulty affording your medications? No   Social Drivers of Corporate Investment Banker Strain: Low Risk  (10/14/2024)   [facility]   Overall Financial Resource Strain (CARDIA)    Difficulty of Paying Living Expenses: Not hard at all  Food Insecurity: No Food Insecurity (10/14/2024)   Received from [facility]   Hunger Vital Sign    Within the past 12 months, you worried that your food would run out before you got the money to buy more.: Never true    Within the past 12 months, the food you bought just didn't  last and you didn't have money to get more.: Never true  Transportation Needs: No Transportation Needs (10/14/2024)   Received from [facility] - Transportation    In the past 12 months, has lack of transportation kept you from medical appointments or from getting medications?: No    Lack of Transportation (Non-Medical): No  Physical Activity: Not on file  Stress: Not on file  Social Connections: Not on file  Intimate Partner Violence: Not on file    Review of Systems: See HPI, otherwise negative ROS  Physical Exam: BP 139/70   Pulse 83   Temp 97.6 F (36.4 C) (Temporal)   Resp 17   Ht 5' 4 (1.626 m)   Wt 78 kg   SpO2 98%   BMI 29.52 kg/m  General:   Alert,  pleasant and cooperative in NAD Head:  Normocephalic and atraumatic. Neck:  Supple; no masses or thyromegaly. Lungs:  Clear throughout to auscultation.    Heart:  Regular rate and rhythm. Abdomen:  Soft, nontender and nondistended. Normal bowel sounds, without guarding, and without rebound.   Neurologic:  Alert and  oriented x4;  grossly normal neurologically.  Impression/Plan: Barbara Burgess is here for an colonoscopy to be performed for ulcerative colitis  Risks, benefits, limitations, and alternatives regarding  colonoscopy have been reviewed with the patient.  Questions have been answered.  All parties agreeable.   Barbara Brooklyn, MD  10/17/2024, 9:39 AM

## 2024-10-21 LAB — SURGICAL PATHOLOGY

## 2024-10-27 ENCOUNTER — Ambulatory Visit: Payer: Self-pay | Admitting: Gastroenterology

## 2024-11-13 ENCOUNTER — Ambulatory Visit (INDEPENDENT_AMBULATORY_CARE_PROVIDER_SITE_OTHER): Payer: Medicare PPO | Admitting: Vascular Surgery

## 2024-11-13 ENCOUNTER — Encounter (INDEPENDENT_AMBULATORY_CARE_PROVIDER_SITE_OTHER): Payer: Self-pay | Admitting: Vascular Surgery

## 2024-11-13 VITALS — BP 125/78 | HR 79 | Resp 18 | Ht 64.0 in | Wt 177.8 lb

## 2024-11-13 DIAGNOSIS — I10 Essential (primary) hypertension: Secondary | ICD-10-CM

## 2024-11-13 DIAGNOSIS — I890 Lymphedema, not elsewhere classified: Secondary | ICD-10-CM

## 2024-11-13 DIAGNOSIS — E785 Hyperlipidemia, unspecified: Secondary | ICD-10-CM

## 2024-11-13 DIAGNOSIS — I872 Venous insufficiency (chronic) (peripheral): Secondary | ICD-10-CM | POA: Diagnosis not present

## 2024-11-13 NOTE — Progress Notes (Signed)
 MRN : [id number]  Barbara Burgess is a 78 y.o. (1946/05/22) female who presents with chief complaint of legs swell.  History of Present Illness:   The patient returns to the office for followup evaluation regarding leg swelling.  The swelling has improved quite a bit and the pain associated with swelling has decreased substantially. There have not been any interval development of a ulcerations or wounds.  Since the previous visit the patient has been wearing graduated compression stockings, she wears Juzo graduated compression during the day and a Jobst night garment every night and has noted improvement in the lymphedema. The patient has been using compression routinely morning through night.  She has had significant problems with colitis and has been on prednisone  as well as a Mab.  This likely has contributed to some degree to increased in edema but it certainly attributed to her inability to wear the pump given the urgency of her bowel movements as well as the fullness of her abdomen.  Lately she has been significantly improved and feels she could begin using the pump again.  The patient also states elevation during the day and exercise (such as walking) is being done too.    Active Medications[1]  Past Medical History:  Diagnosis Date   Arthritis    knee and hip,  per [facility] New Patient Packet.    Bilateral bunions    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HCC)     per [facility] New Patient Packet.    Colitis    H/O CT scan of chest 11/27/2018   By Cathlean Hoyles, per psc New Patient Packet.    H/O mammogram 11/27/2018   By Ladon [facility], per psc New Patient Packet.    History of CT scan of abdomen 11/27/2018   By Cathlean Hoyles, per psc New Patient Packet.    History of upper extremity x-ray 11/27/2018   By Mira Cranker, of Shoulder, per psc New Patient Packet.    History of x-ray of hip 11/27/2018   By Dr.Kevin Krasinski, of Hip, per psc New  Patient Packet.    Hyperlipemia     per psc New Patient Packet.    Hypertension     per [facility]sc New Patient Packet.    Impingement syndrome of shoulder     per [facility] New Patient Packet.    Incontinence    Mild Nighttime per [facility]cs New Patient Packet.   Insomnia    Lymphedema    Melanoma (HCC)    right lower leg; right ovary   Melanoma of skin (HCC) 09/13/2011   09/05/2011 Exploratory laparotomy and debulking of right pelvic sidewall mass.~ 2. Bilateral salpingo-oophorectomy.~ 3. Right pelvic lymph node sampling.~~~; Path: Right pelvic sidewall mass and right fallopian tube and ovary, excision; - Metastatic malignant melanoma involving matted lymph nodes with extracapsular; extension, size: 9.0 x 7.5 x 4.5 cm; ; Braf neg; ;  Stage III or stage IV melanoma   Osteoarthritis of multiple joints    Pulmonary nodule, right 12/12/2018   See on CT chest January 2020. Pt had recent URI.     Rosacea     per [facility] New Patient Packet.    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    COLONOSCOPY  11/27/2017   By Olam Henle, per psc New Patient Packet.    COLONOSCOPY N/A 03/06/2024   Procedure: COLONOSCOPY;  Surgeon: Unk Corinn Skiff, MD;  Location: [facility] ENDOSCOPY;  Service: Gastroenterology;  Laterality: N/A;   COLONOSCOPY N/A 10/17/2024   Procedure: COLONOSCOPY;  Surgeon: Unk Corinn Skiff, MD;  Location: [facility];  Service: Endoscopy;  Laterality: N/A;   FLEXIBLE SIGMOIDOSCOPY N/A 10/16/2024   Procedure: KINGSTON SIDE;  Surgeon: Unk Corinn Skiff, MD;  Location: [facility] ENDOSCOPY;   Service: Gastroenterology;  Laterality: N/A;   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   Procedure: TOTAL KNEE ARTHROPLASTY;  Surgeon: Marchia Drivers, MD;  Location: [facility] ORS;  Service: Orthopedics;  Laterality: Left;   TUBAL LIGATION  1976    Social History Social History[2]  Family History Family History  Problem Relation Age of Onset   Alzheimer's disease Mother    Lung cancer Maternal Grandfather    Heart attack Paternal Grandfather    Diabetes Neg Hx    Breast cancer Neg Hx     Allergies[3]   REVIEW OF SYSTEMS (Negative unless checked)  Constitutional: [] Weight loss  [] Fever  [] Chills Cardiac: [] Chest pain   [] Chest pressure   [] Palpitations   [] Shortness of breath when laying flat   [] Shortness of breath with exertion. Vascular:  [] Pain in legs with walking   [x] Pain in legs with standing  [] History of DVT   [] Phlebitis   [x] Swelling in legs   [] Varicose veins   [] Non-healing ulcers Pulmonary:   [] Uses home oxygen   [] Productive cough   [] Hemoptysis   [] Wheeze  [] COPD   [] Asthma Neurologic:  [] Dizziness   [] Seizures   [] History of stroke   [] History of TIA  [] Aphasia   [] Vissual changes   [] Weakness or numbness in arm   [] Weakness or numbness in leg Musculoskeletal:   [] Joint swelling   [] Joint pain   [] Low back pain Hematologic:  [] Easy bruising  [] Easy bleeding   [] Hypercoagulable state   [] Anemic Gastrointestinal:  [] Diarrhea   [] Vomiting  [] Gastroesophageal reflux/heartburn   [] Difficulty swallowing. Genitourinary:  [] Chronic kidney disease   [] Difficult urination  [] Frequent urination   [] Blood in urine Skin:  [] Rashes   [] Ulcers  Psychological:  [] History of anxiety   []  History of  major depression.  Physical Examination  There were no vitals filed for this visit. There is no height or weight on file to calculate BMI. Gen: WD/WN, NAD Head: Bee/AT, No temporalis wasting.  Ear/Nose/Throat: Hearing grossly intact, nares w/o erythema or drainage, pinna without lesions Eyes: PER, EOMI, sclera nonicteric.  Neck: Supple, no gross masses.  No JVD.  Pulmonary:  Good air movement, no audible wheezing, no use of accessory muscles.  Cardiac: RRR, precordium not hyperdynamic. Vascular:  scattered varicosities present bilaterally.  Mild venous stasis  changes to the legs bilaterally.  2+ soft pitting edema, CEAP [id number]  Vessel Right Left  Radial Palpable Palpable  Gastrointestinal: soft, non-distended. No guarding/no peritoneal signs.  Musculoskeletal: M/S 5/5 throughout.  No deformity.  Neurologic: CN 2-12 intact. Pain and light touch intact in extremities.  Symmetrical.  Speech is fluent. Motor exam as listed above. Psychiatric: Judgment intact, Mood & affect appropriate for pt's clinical situation. Dermatologic: Venous rashes no ulcers noted.  No changes consistent with cellulitis. Lymph : No lichenification or skin changes of chronic lymphedema.  CBC Lab Results  Component Value Date   WBC 6.9 01/30/2024   HGB 14.9 01/30/2024   HCT 44.9 01/30/2024   MCV 93 01/30/2024   PLT 238 01/30/2024    BMET    Component Value Date/Time   NA 139 01/30/2024 0958   K 4.6 01/30/2024 0958   CL 104 01/30/2024 0958   CO2 22 01/30/2024 0958   GLUCOSE 85 01/30/2024 0958   GLUCOSE 157 (H) 12/21/2021 0411   BUN 14 01/30/2024 0958   CREATININE 0.82 01/30/2024 0958   CALCIUM  9.6 01/30/2024 0958   GFRNONAA >60 12/21/2021 0411   GFRAA 76 08/12/2020 0000   CrCl cannot be calculated (Patient's most recent lab result is older than the maximum 21 days allowed.).  COAG Lab Results  Component Value Date   INR 0.9 12/09/2021    Radiology No results found.   Assessment/Plan 1.  Lymphedema (Primary) Recommend:  No surgery or intervention at this point in time.    I have reviewed my discussion with the patient regarding lymphedema and why it  causes symptoms.  Patient will continue wearing graduated compression on a daily basis. The patient should put the compression on first thing in the morning and removing them in the evening. The patient should not sleep in the compression.   In addition, behavioral modification throughout the day will be continued.  This will include frequent elevation (such as in a recliner), use of over the counter pain medications as needed and exercise such as walking.  The systemic causes for chronic edema such as liver, kidney and cardiac etiologies does not appear to have significant changed over the past year.    The patient will continue aggressive use of the  lymph pump.  This will continue to improve the edema control and prevent sequela such as ulcers and infections.   The patient will follow-up with me on a PRN basis.   2. Chronic venous insufficiency Recommend:  No surgery or intervention at this point in time.    I have reviewed my discussion with the patient regarding lymphedema and why it  causes symptoms.  Patient will continue wearing graduated compression on a daily basis. The patient should put the compression on first thing in the morning and removing them in the evening. The patient should not sleep in the compression.   In addition, behavioral modification throughout the day will be continued.  This will include frequent elevation (such as in a recliner), use of over the counter pain medications as needed and exercise such as walking.  The systemic causes for chronic edema such as liver, kidney and cardiac etiologies does not appear to have significant changed over the past year.    The patient will continue aggressive use of the  lymph pump.  This will continue to improve the edema control and prevent sequela such as  ulcers and infections.   The patient will follow-up with me on a PRN basis.  3. Primary hypertension Continue antihypertensive medications as already ordered, these medications have been reviewed and there are no changes at this time.  4. Hyperlipidemia, unspecified hyperlipidemia type Continue statin as ordered and reviewed, no changes at this time    Cordella Shawl, MD  11/13/2024 12:45 PM      [1]  No outpatient medications have been marked as taking for the 11/13/24 encounter (Appointment) with Shawl, Cordella MATSU, MD.  [2]  Social History Tobacco Use   Smoking status: Former    Current packs/day: 0.00    Average packs/day: 1.0 packs/day    Types: Cigarettes    Quit date: 2006    Years since quitting: 19.9   Smokeless tobacco: Never  Vaping Use   Vaping status: Never Used  Substance Use Topics   Alcohol use: Yes    Comment: occasional   Drug use: Never  [3]  Allergies Allergen Reactions   Lactose Diarrhea   Other Diarrhea   Atorvastatin     Elevated Liver Enzymes.

## 2025-03-09 ENCOUNTER — Ambulatory Visit
